# Patient Record
Sex: Female | Born: 1935 | Race: White | Hispanic: No | Smoking: Never smoker
Health system: Southern US, Community
[De-identification: ages and names within clinical notes are randomized; demographics above are authoritative.]

## PROBLEM LIST (undated history)

## (undated) DIAGNOSIS — M1990 Unspecified osteoarthritis, unspecified site: Secondary | ICD-10-CM

## (undated) DIAGNOSIS — E785 Hyperlipidemia, unspecified: Secondary | ICD-10-CM

## (undated) DIAGNOSIS — N1832 Chronic kidney disease, stage 3b: Secondary | ICD-10-CM

## (undated) DIAGNOSIS — E7800 Pure hypercholesterolemia, unspecified: Secondary | ICD-10-CM

## (undated) DIAGNOSIS — E559 Vitamin D deficiency, unspecified: Secondary | ICD-10-CM

## (undated) DIAGNOSIS — I10 Essential (primary) hypertension: Secondary | ICD-10-CM

## (undated) DIAGNOSIS — G479 Sleep disorder, unspecified: Secondary | ICD-10-CM

## (undated) DIAGNOSIS — K219 Gastro-esophageal reflux disease without esophagitis: Secondary | ICD-10-CM

## (undated) DIAGNOSIS — R011 Cardiac murmur, unspecified: Secondary | ICD-10-CM

## (undated) DIAGNOSIS — I350 Nonrheumatic aortic (valve) stenosis: Secondary | ICD-10-CM

## (undated) HISTORY — DX: Chronic kidney disease, stage 3b: N18.32

## (undated) HISTORY — DX: Unspecified osteoarthritis, unspecified site: M19.90

## (undated) HISTORY — PX: BACK SURGERY: SHX140

## (undated) HISTORY — DX: Cardiac murmur, unspecified: R01.1

## (undated) HISTORY — DX: Pure hypercholesterolemia, unspecified: E78.00

## (undated) HISTORY — DX: Sleep disorder, unspecified: G47.9

## (undated) HISTORY — DX: Vitamin D deficiency, unspecified: E55.9

## (undated) HISTORY — DX: Nonrheumatic aortic (valve) stenosis: I35.0

## (undated) HISTORY — PX: SHOULDER SURGERY: SHX246

## (undated) HISTORY — PX: HAND SURGERY: SHX662

## (undated) HISTORY — PX: CARPAL TUNNEL RELEASE: SHX101

---

## 1998-01-26 ENCOUNTER — Encounter: Admission: RE | Admit: 1998-01-26 | Discharge: 1998-04-26 | Payer: Self-pay | Admitting: Anesthesiology

## 1998-05-03 ENCOUNTER — Encounter: Admission: RE | Admit: 1998-05-03 | Discharge: 1998-08-01 | Payer: Self-pay | Admitting: Anesthesiology

## 1998-11-15 ENCOUNTER — Other Ambulatory Visit: Admission: RE | Admit: 1998-11-15 | Discharge: 1998-11-15 | Payer: Self-pay | Admitting: Obstetrics and Gynecology

## 1999-12-05 ENCOUNTER — Other Ambulatory Visit: Admission: RE | Admit: 1999-12-05 | Discharge: 1999-12-05 | Payer: Self-pay | Admitting: Obstetrics and Gynecology

## 1999-12-31 ENCOUNTER — Emergency Department (HOSPITAL_COMMUNITY): Admission: EM | Admit: 1999-12-31 | Discharge: 2000-01-01 | Payer: Self-pay | Admitting: Emergency Medicine

## 2000-11-26 ENCOUNTER — Encounter: Payer: Self-pay | Admitting: Orthopedic Surgery

## 2000-12-01 ENCOUNTER — Inpatient Hospital Stay (HOSPITAL_COMMUNITY): Admission: RE | Admit: 2000-12-01 | Discharge: 2000-12-03 | Payer: Self-pay | Admitting: Orthopedic Surgery

## 2001-01-16 ENCOUNTER — Other Ambulatory Visit: Admission: RE | Admit: 2001-01-16 | Discharge: 2001-01-16 | Payer: Self-pay | Admitting: Obstetrics and Gynecology

## 2002-01-26 ENCOUNTER — Other Ambulatory Visit: Admission: RE | Admit: 2002-01-26 | Discharge: 2002-01-26 | Payer: Self-pay | Admitting: Obstetrics and Gynecology

## 2003-10-07 ENCOUNTER — Encounter: Admission: RE | Admit: 2003-10-07 | Discharge: 2003-10-07 | Payer: Self-pay | Admitting: Orthopedic Surgery

## 2003-10-27 ENCOUNTER — Inpatient Hospital Stay (HOSPITAL_COMMUNITY): Admission: RE | Admit: 2003-10-27 | Discharge: 2003-10-28 | Payer: Self-pay | Admitting: Orthopedic Surgery

## 2004-01-12 ENCOUNTER — Encounter: Admission: RE | Admit: 2004-01-12 | Discharge: 2004-01-12 | Payer: Self-pay | Admitting: Orthopedic Surgery

## 2004-01-31 ENCOUNTER — Encounter: Admission: RE | Admit: 2004-01-31 | Discharge: 2004-01-31 | Payer: Self-pay | Admitting: Orthopedic Surgery

## 2004-02-01 ENCOUNTER — Ambulatory Visit (HOSPITAL_BASED_OUTPATIENT_CLINIC_OR_DEPARTMENT_OTHER): Admission: RE | Admit: 2004-02-01 | Discharge: 2004-02-01 | Payer: Self-pay | Admitting: Orthopedic Surgery

## 2004-02-01 ENCOUNTER — Ambulatory Visit (HOSPITAL_COMMUNITY): Admission: RE | Admit: 2004-02-01 | Discharge: 2004-02-01 | Payer: Self-pay | Admitting: Orthopedic Surgery

## 2004-04-17 ENCOUNTER — Encounter: Admission: RE | Admit: 2004-04-17 | Discharge: 2004-04-17 | Payer: Self-pay | Admitting: Orthopedic Surgery

## 2004-05-10 ENCOUNTER — Inpatient Hospital Stay (HOSPITAL_COMMUNITY): Admission: RE | Admit: 2004-05-10 | Discharge: 2004-05-11 | Payer: Self-pay | Admitting: Orthopedic Surgery

## 2004-09-17 ENCOUNTER — Encounter: Admission: RE | Admit: 2004-09-17 | Discharge: 2004-09-17 | Payer: Self-pay | Admitting: Orthopedic Surgery

## 2004-12-19 ENCOUNTER — Observation Stay (HOSPITAL_COMMUNITY): Admission: RE | Admit: 2004-12-19 | Discharge: 2004-12-20 | Payer: Self-pay | Admitting: Orthopedic Surgery

## 2005-03-15 ENCOUNTER — Encounter: Admission: RE | Admit: 2005-03-15 | Discharge: 2005-03-15 | Payer: Self-pay | Admitting: Orthopedic Surgery

## 2005-04-27 HISTORY — PX: HIP SURGERY: SHX245

## 2005-08-06 ENCOUNTER — Ambulatory Visit (HOSPITAL_COMMUNITY): Admission: RE | Admit: 2005-08-06 | Discharge: 2005-08-07 | Payer: Self-pay | Admitting: Orthopedic Surgery

## 2005-09-06 ENCOUNTER — Ambulatory Visit (HOSPITAL_COMMUNITY): Admission: RE | Admit: 2005-09-06 | Discharge: 2005-09-06 | Payer: Self-pay | Admitting: Orthopedic Surgery

## 2005-12-10 ENCOUNTER — Inpatient Hospital Stay (HOSPITAL_COMMUNITY): Admission: RE | Admit: 2005-12-10 | Discharge: 2005-12-13 | Payer: Self-pay | Admitting: Orthopedic Surgery

## 2005-12-10 ENCOUNTER — Ambulatory Visit: Payer: Self-pay | Admitting: Physical Medicine & Rehabilitation

## 2005-12-13 ENCOUNTER — Inpatient Hospital Stay
Admission: RE | Admit: 2005-12-13 | Discharge: 2005-12-19 | Payer: Self-pay | Admitting: Physical Medicine & Rehabilitation

## 2006-06-17 ENCOUNTER — Encounter: Admission: RE | Admit: 2006-06-17 | Discharge: 2006-06-17 | Payer: Self-pay | Admitting: Orthopedic Surgery

## 2006-07-30 ENCOUNTER — Ambulatory Visit (HOSPITAL_COMMUNITY): Admission: RE | Admit: 2006-07-30 | Discharge: 2006-07-31 | Payer: Self-pay | Admitting: Orthopedic Surgery

## 2006-12-08 ENCOUNTER — Encounter: Admission: RE | Admit: 2006-12-08 | Discharge: 2006-12-08 | Payer: Self-pay | Admitting: Family Medicine

## 2006-12-08 ENCOUNTER — Encounter (INDEPENDENT_AMBULATORY_CARE_PROVIDER_SITE_OTHER): Payer: Self-pay | Admitting: Specialist

## 2006-12-08 ENCOUNTER — Encounter (INDEPENDENT_AMBULATORY_CARE_PROVIDER_SITE_OTHER): Payer: Self-pay | Admitting: *Deleted

## 2006-12-18 ENCOUNTER — Inpatient Hospital Stay (HOSPITAL_COMMUNITY): Admission: RE | Admit: 2006-12-18 | Discharge: 2006-12-24 | Payer: Self-pay | Admitting: Orthopedic Surgery

## 2006-12-19 ENCOUNTER — Ambulatory Visit: Payer: Self-pay | Admitting: Physical Medicine & Rehabilitation

## 2007-06-26 ENCOUNTER — Ambulatory Visit (HOSPITAL_BASED_OUTPATIENT_CLINIC_OR_DEPARTMENT_OTHER): Admission: RE | Admit: 2007-06-26 | Discharge: 2007-06-26 | Payer: Self-pay | Admitting: Orthopedic Surgery

## 2007-09-15 ENCOUNTER — Encounter: Admission: RE | Admit: 2007-09-15 | Discharge: 2007-09-15 | Payer: Self-pay | Admitting: Orthopedic Surgery

## 2008-06-01 ENCOUNTER — Inpatient Hospital Stay (HOSPITAL_COMMUNITY): Admission: RE | Admit: 2008-06-01 | Discharge: 2008-06-04 | Payer: Self-pay | Admitting: Orthopedic Surgery

## 2008-07-18 ENCOUNTER — Encounter: Admission: RE | Admit: 2008-07-18 | Discharge: 2008-07-18 | Payer: Self-pay | Admitting: Orthopedic Surgery

## 2009-08-03 ENCOUNTER — Ambulatory Visit (HOSPITAL_COMMUNITY): Admission: RE | Admit: 2009-08-03 | Discharge: 2009-08-05 | Payer: Self-pay | Admitting: Orthopedic Surgery

## 2010-02-09 ENCOUNTER — Encounter: Admission: RE | Admit: 2010-02-09 | Discharge: 2010-02-09 | Payer: Self-pay | Admitting: Orthopedic Surgery

## 2010-11-17 ENCOUNTER — Encounter: Payer: Self-pay | Admitting: Orthopedic Surgery

## 2010-11-18 ENCOUNTER — Encounter: Payer: Self-pay | Admitting: Family Medicine

## 2011-02-01 LAB — CBC
HCT: 35.4 % — ABNORMAL LOW (ref 36.0–46.0)
Hemoglobin: 11.7 g/dL — ABNORMAL LOW (ref 12.0–15.0)
MCHC: 33.1 g/dL (ref 30.0–36.0)
MCV: 90.6 fL (ref 78.0–100.0)
RBC: 3.91 MIL/uL (ref 3.87–5.11)
RDW: 14.2 % (ref 11.5–15.5)

## 2011-02-01 LAB — BASIC METABOLIC PANEL
CO2: 29 mEq/L (ref 19–32)
Calcium: 9.8 mg/dL (ref 8.4–10.5)
Chloride: 105 mEq/L (ref 96–112)
Glucose, Bld: 115 mg/dL — ABNORMAL HIGH (ref 70–99)
Potassium: 4.7 mEq/L (ref 3.5–5.1)
Sodium: 141 mEq/L (ref 135–145)

## 2011-03-12 NOTE — Op Note (Signed)
[name], [name]               ACCOUNT NO.:  [id number]   MEDICAL RECORD NO.:  [id number]          PATIENT TYPE:  INP   LOCATION:  0003                         FACILITY:  [facility]   PHYSICIAN:  Marlowe Kays, M.D.  DATE OF BIRTH:  [birth year]   DATE OF PROCEDURE:  06/01/2008  DATE OF DISCHARGE:                               OPERATIVE REPORT   PREOPERATIVE DIAGNOSES:  1. Central and foraminal stenosis, L2-3, L3-4, L4-5.  2. Possible disk herniations, L2-3 and L4-5.   POSTOPERATIVE DIAGNOSES:  1. Central and foraminal stenosis, L2-3, L3-4, L4-5.  2. No definite disk herniation identified.   OPERATION:  Central and foraminal decompression, L2-3, L3-4, L4-5, with  inspection of the L2-3 and L4-5 disk on the left.   SURGEON:  Dr. Simonne Come.   ASSISTANT:  Dr. Worthy Rancher.   ANESTHESIA:  General.   PATHOLOGY/JUSTIFICATION FOR PROCEDURE:  She has had bilateral total  hips.  She is having bilateral leg pain out, emanating from her back  with an MRI in November of last year demonstrating multifactorial spinal  stenosis at 3 levels, demonstrated particularly at L4-5, with possible  disk herniations at L2-3 and L4-5.   PROCEDURE:  Prophylactic antibiotics, satisfactory general anesthesia,  Foley catheter inserted carefully protecting the total hips, placed in  the prone position on the Wilson frame.  Back was prepped with DuraPrep,  draped in sterile field, Ioban employed, time out performed.  I made a  vertical midline incision and tagged it to adjacent spinous processes in  the mid lumbar area with Kocher clamps.  Initial lateral x-ray  demonstrated these were on the spinous process of L2 and L3.  Based on  this, I extended the incision distal ward and dissected soft tissue off  the lamina of L2 to L5.  I then placed self-retaining McCullough  retractors and began removing the spinous processes and neural arches at  the these levels.  I then used 2- and 3-mm Kerrison rongeurs to  remove a  substantial amount of residual bone and ligamentum flavum and then  brought in the microscope to complete the decompression from L2 to L5.  The spinal canal was widely patent to hockey-stick both proximally and  distally, and all the foramina were patent.  We then looked at the L2-3  disk on the left, removing some additional lateral bone.  Left side was  more symptomatic than her right.  The disk appeared firm to palpation  with the [facility] 4 instrument.  The neural foramen was widely patent  for the L3 nerve root.  Distally, we thoroughly exposed the L5 nerve  root and performed a wide foraminotomy.  The dura was very intimate to  the surrounding lateral tissue, and we were fearful of creating a dural  tear with vigorous attempts to expose the L4-5 disk.  We identified the  correct level with a third x-ray.  It was felt that the dura and the  foramina were well decompressed, and not wishing to create any  iatrogenic problems, we called it a day at this point.  The wound was  thoroughly irrigated  with sterile saline and Gelfoam soaked in thrombin  was placed over the dura.  Then placed a quarter-inch Penrose drain from  the right posterior flank, up the buttock area, into the wound overlying  the dura and Gelfoam.  We then closed the wound in layers working under  direct visualization around the drain with interrupted #1 Vicryl in the  paralumbar muscle and fascia, 2-0 Vicryl in  the subcutaneous tissue and staples in the skin.  Betadine, Adaptic, dry  sterile dressing were applied.  She tolerated the procedure well and was  taken to recovery in satisfactory condition with no known complications.  Estimated blood loss was 225 mL, no blood replacement.           ______________________________  Marlowe Kays, M.D.     JA/MEDQ  D:  06/01/2008  T:  06/01/2008  Job:  [id number]

## 2011-03-12 NOTE — Op Note (Signed)
[name], [name]               ACCOUNT NO.:  [id number]   MEDICAL RECORD NO.:  [id number]          PATIENT TYPE:  AMB   LOCATION:  NESC                         FACILITY:  [facility]   PHYSICIAN:  Marlowe Kays, M.D.  DATE OF BIRTH:  03/30/1927   DATE OF PROCEDURE:  06/26/2007  DATE OF DISCHARGE:                               OPERATIVE REPORT   PREOPERATIVE DIAGNOSIS:  Right carpal tunnel syndrome.   POSTOPERATIVE DIAGNOSIS:  Right carpal tunnel syndrome.   OPERATION:  Decompression median nerve, right wrist and hand.   SURGEON:  Marlowe Kays, M.D.   ASSISTANT:  Nurse.   ANESTHESIA:  IV regional supplemented by local Xylocaine.   JUSTIFICATION FOR PROCEDURE:  She has signs and symptoms of carpal  tunnel syndrome with positive nerve conduction studies for it. She has  had successful carpal tunnel release by me on the left side years ago.   PROCEDURE:  After satisfactory regional anesthesia, which I had  supplemented with some local 1% Xylocaine, the right upper extremity was  prepped with DuraPrep from the mid forearm to the fingertips and draped  in sterile field.  A time out was performed.  I marked out a curved  incision along the base of thenar eminence crossing obliquely over the  flexor crease of the wrist in the distal forearm.  The palmaris longus  tendon was identified and retracted to the radial side.  Beneath it, the  median nerve was identified. It was quite bulbous.  I carefully released  the skin and subcutaneous tissue to very thick fascia in the mid palm,  working down into the distal palm, releasing fibrous bands on some of  several of the nerve branches in the distal palm.  The potential  bleeders were coagulated with bipolar cautery.  At the conclusion of the  case, the wound was irrigated well with sterile saline and skin and  subcutaneous tissue only closed with interrupted 4-0 nylon mattress  sutures.  Betadine, Adaptic, and dry, sterile dressing were  applied  followed by a volar plaster splint.  She tolerated the procedure well  and was taken to the recovery room in satisfactory condition with no  known complications.           ______________________________  Marlowe Kays, M.D.     JA/MEDQ  D:  06/26/2007  T:  06/27/2007  Job:  [id number]

## 2011-03-12 NOTE — H&P (Signed)
[name], [name]               ACCOUNT NO.:  [id number]   MEDICAL RECORD NO.:  [id number]         PATIENT TYPE:  LINP   LOCATION:                               FACILITY:  [facility]   PHYSICIAN:  Marlowe Kays, M.D.  DATE OF BIRTH:  Dec 28, 1926   DATE OF ADMISSION:  06/01/2008  DATE OF DISCHARGE:                              HISTORY & PHYSICAL   CHIEF COMPLAINT:  Pain in my back and legs.   PRESENT ILLNESS:  This is a 77 year old white female seen by [place] for  continuing problems concerning pain into her lumbar spine.  She has  radiation into the lower extremities which varies from right leg to left  leg.  Standing for any period of time markedly increases her pain and  discomfort, and now it is interfering with her sleep patterns due to the  pain.  She has to get up and walk around from time to time due to the  pain in her back.  We have tried to treat her conservatively with rest  and some mild exercises, but it was felt that we should go straight  ahead to an MRI to rule out any marked discopathy or stenosis.  Unfortunately the MRI showed severe multifactorial spinal stenosis at L4-  L5, L2-L3 with a disk protrusion and narrowing of the lateral recess,  more so on the left than right.  She had facet arthropathy at L5 with  narrowing of the subarticular lateral recess.  Please see MRI report for  further details.  After much discussion including the risks and benefits  of surgery, it is felt she would benefit from surgical intervention and  is scheduled for a decompressive lumbar laminectomy from L2-L5 with  possible microdiskectomy L2-L3, L4-L5.   PAST MEDICAL HISTORY:  The patient is being treated for hypertension,  hypercholesterolemia.   PAST SURGICAL EXPERIENCES:  1. Two hand surgeries.  2. Cholecystectomy.  3. Bilateral hip replacements.  4. Several surgeries on her shoulders for rotator cuff problems.   Dr. Tenny Craw at [place], [employer] is her family physician.   SHE  IS ALLERGIC TO CODEINE, HAS NO FOOD, LATEX OR METAL ALLERGIES.   CURRENT MEDICATIONS:  1. Zocor 20  mg daily.  2. Norvasc 5 mg daily.  3. Diovan 160/12.5 mg daily.   FAMILY HISTORY:  Positive for father dying of a heart attack at 76 years  of age and mother with lung cancer at 71 years of age.  Two siblings  have expired secondary to heart attack.   SOCIAL HISTORY:  The patient is widowed.  She never had any intake of  tobacco products, has no alcohol products at the current time.  The  patient lives alone, but has family support and a friend who will help  her after surgery.   REVIEW OF SYSTEMS:  CNS: No seizures, stroke or paralysis, numbness,  double vision.  RESPIRATORY:  No productive cough, no hemoptysis, no  shortness of breath.  CARDIOVASCULAR: No chest pain, angina or  orthopnea.  GASTROINTESTINAL:  No nausea, vomiting, melena or bloody  stool.  GENITOURINARY:  No discharge,  dysuria or hematuria.  MUSCULOSKELETAL:  Primarily in present illness with her back and legs.   PHYSICAL EXAMINATION:  A cooperative and friendly 77 year old white  female whose vital signs are:  Blood pressure 122/64, pulse 72 regular,  respirations 12 unlabored.  HEENT: Normocephalic.  Wears glasses.  PERRLA, EOM intact.  Oropharynx  is clear.  CHEST:  Clear to auscultation.  No rhonchi, rales or wheezes.  HEART:  Regular rate and rhythm.  No murmurs are heard.  ABDOMEN: Soft, nontender, spleen not felt.  Bowel sounds present.  GENITALIA/RECTAL/PELVIC/BREASTS:  Not done.  Not pertinent to present  illness.  EXTREMITIES:  Negative straight leg raise bilaterally and sensory is  grossly intact to lower extremities.   ADMISSION DIAGNOSES:  1. Spinal stenosis L2-L3, L3-L4, L4-L5 with HNP at L2-L3, L4-L5.  2. Hypertension.  3. Hypercholesterolemia.   PLAN:  The patient will undergo decompressive lumbar laminectomy from L2-  L5 with possible microdiskectomy in L2-L3, L4-L5.  In all probability  she  will be able to go home with home physical therapy and assistance  after her regular hospitalization.      Dooley L. Cherlynn June.    ______________________________  Marlowe Kays, M.D.    DLU/MEDQ  D:  05/19/2008  T:  05/19/2008  Job:  [id number]   cc:   Marlowe Kays, M.D.  Fax: [phone]   C. Duane Lope, M.D.  Fax: [phone]

## 2011-03-15 NOTE — Op Note (Signed)
[name], [name]               ACCOUNT NO.:  [id number]   MEDICAL RECORD NO.:  [id number]          PATIENT TYPE:  INP   LOCATION:  1610                         FACILITY:  [facility]   PHYSICIAN:  Marlowe Kays, M.D.  DATE OF BIRTH:  08-16-1927   DATE OF PROCEDURE:  12/18/2006  DATE OF DISCHARGE:                               OPERATIVE REPORT   PREOPERATIVE DIAGNOSIS:  Osteoarthritis right hip.   POSTOPERATIVE DIAGNOSIS:  Osteoarthritis right hip.   OPERATION:  Osteonics total hip replacement, right.   SURGEON:  Dr. Simonne Come   ASSISTANT:  Dr. Worthy Rancher.   ANESTHESIA:  General.   PATHOLOGY AND JUSTIFICATION FOR PROCEDURE:  She has had a total hip on  the left, and this is doing well.  She has had progressive  osteoarthritic change in the right hip with debilitating pain and  consequently is here today for the above-mentioned surgery.   PROCEDURE:  Prophylactic antibiotic, satisfactory general anesthesia,  Foley catheter inserted, left lateral decubitus position on the Aurora II  frame.  Right hip was prepped with DuraPrep, draped in a sterile field,  Ioban employed.  Posterolateral incision down to the fascia lata.  Greater trochanteric bursal tissue was excised.  Zickel's band was cut.  External rotators were detached from the lateral femur and the femoral  head exposed.  The hip was dislocated, and I amputated the femoral neck  right below the femoral head.  I then cleared the piriformis fossa of  soft tissue placed a guidepin down through it into the femoral canal.  I  then over drilled the guide pin and began the vertical reaming process  working up to #8 which is the size she had on her left side.  Along the  way, I used the greater trochanteric reamer, and I also made my final  cut on the femoral neck 1 fingerbreadth above the lesser trochanter.  I  then began the rasping process which likewise went up 2 and the 8.  I  then reamed for the distal tip for the femoral  component going up to a  14 mm size.  Despite her age of 32, she had excellent cortical bone, and  I felt the noncemented stem would be appropriate and had clearly worked  well on the contralateral side.  I then used the gold broach for the  final preparation for the Press-Fit size 8 femoral component.  I then  went to the acetabulum, clearing it of soft tissue and capsule around  the perimeter.  I then began deepening and expanding reaming up to a  size 56.  She had a 54 on the left side.  This seemed to be the best  fit.  I went through a trial reduction with good position of the cup and  consequently placed the final cup, stabilizing with 2 screws then went  through another trial reduction with a trial insert with excellent range  of motion and placed a final 54, 56 insert for a 36 mm head.  The size 8  Press-Fit femoral component was then inserted and 2 trial reductions  with  a +0 and +5 neck indicating that the +5 was the best fit with no  toggle and nice stability in all directions.  Consequently the final +5  femoral head and neck was placed, the hip reduced, found to be stable.  The wound was then closed after irrigating the wound well.  The Zickel's  band was reattached with interrupted #1 Vicryl.  The external rotators,  fascia lata with the same,  subcutaneous tissue with a combination of 0 and 2-0 Vicryl, staples in  the skin.  Betadine, Adaptic dry sterile dressing were applied.  She was  gently placed on her back in an abduction pillow and taken to recovery  in satisfactory condition with no known complications.  EBL was perhaps  300 mL, no blood replacement.           ______________________________  Marlowe Kays, M.D.     JA/MEDQ  D:  12/18/2006  T:  12/18/2006  Job:  [id number]

## 2011-03-15 NOTE — Discharge Summary (Signed)
[name], [name]               ACCOUNT NO.:  [id number]   MEDICAL RECORD NO.:  [id number]          PATIENT TYPE:  INP   LOCATION:  1612                         FACILITY:  [facility]   PHYSICIAN:  Marlowe Kays, M.D.  DATE OF BIRTH:  04-20-1927   DATE OF ADMISSION:  06/01/2008  DATE OF DISCHARGE:  06/04/2008                               DISCHARGE SUMMARY   ADMITTING DIAGNOSES:  1. Spinal stenosis L2-L3, L3-L4, L4-L5 with herniated nucleus pulposus      at L2-L3 and L4-L5.  2. Hypertension.  3. Hypercholesterolemia.   DISCHARGE DIAGNOSES:  1. Central and foraminal stenosis L2-L3, L3-L4, L4-L5 with no definite      disk herniation identified.  2. Hypertension.  3. Hypercholesterolemia.   OPERATION:  On June 01, 2008 the patient underwent central foraminal  decompression L2-L3, L3-L4, L4-L5 with inspection of the L2-L3 and L4-5  disk on the left.  Dr. Ranee Gosselin assisted.   BRIEF HISTORY:  This 77 year old white female well known to [place] has had  progressive problems concerning pain into her back to with radiation to  lower extremities.  Standing increase her pain and discomfort.  She has  difficulty sleeping due to the fact she has to get up and walk around  from time to time.  Exercises it had were given with a very brittle  results and MRI showed the above pathology.  After much discussion  including risks and benefits of surgery she was scheduled for the above  procedure.   COURSE IN THE HOSPITAL:  The patient tolerated surgical procedure quite  well.  She had a mild syncopal episode postop with physical therapy.  She did have a drop in hemoglobin but was not to the point where she  needed transfusion.  She was 11.8 preop and 8.6 postop.  She eventually  had no more syncopal episodes and we attributed it to some of her pain  medications and anesthesia or the postop anesthesia effects.  She worked  diligently with physical therapy the drain which was placed in her back  at the time of surgery was advanced on the first postop day and then  DC'd on the second postop day.  She was doing quite well on the day of  discharge, had markedly less pain in her lower extremities had a mild  temperature but was using pulmonary assist.  She was felt that she could  be maintained in her home environment.  She had minimal drainage to her  back and was neurovascular intact in lower extremities as she had as  well very little leg discomfort.   LABORATORY VALUES IN THE HOSPITAL:  Showed a preoperative H&H of 11.8,  hematocrit was 34.6 on August 07, hemoglobin was 8.6 matter of 25.6.  Electrocardiogram showed sinus rhythm with premature supraventricular  complexes.  Chest x-ray showed no acute disease.   CONDITION ON DISCHARGE:  Improved, stable.   PLAN:  The patient discharged to home care of her family.  Continue with  diet as tolerated at home and can ambulate ad lib with and without  walker.  Use dry dressings  to back as needed.  Avoid any bending or  twisting.  She is given Demerol tabs for discomfort, Robaxin as a muscle  relaxant.  Return to see [place] about 2 weeks after date of surgery.  She is  encouraged to call us should she have any problems or questions while at  home.      Dooley L. Cherlynn June.    ______________________________  Marlowe Kays, M.D.    DLU/MEDQ  D:  06/22/2008  T:  06/22/2008  Job:  [id number]   cc:   C. Duane Lope, M.D.  Fax: [phone]   Marlowe Kays, M.D.  Fax: [phone]

## 2011-03-15 NOTE — Op Note (Signed)
[name], [name]               ACCOUNT NO.:  [id number]   MEDICAL RECORD NO.:  [id number]          PATIENT TYPE:  INP   LOCATION:  1509                         [facility]ACILITY:  [facility]   PHYSICIAN:  Marlowe Kays, M.D.  DATE OF BIRTH:  [birth year]   DATE OF PROCEDURE:  12/10/2005  DATE OF DISCHARGE:                                 OPERATIVE REPORT   PREOPERATIVE DIAGNOSIS:  Osteoarthritis, left hip.   POSTOPERATIVE DIAGNOSIS:  Osteoarthritis, left hip.   OPERATION:  Osteonics total hip replacement, left.   SURGEON:  Marlowe Kays, M.D.   ASSISTANT:  Georges Lynch. Darrelyn Hillock, M.D.   ANESTHESIA:  General.   PATHOLOGY AND JUSTIFICATION FOR PROCEDURE:  Both hips looked fairly  identical with an oblong femoral head and oblong acetabulum. Only her left  hip was painful particularly with no superior joint space remaining. She has  failed to respond to nonsurgical treatment.   DESCRIPTION OF PROCEDURE:  Prophylactic antibiotics, satisfactory general  anesthesia, Foley catheter inserted, right lateral decubitus position with  the left hip prepped with DuraPrep, draped in a sterile field.  Posterolateral incision after application of Ioban was sent down through the  fascia lata which was distorted because of a chronic trochanteric bursitis.  A good bit of thick bursal tissue was excised. She had bony deformities of  the greater trochanter which I also smoothed down with a rongeur. The  external rotators were detached from the femur and hip capsule identified  and a subtotal capsulectomy performed. After dislocating the hip, I  amputated the femoral neck below the femoral head and then finished clearing  the piriformis fossa of soft tissue. I placed a guidepin down through it  into the femur and over drilled it followed by the canal finder. I then  began the cylindrical reaming process. She was age 77 but both  radiographically and at surgery had excellent bone stock and both Dr.  Darrelyn Hillock and  I felt that her bone was too good to warrant a cemented femoral  component. Accordingly we reamed thinking in terms of the Press-Fit. We used  the greater trochanteric reamer and working our way up to a #8 cylindrical  reamer made our final definitive cut on the femoral neck. I then continued  with the rasping up to a #8. At this point, there no cancellus bone  remaining on the calcar. I could recess the 7 rasp bit the 8 rasp I could  not completely bring 100% onto the calcar. We felt this was an excellent  fit. I then reamed for the 12 mm distal femoral tip up to a 12.5.  I went  through a trial reduction with the gold broach with no problem.  We then  went to the acetabulum where I completed the capsulectomy. She had a very  deep foramen Ovale as well as an eccentric acetabulum requiring a good bit  of deepening to the level of the acetabular wall and then some differential  reaming medially to make a more spherical acetabulum. We worked our way up  to 52 reamer and then utilized a 54 trial finding  this to fit nicely. After  going through a trial reduction, we went ahead and placed a final 54 Trident  hemispherical acetabular shell which I stabilized with two screws which  individually drilled, measured and screwed. We then placed the final 10  degree acetabular liner set at about 2 o'clock. I then inserted the final  SecureFit 127 degree size 8 femoral component and went through trial  reductions with it finding +0 was the best fit in terms of tightness and  stability on rotation, it was very stable. When the final 36 mm C tapered  head was placed, the hip was reduced and found to be nice and stable. I then  irrigated the wound well with sterile saline and reattached the external  rotators with interrupted #1 Vicryl followed by the anomalous appearing  fascia lata. The subcutaneous tissue was closed with 2-0 Vicryl, skin with  staples. Betadine Adaptic dry sterile dressing were applied.  She was gently  placed on her back in an abduction pillow. Leg lengths appeared to be  roughly equal, foot was up, and she was dorsiflexing her foot. She was taken  to the recovery room in satisfactory condition with no known complications.  Estimated blood loss was 400 mL, no blood replacement.           ______________________________  Marlowe Kays, M.D.     JA/MEDQ  D:  12/10/2005  T:  12/11/2005  Job:  [id number]

## 2011-03-15 NOTE — Op Note (Signed)
NAMEJACE, [name]               ACCOUNT NO.:  [id number]   MEDICAL RECORD NO.:  [id number]          PATIENT TYPE:  AMB   LOCATION:  DAY                          FACILITY:  [facility]   PHYSICIAN:  Marlowe Kays, M.D.  DATE OF BIRTH:  [birth year]   DATE OF PROCEDURE:  07/30/2006  DATE OF DISCHARGE:                                 OPERATIVE REPORT   PREOPERATIVE DIAGNOSIS:  Recurrent rotator cuff tear, right shoulder.   POSTOPERATIVE DIAGNOSIS:  Recurrent rotator cuff tear, right shoulder.   OPERATION:  Anterior acromionectomy with repair of recurrent rotator cuff  tear, right shoulder.   SURGEON:  Marlowe Kays, M.D.   ASSISTANTDruscilla Brownie. Idolina Primer, P.A.-C.   ANESTHESIA:  General.   JUSTIFICATION FOR PROCEDURE:  She has had two prior rotator cuff surgeries,  the last being on August 06, 2005, at which time I repaired an extensive  rotator cuff tear and she had done well until July of this year when she was  in physical therapy trying get some additional strength to her shoulder and  noted pain and bruising which failed to resolve.  Consequently, I sent her  for an arthrogram which was performed on June 17, 2006, and showed a large  recurrent rotator cuff tear.  The arthrogram had leakage of dye which  appeared to be beneath the acromion.  With the bruising noted, I told her  this was a little unusual for a rotator cuff tear and probably indicated  muscle involvement which is what we found at surgery.   PROCEDURE:  Prophylactic antibiotic, satisfactory general anesthesia, beach-  chair position, the right shoulder was prepped with DuraPrep and draped in a  sterile field, Ioban employed.  I went through the old surgical incision  carefully with soft tissue dissection to minimize risk to the rotator cuff,  particularly since she had had so much acromion removed.  I found the  anterior leading edge of the acromion and undermined this carefully with a  small Cobb elevator.  The rotator cuff was quite adherent to the under  surface and a tear was located just distal to the anterior leading edge and  up underneath the acromion.  There was a small reactive spur of bone present  which may have been a contributing factor, but it appeared analyzing the  situation, that she had had adherence of her rotator cuff muscle to the  under surface of the anterior acromion. On stressing this, this had created  the tear.  The remainder of the rotator cuff appeared to be intact, although  I did not extensively dissect out the interface between rotator cuff and  overlying tissues because this was quite dense and I felt could very well  have created iatrogenic damage.  With the tear, we sought surgery being  right where the arthrogram had indicated, we felt that this was the source  for the tear.  After carefully dissecting off the rotator cuff from the  under surface of the acromion, I used the micro-saw to remove more inferior  bone and gave a wide clearance for the rotator cuff.  I then repaired the  rotator cuff with multiple side-to-side stitches of #1 Ethibond.  The repair  way up beneath the anterior acromion appeared to be nice and stable.  The  wound was then irrigated well with sterile saline.  I placed bone wax on the  under surface of the acromion, infiltrated the soft tissues with 0.5%  Marcaine with adrenaline.  I then closed the fascia with interrupted  #1 Vicryl, subcutaneous tissue with 2-0 Vicryl, Steri-Strips in the skin.  A  dry sterile dressing and shoulder immobilizer were applied.  She tolerated  the procedure well and was taken to the recovery room in satisfactory  condition with no known complications.           ______________________________  Marlowe Kays, M.D.     JA/MEDQ  D:  07/30/2006  T:  07/31/2006  Job:  [id number]

## 2011-03-15 NOTE — H&P (Signed)
[name], [name]               ACCOUNT NO.:  [id number]   MEDICAL RECORD NO.:  [id number]          PATIENT TYPE:  INP   LOCATION:  NA                           FACILITY:  [facility]   PHYSICIAN:  Marlowe Kays, M.D.  DATE OF BIRTH:  [birth year]   DATE OF ADMISSION:  12/18/2006  DATE OF DISCHARGE:                              HISTORY & PHYSICAL   CHIEF COMPLAINT:  Pain in my right hip.   PRESENT ILLNESS:  This 77 year old white female successfully underwent a  left total hip replacement arthroplasty on December 10, 2005.  She has  done well since that surgery and now, as she is progressing along post  rehabilitation to the left hip, she is having increasing pain into the  right hip with pain, discomfort and noticing that her day-to-day  activities have markedly been interfered with to the point where she is  now quite desperate, has to walk with a cane and highly desirous for  right hip arthroplasty.  Dr. Simonne Come has discussed the risks and  benefits of surgery concerning this procedure.   She has been cleared preoperatively by Dr. Vianne Bulls of [facility] at [facility].  Her prior physicians has been Dr. Foye Deer of that practice but he is now retired.  According to her, Dr.  Tenny Craw has taken over her care.   The patient currently is being worked up for a lesion in her left  breast.  She has had a needle biopsy and plans to have another one.  Hopefully that will do well.  Really cannot see at this point in time  what may interfere with her surgery.   CURRENT MEDICATIONS:  1. Diovan 160/125 mg daily.  2. Norvasc 5 mg daily.  3. Zocor 20 mg daily.  4. Premarin 0.65 mg daily.  5. Provera 5 mg daily.  6. Aspirin 81 daily.  7. She will stop the aspirin, Provera and Premarin prior to admission.  8. I wrote a prescription today for Darvocet N 100 for discomfort.   ALLERGIES:  She has intolerance to CODEINE which causes nausea.   Again, Dr. Vianne Bulls of  [facility] and [facility] is  her medical physician.  He is treating her for hypertension,  hypercholesteremia.   PAST SURGICAL HISTORY:  1. Cholecystectomy in 1984.  2. Hand surgery in 1974.  3. Several shoulder surgeries for impingement and rotator cuff repairs      in the past by Dr. Simonne Come.  4. Total hip replacement arthroplasty of the left hip in February      2007.   FAMILY HISTORY:  Positive for heart disease in her father and brothers,  hypertension in her sisters.  Mother died of lung cancer and brother has  had a stroke.   SOCIAL HISTORY:  She is widowed.  She is an Haematologist.  No intake of alcohol or tobacco products.  She has one son who will be  able to help her along with home health after her surgery.   REVIEW OF SYSTEMS:  CNS: No seizures or paralysis,  numbness, double  vision.  RESPIRATORY:  No productive cough, no hemoptysis, no shortness  of breath.  CARDIOVASCULAR:  No chest pain, no angina, no orthopnea.  GASTROINTESTINAL: No nausea, vomiting, melena or bloody stool.  GENITOURINARY:  No discharge, dysuria or hematuria.  MUSCULOSKELETAL:  Primarily in present illness.   PHYSICAL EXAMINATION:  GENERAL APPEARANCE:  Alert, cooperative, friendly  76 year old somewhat anxious female who walks with a cane held in her  left hand.  VITAL SIGNS:  Blood pressure 120/78, pulse 84 regular, respirations 12  unlabored.  HEENT:  Normocephalic PERRLA.  Pupils somewhat small, oropharynx is  clear.  CHEST:  Clear to auscultation, no rhonchi or rales.  No wheezes.  HEART:  Regular rate and rhythm.  No murmurs are heard.  ABDOMEN:  Soft, nontender, liver or spleen not felt.  GENITALIA/RECTAL/PELVIC/BREASTS:  Not done, not pertinent to present  illness.  EXTREMITIES:  The patient has painful range of motion of the right hip,  particularly on internal and external rotation which is minimal.   ADMISSION DIAGNOSES:  1. End-stage osteoarthritis  of right hip.  2. Hypertension.  3. Hypercholesterolemia.   PLAN:  The patient will undergo right total hip replacement  arthroplasty.  We will have home health with Genevieve Norlander and according to  her, someone will be able to help her at home.  Should we have any  medical problems, we will certainly contact Dr. Tenny Craw or one of his  associates to follow along with [place] during this patient's  hospitalization.      [name].    ______________________________  Marlowe Kays, M.D.    DLU/MEDQ  D:  12/10/2006  T:  12/10/2006  Job:  [id number]   cc:   C. Duane Lope, M.D.  Fax: [phone]

## 2011-03-15 NOTE — Op Note (Signed)
NAMEDOMITILA, STETLER               ACCOUNT NO.:  [id number]   MEDICAL RECORD NO.:  [id number]          PATIENT TYPE:  AMB   LOCATION:  DAY                          FACILITY:  [facility]   PHYSICIAN:  Marlowe Kays, M.D.  DATE OF BIRTH:  1927-09-24   DATE OF PROCEDURE:  12/19/2004  DATE OF DISCHARGE:                                 OPERATIVE REPORT   PREOPERATIVE DIAGNOSIS:  Recurrent rotator cuff tear, right shoulder.   POSTOPERATIVE DIAGNOSIS:  Recurrent rotator cuff tear, right shoulder.   OPERATION:  Anterior acromionectomy and repair of recurrent rotator cuff  tear, right shoulder.   SURGEON:  Dr. Simonne Come.   ASSISTANT:  Dooley L. Underwood, P.A.-C.   ANESTHESIA:  General.   JUSTIFICATION OF PROCEDURE:  I repaired her rotator cuff, right shoulder  many years ago. Because of progressive pain, we had an arthrogram performed  on September 17, 2004 which showed a large recurrent tear.  She has had  progressive pain and accordingly, is here today for the above-mentioned  surgery.   PROCEDURE:  Prophylactic antibiotics, satisfactory general anesthesia, beach  chair position on the supine frame.  The right shoulder girdle was prepped  with DuraPrep and draped in a sterile field.  Ioban employed.  I went  through the old surgical incision vertically.  Locating the residual  acromion, anterior acromion and with mainly Bovie dissection, I dissected  the fibrous tissue off and placed a small Cobb elevator beneath the residual  acromion.  Joint fluid came forth, confirming the diagnosis.  I made my  initial anterior acromionectomy with micro saw and a small rongeur.  Subsequently, had to perform additional decompression.  I also resected  fibrous tissue off the anterior acromion back to the acromioclavicular joint  for exposure.  She had a complex rotator cuff tear which was large.  The  biceps tendon was intact.  It was centered in the middle of the tear.  There  was a large retracted  flap posterior to this and anteriorly, an anterior  flap.  After assessing the pathology, I first re-attached the posterior flap  using two super Mitek anchors after abrading the greater tuberosity area.  I  then used an additional Mitek anchor, super Mitek anchor to re-attach the  anterior flap and in addition, used one arm of the anterior two anchors to  attach to the adjacent flap.  I then further stabilized the flaps by  suturing them at their insertion with interrupted #1 Ethibond and between  the two with #1 Ethibond and some #1 Vicryl.  At the conclusion of the case,  there was no residual impingement and the repair seemed to be nice and solid  with her arm to her side.  The wound was irrigated with some sterile saline.  She was given 15 mg of Toradol IV and the wound was infiltrated with 0.5%  Marcaine with Adrenalin.  I then reattached the deltoid to the residual  acromion through four drill holes with interrupted #1 Vicryl suture and the  split deltoid muscle with the same.  I then completed the closure with a  combination of #1 and 2-0  Vicryl in the subcutaneous tissue and Steri-Strips on the skin.  Dry sterile  dressing, shoulder immobilizer were applied.  She tolerated the procedure  well and was taken to the recovery room in satisfactory condition with no  known complications.      JA/MEDQ  D:  12/19/2004  T:  12/19/2004  Job:  [id number]

## 2011-03-15 NOTE — Op Note (Signed)
NAME:  [name], [name]                         ACCOUNT NO.:  [id number]   MEDICAL RECORD NO.:  [id number]                   PATIENT TYPE:  AMB   LOCATION:  DAY                                  FACILITY:  [facility]   PHYSICIAN:  Marlowe Kays, M.D.               DATE OF BIRTH:  01-24-1927   DATE OF PROCEDURE:  05/09/2004  DATE OF DISCHARGE:                                 OPERATIVE REPORT   PREOPERATIVE DIAGNOSES:  Recurrent rotator cuff tear, left shoulder.   POSTOPERATIVE DIAGNOSES:  Recurrent rotator cuff tear, left shoulder.   OPERATION:  Repair of recurrent rotator cuff tear left shoulder and  application of 90 degree abduction brace.   SURGEON:  Marlowe Kays, M.D.   ASSISTANTDruscilla Brownie. Cherlynn June.   ANESTHESIA:  General.   PATHOLOGY AND JUSTIFICATION FOR PROCEDURE:  Original repair was in December  of last year with a recurrent tear repaired in April.  Initially she did  well. She is on my usual rotator cuff protocol but because of progressive  pain we performed an arthrogram on her and this demonstrated a complete  tear.  This was confirmed at surgery with a 3 cm tear in the mid portion of  the rotator cuff intact. Sutures were noted anteriorly and posteriorly.  There was about 1 1/2 to 2 cm of retraction of the cuff tear.  Even though  her rotator cuff tissue appeared to be nice and thick, it was clearly tissue  failure with the Mitek anchors still well attached to bone.  I had also  fitted her with the brace presurgically. We plan to further stabilize the  repair by keeping her in the 90 degree abduction brace for at least four  weeks.   DESCRIPTION OF PROCEDURE:  Prophylactic antibiotics, satisfactory general  anesthesia, beach chair position on the Schlein frame, the left shoulder  girdle was prepped with duraprep, draped in a sterile, Ioban employed. The  previous surgical incision was excised. I used cutting cautery to dissect  off the fascia over the  remaining anterior acromion.  I used a small rongeur  to undermine it and remove the remaining soft tissue and a partial  bursectomy was performed and bursa separated from the underlying rotator  cuff and the above mentioned tear configuration was noted, the tear was  flexible.  After determining the exact anatomy of the tear, I roughened up  the articular surface above the greater tuberosity and placed three evenly  spaced super Mitek anchors and then wove then through the rotator cuff and  then tied them all down simultaneously with the arm abducted 90 degrees and  resting on the Mayo stand.  I then supplemented the tail end of the rotator  cuff with interrupted #1 Ethibond sutures from the anchors.  The wound was  then irrigated with sterile saline, soft tissue was infiltrated with 0.5%  Marcaine with adrenaline. I reattached  the deltoid muscle to the remaining  acromion through drill holes with #1 Vicryl and the small incision in the  lateral deltoid with the same. The subcutaneous tissue was closed with 2-0  Vicryl, Steri-Strips on the skin, dry  sterile dressing applied. While she was asleep she was placed in the  previously constructed abduction brace.  She tolerated the procedure well  and was taken to the recovery room in satisfactory condition with no known  complications.                                               Marlowe Kays, M.D.    JA/MEDQ  D:  05/09/2004  T:  05/09/2004  Job:  [id number]

## 2011-03-15 NOTE — H&P (Signed)
[name], [name]               ACCOUNT NO.:  [id number]   MEDICAL RECORD NO.:  [id number]          PATIENT TYPE:  ORB   LOCATION:  4528                         FACILITY:  [facility]   PHYSICIAN:  Ellwood Dense, M.D.   DATE OF BIRTH:  11/11/1926   DATE OF ADMISSION:  12/13/2005  DATE OF DISCHARGE:                                HISTORY & PHYSICAL   PRIMARY CARE PHYSICIAN:  Dr. Idell Pickles   ORTHOPEDIST:  Dr. Simonne Come   HISTORY OF PRESENT ILLNESS:  [name] is a 77 year old adult female  admitted to [facility] December 10, 2005 with advanced  osteoarthritis of the left hip.  She had failed conservative treatment.   Patient underwent left total hip arthroplasty December 10, 2005 by Dr.  Simonne Come.  She was placed on Coumadin for DVT prophylaxis postoperatively  and allowed touchdown weightbearing on the left lower extremity.  She  developed postoperative anemia with hemoglobin of 8.6, was transfused 2  units of packed red blood cells with improvement in her hemoglobin to 10.6.  She denied nausea and vomiting or chest pain.   The patient was evaluated by the rehabilitation physicians and felt to be an  appropriate candidate for SACU level therapies.   REVIEW OF SYSTEMS:  Positive for reflux and insomnia.   PAST MEDICAL HISTORY:  1.  Hypertension.  2.  Bronchitis.  3.  Cholecystectomy.  4.  Hand surgery.  5.  Bilateral shoulder surgery.  6.  Dyslipidemia.  7.  Right knee arthroscopy.   FAMILY HISTORY:  Positive for coronary artery disease, cancer, and stroke.   SOCIAL HISTORY:  Patient is widowed and denies alcohol or tobacco.  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  She has a sister who  plans to come from out of state to assist as the weather allows.  The  patient lives in a one-level home with two steps to enter.   FUNCTIONAL HISTORY:  Prior to admission the patient was independent.   ALLERGIES:  CODEINE and PLASTIC TAPE.   MEDICATIONS  PRIOR TO ADMISSION:  1.  Diovan 160/12.5 mg daily.  2.  Norvasc 5 mg daily.  3.  Zocor 20 mg daily.  4.  Premarin 0.625 mg q.h.s.  5.  Centrum Silver daily.  6.  Aspirin 81 mg daily.  7.  Provera 5 mg days 16-25 monthly.   LABORATORIES:  INR 1.5.  Recent hemoglobin was 10.6, hematocrit 30.9,  platelet count 201,000, white count 10.1.  Recent sodium 133, potassium 3.3,  chloride 100, CO2 29, BUN 6, creatinine 0.8.   PHYSICAL EXAMINATION:  GENERAL:  Well-appearing, fit adult elderly female  lying in bed in no acute discomfort.  VITAL SIGNS:  Blood pressure 108/52 with a pulse of 86, respiratory rate 20,  and temperature 98.7.  HEENT:  Normocephalic, atraumatic.  CARDIOVASCULAR:  Regular rate and rhythm.  S1, S2 without murmurs.  ABDOMEN:  Soft, nontender with positive bowel sounds.  LUNGS:  Clear to auscultation bilaterally without rhonchi, wheezes, or  rales.  NEUROLOGIC:  Alert and oriented x3.  Cranial nerves II-XII are  intact.  Bilateral __________ examination showed 4+ to 5-/5 strength throughout.  Bulk and tone were normal.  Reflexes were 2+ and symmetrical.  Right lower  extremity strength was 5-/5 in hip flexion, knee extension, and ankle  dorsiflexion.  Left lower extremity strength was 4-/5.  She has a dry  dressing over her left hip where staples are in place.  There is minimal dry  drainage on the dressing involving the left hip.   IMPRESSION:  Status post left total hip arthroplasty secondary to  osteoarthritis, postoperative day #3.   Presently, the patient has deficits in activities of daily living,  transfers, and ambulation secondary to her left total hip replacement.   PLAN:  1.  Admit to the subacute unit for daily OT and PT therapies to advance      ADLs, transfers, and ambulation.  2.  24-hour nursing for medication administration, wound dressing changes,      and monitoring of vitals.  3.  Continue Coumadin per pharmacy with continuation of subcutaneous  Lovenox      until INR is greater than or equal than 2 consistently on Coumadin.  4.  p.r.n. Vicodin and Robaxin for pain.  5.  Continue Zocor for dyslipidemia.  6.  Monitor hypertension on hydrochlorothiazide, Avapro, and Norvasc.  7.  Recheck laboratories Monday morning, December 16, 2005 including CBC and      CMET.  8.  Add Trinsicon one tablet p.o. b.i.d. for postoperative anemia.  9.  Continue Provera 5 mg daily days 16-25 of the month.  10. Continue total hip precautions.  11. Elevate heels off bed using pillow.   PROGNOSIS:  Good.   ESTIMATED LENGTH OF STAY:  5-10 days.   GOALS:  Modified independence ADLs, transfers, and ambulation.           ______________________________  Ellwood Dense, M.D.     DC/MEDQ  D:  12/13/2005  T:  12/13/2005  Job:  [id number]

## 2011-03-15 NOTE — Op Note (Signed)
NAME:  [name], [name]                         ACCOUNT NO.:  [id number]   MEDICAL RECORD NO.:  [id number]                   PATIENT TYPE:  AMB   LOCATION:  DAY                                  [facility]ACILITY[facility]  [facility]   PHYSICIAN:  Marlowe Kays, M.D.               DATE OF BIRTH:  1927/01/28   DATE OF PROCEDURE:  10/26/2003  DATE OF DISCHARGE:                                 OPERATIVE REPORT   PREOPERATIVE DIAGNOSES:  1. Degenerative arthritis of the acromioclavicular joint.  2. Severe rotator cuff tear left shoulder.   POSTOPERATIVE DIAGNOSES:  1. Degenerative arthritis of the acromioclavicular joint.  2. Severe rotator cuff tear left shoulder.   OPERATION:  1. Anterior acromionectomy and repair of torn rotator cuff.  2. Open resection distal clavicle left shoulder.   SURGEON:  Marlowe Kays, M.D.   ASSISTANTDruscilla Brownie. Cherlynn June.   ANESTHESIA:  General.   PATHOLOGY AND JUSTIFICATION FOR PROCEDURE:  She has had a history of prior  rotator cuff repair on the right which has done well.  She has had  progressive pain in the left shoulder with an arthrogram demonstrating a  significant tear. She also has advanced degenerative arthritis of the [facility]  joint.  It is felt the above mentioned surgery was the treatment of choice.   DESCRIPTION OF PROCEDURE:  Prophylactic antibiotics, satisfactory general  anesthesia, beach chair position on the  Schlein frame, left shoulder girdle  was prepped and draped in a sterile field, Ioban employed, I made an  incision gently curved over the distal clavicle and then down vertically  over the rotator cuff. With subperiosteal dissection, the distal clavicle  and AC joint were exposed.  It was badly disrupted and arthritic.  I then  continued the dissection, it was subperiosteal over the anterior acromion  splitting the deltoid for a centimeter or two.  I placed a Cobb elevator  beneath the bleeding edge of the acromion, joint fluid came forth  confirming  the tear.  With the large Cobb elevator beneath the anterior acromion, I  made my initial anterior acromionectomy.  She had a severe impingement  problem and I kept removing bone from the underneath surface of the acromion  until she had a wide decompression. The tear was identified, it was a full  rotator cuff tear with retraction but the rotator cuff was flexible.  I then  went ahead and dissected out the distal clavicle with subperiosteal  dissection and placed two protective elevators beneath the distal clavicle  and resected the distal 1 1/2 cm. There were no bone spicules left. The raw  bone was covered with bone wax.  I went back to the rotator cuff and after  assessing the pathology used two super Mitek anchors with the four sutures  evenly split along the rotator cuff with wide base from the tip to obtain  good healthy tissue.  With the  arm abducted, I tied the four sutures and  then made additional tack down sutures around the perimeter with an  additional #1 Ethibond. This gave a nice stable repair with good  decompression. The wound was then well irrigated with sterile saline and  Gelfoam was packed in the clavicle resection site.  I injected all soft  tissues with 0.5% plain Marcaine and then closed the deltoid with  interrupted #1 Vicryl in two layers and also the fascia over the anterior  acromion and anterior acromionectomy resection site with the same. This gave  a nice stable repair.  The subcutaneous tissue was closed with a combination  of 2-0 Vicryl deep, 4-0 Vicryl superficial and Steri-Strips on the skin.  Dry sterile dressing, shoulder immobilizer applied. She tolerated the  procedure well and was taken to the recovery room in satisfactory condition  with no known complications.                                               Marlowe Kays, M.D.    JA/MEDQ  D:  10/26/2003  T:  10/26/2003  Job:  [id number]

## 2011-03-15 NOTE — H&P (Signed)
NAMERAYME, BUI               ACCOUNT NO.:  [id number]   MEDICAL RECORD NO.:  [id number]          PATIENT TYPE:  INP   LOCATION:  NA                           FACILITY:  [facility]   PHYSICIAN:  [name], M.D.  DATE OF BIRTH:  September 05, 1927   DATE OF ADMISSION:  DATE OF DISCHARGE:                                HISTORY & PHYSICAL   DATE OF ADMISSION:  December 10, 2005   CHIEF COMPLAINT:  Pain in my left hip.   PRESENT ILLNESS:  This 77 year old white female who has successfully  undergone bilateral shoulder surgery by [place] with the last one being on the  right some 3 months ago. She is doing well with that but is now having  increasing pain and discomfort into both of her hips, more so on the left  than right. She is a very active lady who is employed as an Recruitment consultant and is having more and more difficulty getting about and has  developed a progressive, painful lurch and limp to the left. X-rays are  showing bone-on-bone deformity to the left hip. After much discussion  including the risks and benefits of surgery, it was decided she would  benefit from surgical intervention and is being admitted for total hip  replacement arthroplasty to the left hip.   PAST MEDICAL HISTORY:  The patient has hypertension, had some bronchitis  about 2 years ago. She has some slight intolerance to CODEINE which keeps  her awake, no true allergy. Her current medications are Diovan, Norvasc,  Zocor, Premarin, and Centrum Silver. She also takes an aspirin a day which  she will stop prior to surgery. Surgeries include a cholecystectomy, hand  surgery, and bilateral shoulder surgeries.   FAMILY HISTORY:  Positive for heart disease in her father and two brothers,  and cancer in her grandfather and mother, and stroke in her brother.   SOCIAL HISTORY:  The patient is widowed, she is an Environmental health practitioner,  has no intake or alcohol or tobacco products, has one child.   REVIEW OF  SYSTEMS:  CNS:  No seizure disorder, paralysis, numbness, or  double vision. RESPIRATORY:  No productive cough, no hemoptysis or shortness  of breath. CARDIOVASCULAR:  No chest pain, no angina or orthopnea.  GASTROINTESTINAL:  No nausea, vomiting, melena, or bloody stools.  GENITOURINARY:  No discharge, dysuria, or hematuria. MUSCULOSKELETAL:  Primarily in present illness.   PHYSICAL EXAMINATION:  GENERAL:  Alert and cooperative, friendly, totally  alert and oriented 77 year old white female.  VITAL SIGNS:  Blood pressure 140/68, pulse 80, respirations 12.  HEENT:  Normocephalic, PERRLA, EOM intact, oropharynx is clear.  CHEST:  Clear to auscultation. No rhonchi, no rales.  HEART:  Regular rate and rhythm, no murmurs are heard.  ABDOMEN:  Soft, nontender. Liver and spleen not felt.  GENITALIA, RECTAL, PELVIC, BREAST:  Not done, not pertinent to present  illness.  EXTREMITIES:  The patient has painful range of motion of both hips but more  so on the left with crepitus with flexion contracture.   ADMITTING DIAGNOSES:  1.  Severe osteoarthritis of  the left hip.  2.  Hypertension.   PLAN:  The patient will undergo a left total hip replacement arthroplasty  and plan to have home health after her regular hospitalization. Her  admission to [facility] will be December 10, 2005.      [name].    ______________________________  [name], M.D.    DLU/MEDQ  D:  12/06/2005  T:  12/06/2005  Job:  [id number]   cc:   Dellis Anes. Idell Pickles, M.D.  Fax: [phone]

## 2011-03-15 NOTE — Op Note (Signed)
[name], [name]               ACCOUNT NO.:  [id number]   MEDICAL RECORD NO.:  [id number]          PATIENT TYPE:  AMB   LOCATION:  DAY                          FACILITY:  [facility]   PHYSICIAN:  Marlowe Kays, M.D.  DATE OF BIRTH:  05-Nov-1926   DATE OF PROCEDURE:  09/06/2005  DATE OF DISCHARGE:                                 OPERATIVE REPORT   PREOPERATIVE DIAGNOSIS:  Osteoarthritis, left hip.   POSTOPERATIVE DIAGNOSIS:  Osteoarthritis, left hip.   OPERATION:  Injection left hip joint under fluoro in operating room with  Xylocaine for diagnosis and steroid for treatment.   SURGEON:  Marlowe Kays, M.D.   ASSISTANT:  Nurse.   ANESTHESIA:  Local.   PATHOLOGY AND JUSTIFICATION FOR PROCEDURE:  She is having severe pain in her  left hip joint. She has pain on motion of the hip. X-rays show  osteoarthritis of the left hip but her right hip clearly looks worse than  her left on radiograph.  Her back does not seem to be the cause for her  pain. She is here today to try and determine if local injection of the hip  joint eases her pain and if so inject her with a steroid to try and obtain  pain relief. She has recently had a rotator cuff repair and would be unable  to go through a postop course for a total hip replacement at this time.   PROCEDURE:  Her left inguinal area was prepped with Betadine and draped in a  sterile field. Using fluoro, I localized the hip joint and then began  infiltrating it with 1% Xylocaine gradually working a 20 gauge needle down  into the hip joint itself. I then injected this area with an additional 5 mL  of 1% Xylocaine and she had almost complete relief of pain on rotation and  flexion of the hip. This was extremely painful pre-injection. I then  followed this with 80 mg of Depo-Medrol without complication. I then checked  her hip motion once again with the needle removed and she almost had  complete pain relief with full motion. At the time of this  dictation, she  was taken to the outpatient department without complication.           ______________________________  Marlowe Kays, M.D.     JA/MEDQ  D:  09/06/2005  T:  09/06/2005  Job:  [id number]

## 2011-03-15 NOTE — Discharge Summary (Signed)
[name], [name]               ACCOUNT NO.:  [id number]   MEDICAL RECORD NO.:  [id number]          PATIENT TYPE:  ORB   LOCATION:  4528                         FACILITY:  [facility]   PHYSICIAN:  Mariam Dollar, P.A.  DATE OF BIRTH:  05-03-1927   DATE OF ADMISSION:  DATE OF DISCHARGE:  12/19/2005                                 DISCHARGE SUMMARY   DISCHARGE DIAGNOSES:  1.  Left total hip replacement secondary to osteoarthritis December 10, 2005. Pain management: Coumadin for deep venous thrombosis prophylaxis.  2.  Postoperative anemia.  3.  Hyperlipidemia.  4.  Hypertension.  5.  History of bilateral shoulder surgery.   HISTORY OF PRESENT ILLNESS:  This is a 77 year old white female admitted  [facility] on February 13 with advanced osteoarthritis of the left  hip and no relief with conservative care. She underwent a left total hip  replacement February 13 per Dr. Simonne Come. She was placed on Coumadin for  deep venous thrombosis prophylaxis touchdown weight bearing. Postoperative  anemia was 8.6. She was transfused two units of packed red blood cells.  Hemoglobin improved at 10.6. There were no chest pain, no nausea, no  vomiting. Patient was admitted to subacute care services.   PAST MEDICAL HISTORY:  Please see Discharge Diagnoses.   SOCIAL HISTORY:  No alcohol or tobacco.   ALLERGIES:  Codeine, plastic tape.   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 Sister is to come in from out of state to assist for a short time.  She lives in a one level home with two steps to entry.   MEDICATIONS PRIOR TO ADMISSION:  1.  Diovan 160--12.5 daily.  2.  Norvasc 5 mg daily.  3.  Zocor 20 mg daily.  4.  Premarin 0.625 mg at bedtime.  5.  Multivitamin.  6.  Provera 5 mg, sixteenth through the twenty-fifth of each month.  7.  Aspirin 81 mg daily.   HOSPITAL COURSE:  On rehabilitation hospital course, patient was admitted to  inpatient subacute care services for intensive rehabilitation therapies on a  daily basis consisting of physical therapy, occupational therapy and  rehabilitation nursing. The following issues were addressed during patient's  rehabilitation stay: Pertaining to Mrs. Imburgia's left total hip replacement  of February 13, surgical site healing nicely. No signs of infection. Staples  intact. She was total hip precaution with touchdown weight bearing. She  would follow up with Dr. Simonne Come. She remained on Vicodin and Robaxin as  needed for pain management with good results. Coumadin for deep venous  thrombosis prophylaxis with latest INR of 2.8. She had been on subcutaneous  Lovenox until INR greater than 2. Postoperative anemia was stable with  latest hemoglobin of 10 and hematocrit of 29.3 as she remained on iron  supplement. Blood pressures controlled with Avapro, Norvasc and  hydrochlorothiazide with diastolic pressures of 58 to 65. She would remain  on her Zocor for hyperlipidemia. She had no bowel or bladder disturbances.  Functionally, she was ambulating extended distances with a walker and  essentially independent to standby assist in all areas of activities of  daily living. Home health therapies have been arranged. During her  rehabilitation course, she did have some mild hypokalemia of 3.0 with  supplements added. She was discharged to home in stable condition.   DISCHARGE MEDICATIONS AT DICTATION:  1.  Coumadin 5 mg daily until January 07, 2006 and stop.  2.  Diovan 160/12.5 mg daily.  3.  Norvasc 5 mg daily.  4.  Premarin 0.625 mg daily.  5.  Provera 5 mg daily sixteenth through twenty-fifth of each month.  6.  Multivitamin daily.  7.  Zocor 20 mg daily.  8.  Trinsicon 1 capsule twice daily.  9.  Vicodin 1 or 2 tablets every 4 hours as needed pain.  10. Robaxin 500 mg every 6 hours as needed spasms.   ACTIVITY:  Touchdown, weight bearing with hip precautions.   DIET:   Regular.   WOUND CARE:  Cleanse incision daily with warm soap and water.   FOLLOW UP:  Call Dr. Simonne Come for removal of staples. Home health nurse per  [facility] to complete Coumadin protocol. Patient was  advised to resume her aspirin therapy after Coumadin completed.      Mariam Dollar, P.A.     DA/MEDQ  D:  12/18/2005  T:  12/18/2005  Job:  [id number]   cc:   Ellwood Dense, M.D.  Fax: [phone]   Dellis Anes. Idell Pickles, M.D.  Fax: [phone]   Marlowe Kays, M.D.  Fax: [phone]

## 2011-03-15 NOTE — Op Note (Signed)
NAME:  [name], [name]                         ACCOUNT NO.:  [id number]   MEDICAL RECORD NO.:  [id number]                   PATIENT TYPE:  AMB   LOCATION:  DSC                                  FACILITY:  [facility]   PHYSICIAN:  Marlowe Kays, M.D.               DATE OF BIRTH:  04/12/1927   DATE OF PROCEDURE:  02/01/2004  DATE OF DISCHARGE:                                 OPERATIVE REPORT   PREOPERATIVE DIAGNOSIS:  Recurrent rotator cuff tear, left shoulder.   POSTOPERATIVE DIAGNOSIS:  Recurrent rotator cuff tear, left shoulder.   OPERATION:  1. Repair of recurrent rotator cuff tear, left shoulder.  2. Distal clavicle resection.   SURGEON:  Marlowe Kays, M.D.   ASSISTANT:  Clarene Reamer, P.A.-C.   ANESTHESIA:  General.   INDICATIONS FOR PROCEDURE:  Original repair was on 10/26/03, roughly three  months ago.  She had an extensive tear.  Because of persistent pain several  months after surgery, I performed arthrogram, which demonstrated  extravasation of dye through the rotator cuff, consistent with a recurrent  tear.  At surgery, the tear was as depicted on the arthrogram in the  anterior portion of the previous repair with the recurrence appearing to be  due to tissue failure.  Her rotator cuff tissue, although thick, was not of  strong consistency and appeared that some of the previous super-Mitek anchor  sutures had simply pulled through the tissue.   PROCEDURE:  Prophylactic antibiotics, satisfactory general anesthesia, beach  chair position on the Schlein frame, left shoulder girdle was prepped with  DuraPrep, draped in the sterile field Ioban employed, went through the old  surgical incision, cutting cautery.  I opened the fascia on the residual  anterior acromion in line with the skin incision and joint fluid immediately  came forth, was able to visualize the anatomy of the recurrent tear and  removed prior nonabsorbable sutures that were lying free in the  subacromial  space.  I then placed a single #1 Ethibond sutures posteriorly and  anteriorly to help stabilize the flap, which had been created and then used  two super-Mitek anchors to bring the flap back to its position on the  greater tuberosity after roughening up the underlying bone with small  rongeur.  I then supplemented the flap with several #1 Vicryl sutures.  The  lateral soft tissue, however, was fairly flimsy.  I did think that I  obtained a good solid repair.  I took deep bites on the rotator cuff to try  and prevent a recurrence.  There was no residual impingement at closure.  The wound was irrigated with sterile saline, soft tissue was infiltrated  with 0.5% Marcaine with adrenalin.  I reapproximated the fascia over the  anterior acromion and the deltoid muscle with interrupted #1 Vicryl,  subcutaneous tissue with interrupted 2-0 Vicryl and the skin with Steri-  Strips.  Dry sterile dressing, shoulder  immobilizer were applied.  She  tolerated the procedure well and was taken to recovery in satisfactory  condition.  There were no complications.                                               Marlowe Kays, M.D.   JA/MEDQ  D:  02/01/2004  T:  02/02/2004  Job:  [id number]

## 2011-03-15 NOTE — Op Note (Signed)
[name], [name]               ACCOUNT NO.:  [id number]   MEDICAL RECORD NO.:  [id number]          PATIENT TYPE:  AMB   LOCATION:  DAY                          FACILITY:  [facility]   PHYSICIAN:  Marlowe Kays, M.D.  DATE OF BIRTH:  13-Jul-1927   DATE OF PROCEDURE:  08/06/2005  DATE OF DISCHARGE:                                 OPERATIVE REPORT   PREOPERATIVE DIAGNOSIS:  Recurrent rotator cuff tear, right shoulder.   POSTOPERATIVE DIAGNOSIS:  Recurrent rotator cuff tear, right shoulder.   OPERATION:  Repair of recurrent rotator cuff tear, right shoulder.   SURGEON:  Marlowe Kays, M.D.   ASSISTANT:  Mr. Idolina Primer, P.A.-C.   ANESTHESIA:  General.   PATHOLOGY AND JUSTIFICATION FOR PROCEDURE:  I repaired a prior recurrence in  February of this year. At that time, the head of her biceps tendon was  intact. She developed recurrent pain leading to an arthrogram in May of this  year which demonstrated a complete tear. Clinically she had also rupture the  long head of her biceps tendon. She is here at this time for a repeat  repair. She understands that we may need to use an abduction pillow although  she was dead set against it and also possibly a graft patch. As noted below  neither of theses factors were required.   DESCRIPTION OF PROCEDURE:  Prophylactic antibiotic, satisfactory general  anesthesia, beach-chair position on the Schlein frame. The right shoulder  was prepped with DuraPrep, draped in a sterile field. Ioban employed. I went  through the old vertical incision. With cautery dissection, I dissected the  deltoid fascia off the residual anterior acromion. I also used scissor  dissection for precaution of the underlying rotator cuff. I then assessed  the pathology. She had anterior and posterior flaps of rotator cuff which  was actually quite thick. She also had a large tongue of tissue in the  middle which was biceps tendon which had ruptured and was creating both  mechanical impediment to shoulder motion and also inhibiting my repair so I  excised this tongue of tissue. The prior repair was partly intact. It looked  like the failure was mainly sutures pulling out of the rotator cuff. After  scraping the cartilage and bone superior to the greater tuberosity,  posteriorly I used two super Mitek anchors to bring the very thick flap of  rotator cuff back into position suturing it with these anchors and also then  tacking down this flap with multiple interrupted #1 Ethibond suture to very  thick tissue over the greater tuberosity so we had a good stable posterior  half to the rotator cuff. The anterior flap, I was then able to bring  lateral ward and posterior ward and suture. We basically and upside-down T'd  tear and the vertical portion of the T, I was then able to close with  multiple interrupted #1 Vicryl and anteriorly the anterior wing of the T, I  was able to suture to the greater tuberosity with the same. This gave a very  nice stable repair with her arm to her side and  I did not feel that any  graft patch was necessary. The wound was irrigated sterile saline and soft  tissues infiltrated with 1/2% Marcaine with adrenaline. I reapproximated the  deltoid muscle with interrupted #1 Vicryl and reattached it to the residual  anterior acromion through holes in the bone made  with a towel clip and using #1 Vicryl as well. This gave a nice stable  repair. The subcutaneous  tissue was closed with 2-0 Vicryl and the skin  with Steri-Strips. Dry sterile dressing and shoulder mobilizer applied. She  tolerated the procedure well and was taken to the recovery room in  satisfactory condition with no known complications.           ______________________________  Marlowe Kays, M.D.     JA/MEDQ  D:  08/06/2005  T:  08/06/2005  Job:  [id number]

## 2011-03-15 NOTE — Discharge Summary (Signed)
[name], [name]               ACCOUNT NO.:  [id number]   MEDICAL RECORD NO.:  [id number]          PATIENT TYPE:  INP   LOCATION:  1613                         [facility]ACILITY:  [facility]   PHYSICIAN:  Marlowe Kays, M.D.  DATE OF BIRTH:  [birth year]   DATE OF ADMISSION:  12/18/2006  DATE OF DISCHARGE:  12/24/2006                               DISCHARGE SUMMARY   ADMITTING DIAGNOSES:  1. End-stage osteoarthritis of right hip.  2. Hypertension tension  3. Hypercholesterolemia.   DISCHARGE DIAGNOSES:  1. End-stage osteoarthritis of right hip.  2. Hypertension tension  3. Hypercholesterolemia.  4. Postoperative hyponatremia.  5. Postoperative acute hemorrhagic anemia, treated with transfusion.   OPERATION:  On December 18, 2006,  the patient underwent [employer] total  hip replacement arthroplasty, Press-Fit with Dr. Worthy Rancher assisting  Dr. Sedonia Small.   BRIEF HISTORY:  This 77 year old white female had successfully undergone  left total hip replacement arthroplasty last year, now is having problem  with her right hip.  She is a very active lady who is employed and  having more and more difficulty getting about.  X-rays have shown  deterioration of the joint.  After much consideration and the fact that  conservative care with analgesics, anti-inflammatories have not helped  her and the fact that the joint was essentially obliterated, it was felt  she would benefit from surgical intervention. After risks and benefits  of surgery described to her, she was admitted for the above procedure.   COURSE IN THE HOSPITAL:  She tolerated the surgical procedure quite  well.  It was noted postoperatively hemoglobin dropped to 8.0, and we  decided to transfuse her for her postoperative anemia.  This brought her  hemoglobin up to 9.3, but the next day she dropped again to 7.5.  We  transfused her this time with 3 units of packed cells, bringing her  hemoglobin up to 9.9, and the day prior to  discharge it was 9.8.  She  had no active bleeding into the wound.  The wound actually stayed clean  and dry.  Neurovascular was intact to the right lower extremity.   The patient entered into the total hip protocol.  We did a Press-Fit of  the femoral stem and acetabulum. She was allowed touchdown weightbearing  only which she was able to accomplish with little to no difficulty.  She  was certainly used to the total hip protocol because of her left hip.  She had all her equipment at home, and on the day of discharge she was  quite anxious to go home.  It was thought that she might go to skilled  nursing; however, she will have home health with [place].   Reason for her extended hospitalization is for the postoperative anemia  primarily which had to be treated prior to any kind of return home.  Postoperative x-rays showed excellent position and alignment of the  prosthesis.   LABORATORY VALUES IN THE HOSPITAL:  Hematologically showed a  preoperative CBC completely within normal limits.  Hematocrit was  slightly down 34.9.  Hemoglobin was 12.1.  As mentioned above,  hemoglobin in the hospital dropped as low as 7.5. After transfusion, it  was 9.9 with hemoglobin of 9.8 on December 23, 2006.  Blood chemistries  showed a postoperative drop in her sodium, but this was resolved by  offering juices.  Urinalysis essentially negative for urinary tract  infection.   Chest x-ray from 26 August 2006 showed no evidence for active chest  disease.   Electrocardiogram showed sinus rhythm with premature atrial complex   CONDITION ON DISCHARGE:  Improved, stable.   PLAN:  Patient discharged to her home.  She is to continue with Coumadin  protocol for 4 weeks after date of surgery.  She is to continue  touchdown weightbearing to the right lower extremity.  She is encouraged  to call should she have any problems or questions.  She is given  Darvocet for discomfort, Robaxin muscle relaxant and iron  supplement  daily.  Return to see Dr. Sedonia Small  2 weeks after date of surgery.  Follow with her medical doctor as indicated.      Dooley L. Cherlynn June.    ______________________________  Marlowe Kays, M.D.    DLU/MEDQ  D:  12/24/2006  T:  12/24/2006  Job:  [id number]   cc:   C. Duane Lope, M.D.  Fax: [phone]

## 2011-03-15 NOTE — Discharge Summary (Signed)
[name], [name]               ACCOUNT NO.:  [id number]   MEDICAL RECORD NO.:  [id number]          PATIENT TYPE:  INP   LOCATION:  1509                         FACILITY:  [facility]   PHYSICIAN:  Marlowe Kays, M.D.  DATE OF BIRTH:  1927/06/27   DATE OF ADMISSION:  12/10/2005  DATE OF DISCHARGE:  12/13/2005                                 DISCHARGE SUMMARY   ADMISSION DIAGNOSES:  1.  Severe osteoarthritis of the left hip.  2.  Hypertension.   DISCHARGE DIAGNOSES:  1.  Severe osteoarthritis of the left hip.  2.  Hypertension.  3.  Postoperative anemia treated with transfusion.   OPERATION:  On December 10, 2005, the patient underwent [employer] total hip  replacement arthroplasty of the left hip, Ronald A. Gioffre assisted.   BRIEF HISTORY:  This 77 year old lady who is a very active person who has  had increasing pain and discomfort in both of her hips. She has more hip  pain in the left hip than the right. X-rays showed bone on bone deformity  with irregularity of the femoral head. 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 She has a painful lurch and limp  to the left. After the risks and benefits of surgery were described to the  patient and she was cleared preoperatively by Dr. Idell Pickles it was decided to  go ahead with the above procedure.   HOSPITAL COURSE:  The patient tolerated the surgical procedure quite well.  It was thought early on that she would benefit with an inpatient  rehabilitation program and we therefore asked for and received a consult for  [facility]. We continued with physical therapy in the hospital  per total hip protocol with hip precautions. She was also placed on Coumadin  protocol postoperatively for the prevention of DVT. It was noted that her  hemoglobin dropped to 8.6 postoperatively with a hematocrit of 26.1. After  her  permission and discussion of how she would benefit with transfusion, she  was transfused with 2 units of packed cells bringing her hemoglobin up to  10.9 with a hematocrit of 31.5. Final hemoglobin was 10.6, hematocrit was  30.9. She was on iron supplements.   Her wound remained clean and dry with no evidence of infection. Calf was  soft. She was ready for transfer to [facility] to continue with her  total hip protocol.   Laboratory values in the hospital hematologically preoperatively showed a  CBC completely within normal limits. Hemoglobin was 12.4, hematocrit was  36.7. As previously mentioned, final hemoglobin was 10.6, hematocrit was  30.9. Last INR was 1.4. Blood chemistries remained essentially normal,  nonfasting glucose was 123 and 112 respectively. She did have a very minimal  drop in her sodium at 133, potassium was 3.3. Her urinalysis showed a mild  urinary tract infection. She was placed on perioperative antibiotics  intravenously. No chest x-ray nor electrocardiogram seen on this chart.   CONDITION ON DISCHARGE TO SUBACUTE CARE  UNIT:  Improved and stable.   PLAN:  We will continue to follow the patient after her transfer to [facility].      Rock L. Cherlynn June.    ______________________________  Marlowe Kays, M.D.    DLU/MEDQ  D:  12/25/2005  T:  12/26/2005  Job:  [id number]   cc:   Dellis Anes. Idell Pickles, M.D.  Fax: [phone]

## 2011-07-26 LAB — BASIC METABOLIC PANEL
BUN: 23
CO2: 25
Calcium: 9.3
GFR calc non Af Amer: 51 — ABNORMAL LOW
Glucose, Bld: 111 — ABNORMAL HIGH

## 2011-07-26 LAB — HEMOGLOBIN AND HEMATOCRIT, BLOOD: Hemoglobin: 8.6 — ABNORMAL LOW

## 2011-07-26 LAB — TYPE AND SCREEN
ABO/RH(D): O POS
Antibody Screen: NEGATIVE

## 2011-08-09 LAB — POCT I-STAT 4, (NA,K, GLUC, HGB,HCT)
Hemoglobin: 13.9
Potassium: 4.2

## 2012-02-04 ENCOUNTER — Other Ambulatory Visit: Payer: Self-pay | Admitting: Orthopedic Surgery

## 2012-02-04 DIAGNOSIS — M545 Low back pain: Secondary | ICD-10-CM

## 2012-02-06 ENCOUNTER — Ambulatory Visit
Admission: RE | Admit: 2012-02-06 | Discharge: 2012-02-06 | Disposition: A | Discharge status: Home / Self Care | Payer: Medicare Other | Source: Ambulatory Visit | Attending: Orthopedic Surgery | Admitting: Orthopedic Surgery

## 2012-02-06 DIAGNOSIS — M545 Low back pain: Secondary | ICD-10-CM

## 2012-02-06 MED ORDER — GADOBENATE DIMEGLUMINE 529 MG/ML IV SOLN
15.0000 mL | Freq: Once | INTRAVENOUS | Status: AC | PRN
  Administered 2012-02-06: 15 mL via INTRAVENOUS

## 2014-03-19 ENCOUNTER — Emergency Department (HOSPITAL_COMMUNITY)
Admission: EM | Admit: 2014-03-19 | Discharge: 2014-03-19 | Disposition: A | Discharge status: Home / Self Care | Payer: Medicare Other | Attending: Emergency Medicine | Admitting: Emergency Medicine

## 2014-03-19 ENCOUNTER — Encounter (HOSPITAL_COMMUNITY): Payer: Self-pay | Admitting: Emergency Medicine

## 2014-03-19 ENCOUNTER — Emergency Department (HOSPITAL_COMMUNITY): Payer: Medicare Other

## 2014-03-19 DIAGNOSIS — Z79899 Other long term (current) drug therapy: Secondary | ICD-10-CM | POA: Insufficient documentation

## 2014-03-19 DIAGNOSIS — Y9289 Other specified places as the place of occurrence of the external cause: Secondary | ICD-10-CM | POA: Insufficient documentation

## 2014-03-19 DIAGNOSIS — Y9389 Activity, other specified: Secondary | ICD-10-CM | POA: Insufficient documentation

## 2014-03-19 DIAGNOSIS — IMO0002 Reserved for concepts with insufficient information to code with codable children: Secondary | ICD-10-CM | POA: Insufficient documentation

## 2014-03-19 DIAGNOSIS — E785 Hyperlipidemia, unspecified: Secondary | ICD-10-CM | POA: Insufficient documentation

## 2014-03-19 DIAGNOSIS — X500XXA Overexertion from strenuous movement or load, initial encounter: Secondary | ICD-10-CM | POA: Insufficient documentation

## 2014-03-19 DIAGNOSIS — I10 Essential (primary) hypertension: Secondary | ICD-10-CM | POA: Insufficient documentation

## 2014-03-19 DIAGNOSIS — S76219A Strain of adductor muscle, fascia and tendon of unspecified thigh, initial encounter: Secondary | ICD-10-CM

## 2014-03-19 HISTORY — DX: Essential (primary) hypertension: I10

## 2014-03-19 HISTORY — DX: Hyperlipidemia, unspecified: E78.5

## 2014-03-19 MED ORDER — HYDROCODONE-ACETAMINOPHEN 5-325 MG PO TABS: 1.0000 | ORAL_TABLET | Freq: Four times a day (QID) | ORAL | Status: DC | PRN

## 2014-03-19 MED ORDER — HYDROCODONE-ACETAMINOPHEN 5-325 MG PO TABS
1.0000 | ORAL_TABLET | Freq: Once | ORAL | Status: AC
  Administered 2014-03-19: 1 via ORAL
  Filled 2014-03-19: qty 1

## 2014-03-19 NOTE — ED Notes (Signed)
Pt reports that about 45 mins ago she twisted her left leg and has had groin pain since. Pt denies falling, vaginal bleeding, or vaginal discharge. Pt is A/O x4 and in NAD. Pt ambulatory with cane, which she had during her last hip surgery in 2006. Pt normally ambulates independently without support device. Pt lives at home with her son.

## 2014-03-19 NOTE — ED Provider Notes (Signed)
CSN: [id number]     Arrival date & time 03/19/14  1910 History   First MD Initiated Contact with Patient 03/19/14 1919     Chief Complaint  Patient presents with  . Groin Pain     (Consider location/radiation/quality/duration/timing/severity/associated sxs/prior Treatment) Patient is a 80 y.o. female presenting with groin pain. The history is provided by the patient (the pt twisted her left leg and has pain in the left groin).  Groin Pain This is a new problem. The current episode started 3 to 5 hours ago. The problem occurs constantly. The problem has not changed since onset.Pertinent negatives include no chest pain, no abdominal pain and no headaches. Exacerbated by: movement. Nothing relieves the symptoms. She has tried nothing for the symptoms.    Past Medical History  Diagnosis Date  . Hypertension   . Hyperlipemia    Past Surgical History  Procedure Laterality Date  . Hip surgery    . Back surgery    . Shoulder surgery     No family history on file. History  Substance Use Topics  . Smoking status: Never Smoker   . Smokeless tobacco: Never Used  . Alcohol Use: No   OB History   Grav Para Term Preterm Abortions TAB SAB Ect Mult Living                 Review of Systems  Constitutional: Negative for appetite change and fatigue.  HENT: Negative for congestion, ear discharge and sinus pressure.   Eyes: Negative for discharge.  Respiratory: Negative for cough.   Cardiovascular: Negative for chest pain.  Gastrointestinal: Negative for abdominal pain and diarrhea.  Genitourinary: Negative for frequency and hematuria.  Musculoskeletal: Negative for back pain.       Pan left groin  Skin: Negative for rash.  Neurological: Negative for seizures and headaches.  Psychiatric/Behavioral: Negative for hallucinations.      Allergies  Codeine  Home Medications   Prior to Admission medications   Medication Sig Start Date End Date Taking? Authorizing Provider  amLODipine  (NORVASC) 5 MG tablet Take 5 mg by mouth daily.   Yes Historical Provider, MD  methocarbamol (ROBAXIN) 500 MG tablet Take 500 mg by mouth 4 (four) times daily.   Yes Historical Provider, MD  simvastatin (ZOCOR) 20 MG tablet Take 20 mg by mouth daily.   Yes Historical Provider, MD  valsartan-hydrochlorothiazide (DIOVAN-HCT) 160-12.5 MG per tablet Take 1 tablet by mouth daily.   Yes Historical Provider, MD  HYDROcodone-acetaminophen (NORCO/VICODIN) 5-325 MG per tablet Take 1 tablet by mouth every 6 (six) hours as needed for moderate pain. 03/19/14   Benny LennertJoseph L Iasia Forcier, MD   BP 157/73  Pulse 84  Temp(Src) 98.5 F (36.9 C) (Oral)  Resp 18  SpO2 96% Physical Exam  Constitutional: She is oriented to person, place, and time. She appears well-developed.  HENT:  Head: Normocephalic.  Eyes: Conjunctivae and EOM are normal. No scleral icterus.  Neck: Neck supple. No thyromegaly present.  Cardiovascular: Normal rate and regular rhythm.  Exam reveals no gallop and no friction rub.   No murmur heard. Pulmonary/Chest: No stridor. She has no wheezes. She has no rales. She exhibits no tenderness.  Abdominal: She exhibits no distension. There is no tenderness. There is no rebound.  Musculoskeletal: Normal range of motion. She exhibits no edema.  Moderate tender left groin  Lymphadenopathy:    She has no cervical adenopathy.  Neurological: She is oriented to person, place, and time. She exhibits normal muscle  tone. Coordination normal.  Skin: No rash noted. No erythema.  Psychiatric: She has a normal mood and affect. Her behavior is normal.    ED Course  Procedures (including critical care time) Labs Review Labs Reviewed - No data to display  Imaging Review Dg Hip Complete Left  03/19/2014   CLINICAL DATA:  Groin pain  EXAM: LEFT HIP - COMPLETE 2+ VIEW  COMPARISON:  12/10/2005  FINDINGS: Bilateral hip replacements. No fracture or dislocation. No hardware loosening.  IMPRESSION: No acute abnormality.    Electronically Signed   By: Marlan Palau M.D.   On: 03/19/2014 19:58     EKG Interpretation None      MDM   Final diagnoses:  Groin strain        Benny Lennert, MD 03/19/14 2020

## 2014-03-19 NOTE — Discharge Instructions (Signed)
Follow up with your ortho md this week if not improving.

## 2016-09-10 NOTE — H&P (Signed)
  Victoria NajjarRachel C Mullins is an 82 y.o. female.    Chief Complaint: right shoulder pain  HPI: Pt is a 82 y.o. female complaining of right shoulder pain for multiple years. Pain had continually increased since the beginning. X-rays in the clinic show end-stage arthritic changes of the right shoulder. Pt has tried various conservative treatments which have failed to alleviate their symptoms, including injections and therapy. Various options are discussed with the patient. Risks, benefits and expectations were discussed with the patient. Patient understand the risks, benefits and expectations and wishes to proceed with surgery.   PCP:  No primary care provider on file.  D/C Plans: Home  PMH: Past Medical History:  Diagnosis Date  . Hyperlipemia   . Hypertension     PSH: Past Surgical History:  Procedure Laterality Date  . BACK SURGERY    . HIP SURGERY    . SHOULDER SURGERY      Social History:  reports that she has never smoked. She has never used smokeless tobacco. She reports that she does not drink alcohol or use drugs.  Allergies:  Allergies  Allergen Reactions  . Codeine Other (See Comments)    Insomnia     Medications: No current facility-administered medications for this encounter.    Current Outpatient Prescriptions  Medication Sig Dispense Refill  . amLODipine (NORVASC) 5 MG tablet Take 5 mg by mouth daily.    Marland Kitchen. HYDROcodone-acetaminophen (NORCO/VICODIN) 5-325 MG per tablet Take 1 tablet by mouth every 6 (six) hours as needed for moderate pain. 30 tablet 0  . methocarbamol (ROBAXIN) 500 MG tablet Take 500 mg by mouth 4 (four) times daily.    . simvastatin (ZOCOR) 20 MG tablet Take 20 mg by mouth daily.    . valsartan-hydrochlorothiazide (DIOVAN-HCT) 160-12.5 MG per tablet Take 1 tablet by mouth daily.      No results found for this or any previous visit (from the past 48 hour(s)). No results found.  ROS: Pain with rom of the right upper extremity  Physical  Exam:  Alert and oriented 82 y.o. female in no acute distress Cranial nerves 2-12 intact Cervical spine: full rom with no tenderness, nv intact distally Chest: active breath sounds bilaterally, no wheeze rhonchi or rales Heart: regular rate and rhythm, no murmur Abd: non tender non distended with active bowel sounds Hip is stable with rom  Right shoulder with limited rom and strength nv intact distally No rashes or edema  Assessment/Plan Assessment: right shoulder cuff arthropathy  Plan: Patient will undergo a right reverse total shoulder by Dr. Ranell PatrickNorris [facility]. Risks benefits and expectations were discussed with the patient. Patient understand risks, benefits and expectations and wishes to proceed.

## 2016-09-24 ENCOUNTER — Encounter (HOSPITAL_COMMUNITY): Payer: Self-pay

## 2016-09-24 ENCOUNTER — Encounter (HOSPITAL_COMMUNITY)
Admission: RE | Admit: 2016-09-24 | Discharge: 2016-09-24 | Disposition: A | Discharge status: Home / Self Care | Payer: Medicare Other | Source: Ambulatory Visit | Attending: Orthopedic Surgery | Admitting: Orthopedic Surgery

## 2016-09-24 DIAGNOSIS — Z01810 Encounter for preprocedural cardiovascular examination: Secondary | ICD-10-CM

## 2016-09-24 DIAGNOSIS — M12811 Other specific arthropathies, not elsewhere classified, right shoulder: Secondary | ICD-10-CM

## 2016-09-24 DIAGNOSIS — Z01812 Encounter for preprocedural laboratory examination: Secondary | ICD-10-CM | POA: Insufficient documentation

## 2016-09-24 HISTORY — DX: Unspecified osteoarthritis, unspecified site: M19.90

## 2016-09-24 HISTORY — DX: Gastro-esophageal reflux disease without esophagitis: K21.9

## 2016-09-24 LAB — BASIC METABOLIC PANEL
ANION GAP: 10 (ref 5–15)
BUN: 27 mg/dL — AB (ref 6–20)
CALCIUM: 9.7 mg/dL (ref 8.9–10.3)
CO2: 23 mmol/L (ref 22–32)
Chloride: 103 mmol/L (ref 101–111)
Creatinine, Ser: 1.21 mg/dL — ABNORMAL HIGH (ref 0.44–1.00)
GFR calc Af Amer: 45 mL/min — ABNORMAL LOW (ref >60)
GFR, EST NON AFRICAN AMERICAN: 39 mL/min — AB (ref >60)
GLUCOSE: 103 mg/dL — AB (ref 65–99)
Potassium: 4.7 mmol/L (ref 3.5–5.1)
Sodium: 136 mmol/L (ref 135–145)

## 2016-09-24 LAB — CBC
HCT: 36.2 % (ref 36.0–46.0)
Hemoglobin: 11.6 g/dL — ABNORMAL LOW (ref 12.0–15.0)
MCH: 29 pg (ref 26.0–34.0)
MCHC: 32 g/dL (ref 30.0–36.0)
MCV: 90.5 fL (ref 78.0–100.0)
PLATELETS: 285 10*3/uL (ref 150–400)
RBC: 4 MIL/uL (ref 3.87–5.11)
RDW: 14 % (ref 11.5–15.5)
WBC: 7 10*3/uL (ref 4.0–10.5)

## 2016-09-24 LAB — SURGICAL PCR SCREEN
MRSA, PCR: NEGATIVE
Staphylococcus aureus: NEGATIVE

## 2016-09-24 NOTE — Pre-Procedure Instructions (Addendum)
Victoria NajjarRachel C Mullins  09/24/2016      [facility] - [place], [place] - [address] [place] [id number] Phone: [phone] Fax: [phone]    Your procedure is scheduled on 09/27/16.  [facility] at 800 A.M.  Call this number if you have problems the morning of surgery:  [phone]   Remember:  Do not eat food or drink liquids after midnight.  Take these medicines the morning of surgery with A SIP OF WATER amlodipine  STOP all herbel meds, nsaids (aleve,naproxen,advil,ibuprofen) starting TODAY including aspirin,aspercreme, all vitamins   Do not wear jewelry, make-up or nail polish.  Do not wear lotions, powders, or perfumes, or deoderant.  Do not shave 48 hours prior to surgery.  Men may shave face and neck.  Do not bring valuables to the hospital.  [facility] is not responsible for any belongings or valuables.  Contacts, dentures or bridgework may not be worn into surgery.  Leave your suitcase in the car.  After surgery it may be brought to your room.  For patients admitted to the hospital, discharge time will be determined by your treatment team.  Patients discharged the day of surgery will not be allowed to drive home.   Special instructions:   Special Instructions: [place] - Preparing for Surgery  Before surgery, you can play an important role.  Because skin is not sterile, your skin needs to be as free of germs as possible.  You can reduce the number of germs on you skin by washing with CHG (chlorahexidine gluconate) soap before surgery.  CHG is an antiseptic cleaner which kills germs and bonds with the skin to continue killing germs even after washing.  Please DO NOT use if you have an allergy to CHG or antibacterial soaps.  If your skin becomes reddened/irritated stop using the CHG and inform your nurse when you arrive at Short Stay.  Do not shave (including legs and  underarms) for at least 48 hours prior to the first CHG shower.  You may shave your face.  Please follow these instructions carefully:   1.  Shower with CHG Soap the night before surgery and the morning of Surgery.  2.  If you choose to wash your hair, wash your hair first as usual with your normal shampoo.  3.  After you shampoo, rinse your hair and body thoroughly to remove the Shampoo.  4.  Use CHG as you would any other liquid soap.  You can apply chg directly  to the skin and wash gently with scrungie or a clean washcloth.  5.  Apply the CHG Soap to your body ONLY FROM THE NECK DOWN.  Do not use on open wounds or open sores.  Avoid contact with your eyes ears, mouth and genitals (private parts).  Wash genitals (private parts)       with your normal soap.  6.  Wash thoroughly, paying special attention to the area where your surgery will be performed.  7.  Thoroughly rinse your body with warm water from the neck down.  8.  DO NOT shower/wash with your normal soap after using and rinsing off the CHG Soap.  9.  Pat yourself dry with a clean towel.            10.  Wear clean pajamas.            11.  Place clean sheets on your bed the night  of your first shower and do not sleep with pets.  Day of Surgery  Do not apply any lotions/deodorants the morning of surgery.  Please wear clean clothes to the hospital/surgery center.  Please read over the fact sheets that you were given.

## 2016-09-25 NOTE — Progress Notes (Signed)
Anesthesia Chart Review:  Pt is an 82 year old female scheduled for R reverse shoulder arthroplasty on 09/27/2016 with Beverely LowSteve Norris, MD.   PMH includes:  HTN, hyperlipidemia, GERD. Never smoker. BMI 25  Medications include: amlodipine, ASA, simvastatin, valsartan-hctz.   Preoperative labs reviewed.    EKG 09/24/16: Sinus rhythm with PACs. Minor nonspecific intraventricular conduction delay. New septal Q waves when compared to 08/03/09 EKG may be due to interval septal infarction or conduction abnormality.  Reviewed case with Dr. Okey Dupreose. If no changes, I anticipate pt can proceed with surgery as scheduled.   Rica Mastngela Rashi Giuliani, FNP-BC [facility]Anesthesiology Phone: [phone] 09/25/2016 4:44 PM

## 2016-09-27 ENCOUNTER — Inpatient Hospital Stay (HOSPITAL_COMMUNITY): Payer: Medicare Other

## 2016-09-27 ENCOUNTER — Inpatient Hospital Stay (HOSPITAL_COMMUNITY): Payer: Medicare Other | Admitting: Emergency Medicine

## 2016-09-27 ENCOUNTER — Encounter (HOSPITAL_COMMUNITY)
Admission: RE | Disposition: A | Discharge status: Home / Self Care | Payer: Self-pay | Source: Ambulatory Visit | Attending: Orthopedic Surgery

## 2016-09-27 ENCOUNTER — Inpatient Hospital Stay (HOSPITAL_COMMUNITY)
Admission: RE | Admit: 2016-09-27 | Discharge: 2016-09-28 | DRG: 483 | Disposition: A | Discharge status: Home / Self Care | Payer: Medicare Other | Source: Ambulatory Visit | Attending: Orthopedic Surgery | Admitting: Orthopedic Surgery

## 2016-09-27 ENCOUNTER — Inpatient Hospital Stay (HOSPITAL_COMMUNITY): Payer: Medicare Other | Admitting: Anesthesiology

## 2016-09-27 ENCOUNTER — Encounter (HOSPITAL_COMMUNITY): Payer: Self-pay | Admitting: *Deleted

## 2016-09-27 DIAGNOSIS — M25511 Pain in right shoulder: Secondary | ICD-10-CM | POA: Diagnosis present

## 2016-09-27 DIAGNOSIS — M19011 Primary osteoarthritis, right shoulder: Principal | ICD-10-CM | POA: Diagnosis present

## 2016-09-27 DIAGNOSIS — E785 Hyperlipidemia, unspecified: Secondary | ICD-10-CM | POA: Diagnosis present

## 2016-09-27 DIAGNOSIS — Z96611 Presence of right artificial shoulder joint: Secondary | ICD-10-CM

## 2016-09-27 DIAGNOSIS — Z79899 Other long term (current) drug therapy: Secondary | ICD-10-CM | POA: Diagnosis not present

## 2016-09-27 DIAGNOSIS — I10 Essential (primary) hypertension: Secondary | ICD-10-CM | POA: Diagnosis present

## 2016-09-27 DIAGNOSIS — M75101 Unspecified rotator cuff tear or rupture of right shoulder, not specified as traumatic: Secondary | ICD-10-CM | POA: Diagnosis present

## 2016-09-27 HISTORY — PX: REVERSE SHOULDER ARTHROPLASTY: SHX5054

## 2016-09-27 SURGERY — ARTHROPLASTY, SHOULDER, TOTAL, REVERSE
Anesthesia: General | Site: Shoulder | Laterality: Right

## 2016-09-27 MED ORDER — BUPIVACAINE-EPINEPHRINE (PF) 0.25% -1:200000 IJ SOLN
INTRAMUSCULAR | Status: AC
  Filled 2016-09-27: qty 30

## 2016-09-27 MED ORDER — HYDROCODONE-ACETAMINOPHEN 5-325 MG PO TABS: 1.0000 | ORAL_TABLET | Freq: Four times a day (QID) | ORAL | 0 refills | Status: DC | PRN

## 2016-09-27 MED ORDER — BISACODYL 10 MG RE SUPP: 10.0000 mg | Freq: Every day | RECTAL | Status: DC | PRN

## 2016-09-27 MED ORDER — VITAMIN D 1000 UNITS PO TABS
5000.0000 [IU] | ORAL_TABLET | ORAL | Status: DC
  Filled 2016-09-27: qty 5

## 2016-09-27 MED ORDER — SUGAMMADEX SODIUM 200 MG/2ML IV SOLN
INTRAVENOUS | Status: DC | PRN
  Administered 2016-09-27: 200 mg via INTRAVENOUS

## 2016-09-27 MED ORDER — ADULT MULTIVITAMIN W/MINERALS CH
1.0000 | ORAL_TABLET | Freq: Every day | ORAL | Status: DC
  Administered 2016-09-28: 1 via ORAL
  Filled 2016-09-27: qty 1

## 2016-09-27 MED ORDER — PROPOFOL 10 MG/ML IV BOLUS
INTRAVENOUS | Status: AC
  Filled 2016-09-27: qty 20

## 2016-09-27 MED ORDER — PHENOL 1.4 % MT LIQD: 1.0000 | OROMUCOSAL | Status: DC | PRN

## 2016-09-27 MED ORDER — ACETAMINOPHEN 325 MG PO TABS
650.0000 mg | ORAL_TABLET | Freq: Four times a day (QID) | ORAL | Status: DC | PRN
  Administered 2016-09-27: 650 mg via ORAL
  Filled 2016-09-27: qty 2

## 2016-09-27 MED ORDER — FENTANYL CITRATE (PF) 100 MCG/2ML IJ SOLN
INTRAMUSCULAR | Status: AC
  Filled 2016-09-27: qty 2

## 2016-09-27 MED ORDER — HYDROCODONE-ACETAMINOPHEN 5-325 MG PO TABS
1.0000 | ORAL_TABLET | ORAL | Status: DC | PRN
  Administered 2016-09-27 – 2016-09-28 (×3): 1 via ORAL
  Filled 2016-09-27 (×3): qty 1

## 2016-09-27 MED ORDER — 0.9 % SODIUM CHLORIDE (POUR BTL) OPTIME
TOPICAL | Status: DC | PRN
  Administered 2016-09-27: 1000 mL

## 2016-09-27 MED ORDER — POLYETHYLENE GLYCOL 3350 17 G PO PACK: 17.0000 g | PACK | Freq: Every day | ORAL | Status: DC | PRN

## 2016-09-27 MED ORDER — FENTANYL CITRATE (PF) 100 MCG/2ML IJ SOLN
INTRAMUSCULAR | Status: AC
  Administered 2016-09-27: 50 ug via INTRAVENOUS
  Filled 2016-09-27: qty 2

## 2016-09-27 MED ORDER — ONDANSETRON HCL 4 MG PO TABS: 4.0000 mg | ORAL_TABLET | Freq: Four times a day (QID) | ORAL | Status: DC | PRN

## 2016-09-27 MED ORDER — CEFAZOLIN SODIUM-DEXTROSE 2-4 GM/100ML-% IV SOLN
INTRAVENOUS | Status: AC
  Filled 2016-09-27: qty 100

## 2016-09-27 MED ORDER — METOCLOPRAMIDE HCL 5 MG PO TABS: 5.0000 mg | ORAL_TABLET | Freq: Three times a day (TID) | ORAL | Status: DC | PRN

## 2016-09-27 MED ORDER — MIDAZOLAM HCL 2 MG/2ML IJ SOLN
INTRAMUSCULAR | Status: AC
  Filled 2016-09-27: qty 2

## 2016-09-27 MED ORDER — LACTATED RINGERS IV SOLN
INTRAVENOUS | Status: DC
  Administered 2016-09-27: 09:00:00 via INTRAVENOUS

## 2016-09-27 MED ORDER — PHENYLEPHRINE HCL 10 MG/ML IJ SOLN
INTRAVENOUS | Status: DC | PRN
  Administered 2016-09-27: 25 ug/min via INTRAVENOUS

## 2016-09-27 MED ORDER — BUPIVACAINE-EPINEPHRINE 0.25% -1:200000 IJ SOLN
INTRAMUSCULAR | Status: DC | PRN
  Administered 2016-09-27: 4 mL

## 2016-09-27 MED ORDER — FENTANYL CITRATE (PF) 100 MCG/2ML IJ SOLN
INTRAMUSCULAR | Status: DC | PRN
  Administered 2016-09-27: 50 ug via INTRAVENOUS

## 2016-09-27 MED ORDER — PROPOFOL 10 MG/ML IV BOLUS
INTRAVENOUS | Status: DC | PRN
  Administered 2016-09-27: 20 mg via INTRAVENOUS

## 2016-09-27 MED ORDER — GRX ANALGESIC BALM EX OINT
1.0000 "application " | TOPICAL_OINTMENT | Freq: Every day | CUTANEOUS | Status: DC | PRN
  Filled 2016-09-27: qty 28

## 2016-09-27 MED ORDER — ROPIVACAINE HCL 5 MG/ML IJ SOLN
INTRAMUSCULAR | Status: DC | PRN
  Administered 2016-09-27: 30 mL via PERINEURAL

## 2016-09-27 MED ORDER — MORPHINE SULFATE (PF) 2 MG/ML IV SOLN: 1.0000 mg | INTRAVENOUS | Status: DC | PRN

## 2016-09-27 MED ORDER — AMLODIPINE BESYLATE 5 MG PO TABS
5.0000 mg | ORAL_TABLET | Freq: Every day | ORAL | Status: DC
  Administered 2016-09-28: 5 mg via ORAL
  Filled 2016-09-27: qty 1

## 2016-09-27 MED ORDER — CEFAZOLIN SODIUM-DEXTROSE 2-4 GM/100ML-% IV SOLN
2.0000 g | Freq: Four times a day (QID) | INTRAVENOUS | Status: AC
  Administered 2016-09-27 – 2016-09-28 (×3): 2 g via INTRAVENOUS
  Filled 2016-09-27 (×3): qty 100

## 2016-09-27 MED ORDER — DOCUSATE SODIUM 100 MG PO CAPS
100.0000 mg | ORAL_CAPSULE | Freq: Two times a day (BID) | ORAL | Status: DC
  Administered 2016-09-27: 100 mg via ORAL
  Filled 2016-09-27: qty 1

## 2016-09-27 MED ORDER — VALSARTAN-HYDROCHLOROTHIAZIDE 160-12.5 MG PO TABS: 2.0000 | ORAL_TABLET | Freq: Every day | ORAL | Status: DC

## 2016-09-27 MED ORDER — SIMVASTATIN 20 MG PO TABS
20.0000 mg | ORAL_TABLET | Freq: Every day | ORAL | Status: DC
  Administered 2016-09-27: 20 mg via ORAL
  Filled 2016-09-27: qty 1

## 2016-09-27 MED ORDER — METOCLOPRAMIDE HCL 5 MG/ML IJ SOLN
5.0000 mg | Freq: Three times a day (TID) | INTRAMUSCULAR | Status: DC | PRN
Start: 2016-09-27 — End: 2016-09-28

## 2016-09-27 MED ORDER — ONDANSETRON HCL 4 MG/2ML IJ SOLN
INTRAMUSCULAR | Status: AC
  Filled 2016-09-27: qty 2

## 2016-09-27 MED ORDER — ASPIRIN EC 81 MG PO TBEC
81.0000 mg | DELAYED_RELEASE_TABLET | Freq: Every day | ORAL | Status: DC
  Administered 2016-09-27: 81 mg via ORAL
  Filled 2016-09-27: qty 1

## 2016-09-27 MED ORDER — MENTHOL 3 MG MT LOZG: 1.0000 | LOZENGE | OROMUCOSAL | Status: DC | PRN

## 2016-09-27 MED ORDER — ACETAMINOPHEN 650 MG RE SUPP: 650.0000 mg | Freq: Four times a day (QID) | RECTAL | Status: DC | PRN

## 2016-09-27 MED ORDER — HYDROCHLOROTHIAZIDE 25 MG PO TABS
25.0000 mg | ORAL_TABLET | Freq: Every day | ORAL | Status: DC
  Administered 2016-09-28: 25 mg via ORAL
  Filled 2016-09-27: qty 1

## 2016-09-27 MED ORDER — IRBESARTAN 300 MG PO TABS
300.0000 mg | ORAL_TABLET | Freq: Every day | ORAL | Status: DC
  Administered 2016-09-28: 300 mg via ORAL
  Filled 2016-09-27: qty 1

## 2016-09-27 MED ORDER — ONDANSETRON HCL 4 MG/2ML IJ SOLN: 4.0000 mg | Freq: Four times a day (QID) | INTRAMUSCULAR | Status: DC | PRN

## 2016-09-27 MED ORDER — IBUPROFEN 200 MG PO CAPS: 200.0000 mg | ORAL_CAPSULE | Freq: Four times a day (QID) | ORAL | Status: DC | PRN

## 2016-09-27 MED ORDER — FENTANYL CITRATE (PF) 100 MCG/2ML IJ SOLN
50.0000 ug | Freq: Once | INTRAMUSCULAR | Status: AC
  Administered 2016-09-27: 50 ug via INTRAVENOUS
  Filled 2016-09-27: qty 1

## 2016-09-27 MED ORDER — SUGAMMADEX SODIUM 200 MG/2ML IV SOLN
INTRAVENOUS | Status: AC
  Filled 2016-09-27: qty 2

## 2016-09-27 MED ORDER — CEFAZOLIN SODIUM-DEXTROSE 2-4 GM/100ML-% IV SOLN
2.0000 g | INTRAVENOUS | Status: AC
  Administered 2016-09-27: 2 g via INTRAVENOUS

## 2016-09-27 MED ORDER — SODIUM CHLORIDE 0.9 % IV SOLN
INTRAVENOUS | Status: DC
  Administered 2016-09-27: 13:00:00 via INTRAVENOUS

## 2016-09-27 MED ORDER — CHLORHEXIDINE GLUCONATE 4 % EX LIQD: 60.0000 mL | Freq: Once | CUTANEOUS | Status: DC

## 2016-09-27 SURGICAL SUPPLY — 67 items
BIT DRILL 5/64X5 DISP (BIT) ×3 IMPLANT
BLADE SAG 18X100X1.27 (BLADE) ×3 IMPLANT
BOWL SMART MIX CTS (DISPOSABLE) ×2 IMPLANT
CAPT SHLDR REVTOTAL 2 ×2 IMPLANT
CEMENT BONE DEPUY (Cement) ×2 IMPLANT
CLOSURE WOUND 1/2 X4 (GAUZE/BANDAGES/DRESSINGS) ×1
COVER SURGICAL LIGHT HANDLE (MISCELLANEOUS) ×3 IMPLANT
DRAPE IMP U-DRAPE 54X76 (DRAPES) ×6 IMPLANT
DRAPE INCISE IOBAN 66X45 STRL (DRAPES) ×3 IMPLANT
DRAPE ORTHO SPLIT 77X108 STRL (DRAPES) ×6
DRAPE SURG ORHT 6 SPLT 77X108 (DRAPES) ×2 IMPLANT
DRAPE U-SHAPE 47X51 STRL (DRAPES) ×3 IMPLANT
DRAPE X-RAY CASS 24X20 (DRAPES) IMPLANT
DRSG ADAPTIC 3X8 NADH LF (GAUZE/BANDAGES/DRESSINGS) ×3 IMPLANT
DRSG PAD ABDOMINAL 8X10 ST (GAUZE/BANDAGES/DRESSINGS) ×3 IMPLANT
DURAPREP 26ML APPLICATOR (WOUND CARE) ×3 IMPLANT
ELECT BLADE 4.0 EZ CLEAN MEGAD (MISCELLANEOUS) ×3
ELECT NDL TIP 2.8 STRL (NEEDLE) ×1 IMPLANT
ELECT NEEDLE TIP 2.8 STRL (NEEDLE) ×3 IMPLANT
ELECT REM PT RETURN 9FT ADLT (ELECTROSURGICAL) ×3
ELECTRODE BLDE 4.0 EZ CLN MEGD (MISCELLANEOUS) ×1 IMPLANT
ELECTRODE REM PT RTRN 9FT ADLT (ELECTROSURGICAL) ×1 IMPLANT
GAUZE SPONGE 4X4 12PLY STRL (GAUZE/BANDAGES/DRESSINGS) ×3 IMPLANT
GLOVE BIOGEL PI ORTHO PRO 7.5 (GLOVE) ×2
GLOVE BIOGEL PI ORTHO PRO SZ8 (GLOVE) ×2
GLOVE ORTHO TXT STRL SZ7.5 (GLOVE) ×3 IMPLANT
GLOVE PI ORTHO PRO STRL 7.5 (GLOVE) ×1 IMPLANT
GLOVE PI ORTHO PRO STRL SZ8 (GLOVE) ×1 IMPLANT
GLOVE SURG ORTHO 8.5 STRL (GLOVE) ×3 IMPLANT
GOWN STRL REUS W/ TWL LRG LVL3 (GOWN DISPOSABLE) ×1 IMPLANT
GOWN STRL REUS W/ TWL XL LVL3 (GOWN DISPOSABLE) ×2 IMPLANT
GOWN STRL REUS W/TWL LRG LVL3 (GOWN DISPOSABLE) ×3
GOWN STRL REUS W/TWL XL LVL3 (GOWN DISPOSABLE) ×6
HANDPIECE INTERPULSE COAX TIP (DISPOSABLE)
KIT BASIN OR (CUSTOM PROCEDURE TRAY) ×3 IMPLANT
KIT ROOM TURNOVER OR (KITS) ×3 IMPLANT
MANIFOLD NEPTUNE II (INSTRUMENTS) ×3 IMPLANT
NDL 1/2 CIR MAYO (NEEDLE) ×1 IMPLANT
NDL HYPO 25GX1X1/2 BEV (NEEDLE) ×1 IMPLANT
NEEDLE 1/2 CIR MAYO (NEEDLE) IMPLANT
NEEDLE HYPO 25GX1X1/2 BEV (NEEDLE) ×3 IMPLANT
NS IRRIG 1000ML POUR BTL (IV SOLUTION) ×3 IMPLANT
PACK SHOULDER (CUSTOM PROCEDURE TRAY) ×3 IMPLANT
PAD ARMBOARD 7.5X6 YLW CONV (MISCELLANEOUS) ×6 IMPLANT
SET HNDPC FAN SPRY TIP SCT (DISPOSABLE) IMPLANT
SLING ARM LRG ADULT FOAM STRAP (SOFTGOODS) ×2 IMPLANT
SLING ARM MED ADULT FOAM STRAP (SOFTGOODS) IMPLANT
SPONGE LAP 18X18 X RAY DECT (DISPOSABLE) ×2 IMPLANT
SPONGE LAP 4X18 X RAY DECT (DISPOSABLE) ×3 IMPLANT
STRIP CLOSURE SKIN 1/2X4 (GAUZE/BANDAGES/DRESSINGS) ×2 IMPLANT
SUCTION FRAZIER HANDLE 10FR (MISCELLANEOUS) ×2
SUCTION TUBE FRAZIER 10FR DISP (MISCELLANEOUS) ×1 IMPLANT
SUT FIBERWIRE #2 38 T-5 BLUE (SUTURE) ×6
SUT MNCRL AB 4-0 PS2 18 (SUTURE) ×3 IMPLANT
SUT VIC AB 0 CT2 27 (SUTURE) ×2 IMPLANT
SUT VIC AB 2-0 CT1 27 (SUTURE) ×3
SUT VIC AB 2-0 CT1 TAPERPNT 27 (SUTURE) ×1 IMPLANT
SUT VICRYL 0 CT 1 36IN (SUTURE) ×3 IMPLANT
SUTURE FIBERWR #2 38 T-5 BLUE (SUTURE) ×2 IMPLANT
SYR CONTROL 10ML LL (SYRINGE) ×3 IMPLANT
TAPE CLOTH SURG 6X10 WHT LF (GAUZE/BANDAGES/DRESSINGS) ×2 IMPLANT
TOWEL OR 17X24 6PK STRL BLUE (TOWEL DISPOSABLE) ×3 IMPLANT
TOWEL OR 17X26 10 PK STRL BLUE (TOWEL DISPOSABLE) ×3 IMPLANT
TOWER CARTRIDGE SMART MIX (DISPOSABLE) IMPLANT
TRAY FOLEY CATH 16FRSI W/METER (SET/KITS/TRAYS/PACK) IMPLANT
WATER STERILE IRR 1000ML POUR (IV SOLUTION) ×3 IMPLANT
YANKAUER SUCT BULB TIP NO VENT (SUCTIONS) ×3 IMPLANT

## 2016-09-27 NOTE — Discharge Instructions (Signed)
Ice to the shoulder as much as possible.  Ok to remove the sling and move the arm as you would like.  Ok to use the right arm for balancing but try to avoid pushing your whole weight up with the arm.  Keep the incision covered and clean and dry for one week, then ok to get wet in the shower.  Follow up in two weeks in the office [phone]

## 2016-09-27 NOTE — Anesthesia Procedure Notes (Addendum)
Anesthesia Regional Block:  Supraclavicular block  Pre-Anesthetic Checklist: ,, timeout performed, Correct Patient, Correct Site, Correct Laterality, Correct Procedure, Correct Position, site marked, Risks and benefits discussed,  Surgical consent,  Pre-op evaluation,  At surgeon's request and post-op pain management  Laterality: Right and Upper  Prep: Maximum Sterile Barrier Precautions used, chloraprep       Needles:  Injection technique: Single-shot  Needle Type: Echogenic Stimulator Needle     Needle Length: 10cm 10 cm Needle Gauge: 21 G    Additional Needles:  Procedures: ultrasound guided (picture in chart) Supraclavicular block Narrative:  Injection made incrementally with aspirations every 5 mL.  Performed by: Personally  Anesthesiologist: Leva Baine  Additional Notes: Risks, benefits and alternative to block explained extensively.  Patient tolerated procedure well, without complications.        

## 2016-09-27 NOTE — Anesthesia Preprocedure Evaluation (Addendum)
Anesthesia Evaluation  Patient identified by MRN, date of birth, ID band Patient awake    Reviewed: Allergy & Precautions, NPO status , Patient's Chart, lab work & pertinent test results  Airway Mallampati: I  TM Distance: >3 FB Neck ROM: Full    Dental no notable dental hx. (+) Poor Dentition   Pulmonary neg pulmonary ROS,    Pulmonary exam normal breath sounds clear to auscultation       Cardiovascular hypertension, Pt. on medications Normal cardiovascular exam Rhythm:Regular Rate:Normal     Neuro/Psych negative neurological ROS  negative psych ROS   GI/Hepatic negative GI ROS, Neg liver ROS,   Endo/Other  negative endocrine ROS  Renal/GU negative Renal ROS  negative genitourinary   Musculoskeletal negative musculoskeletal ROS (+)   Abdominal   Peds negative pediatric ROS (+)  Hematology negative hematology ROS (+)   Anesthesia Other Findings   Reproductive/Obstetrics negative OB ROS                            Anesthesia Physical Anesthesia Plan  ASA: II  Anesthesia Plan: General   Post-op Pain Management:  Regional for Post-op pain   Induction: Intravenous  Airway Management Planned: Oral ETT  Additional Equipment:   Intra-op Plan:   Post-operative Plan: Extubation in OR  Informed Consent: I have reviewed the patients History and Physical, chart, labs and discussed the procedure including the risks, benefits and alternatives for the proposed anesthesia with the patient or authorized representative who has indicated his/her understanding and acceptance.   Dental advisory given  Plan Discussed with: CRNA  Anesthesia Plan Comments: (SCB)      Anesthesia Quick Evaluation

## 2016-09-27 NOTE — Anesthesia Procedure Notes (Signed)
Procedure Name: Intubation Date/Time: 09/27/2016 10:07 AM Performed by: Sharlene DoryWALKER, Aashir Umholtz E Pre-anesthesia Checklist: Patient identified, Emergency Drugs available, Suction available and Patient being monitored Patient Re-evaluated:Patient Re-evaluated prior to inductionOxygen Delivery Method: Circle system utilized Preoxygenation: Pre-oxygenation with 100% oxygen Intubation Type: IV induction Ventilation: Mask ventilation without difficulty Laryngoscope Size: Mac and 4 Grade View: Grade I Tube type: Oral Tube size: 7.0 mm Number of attempts: 1 Airway Equipment and Method: Stylet Placement Confirmation: ETT inserted through vocal cords under direct vision,  positive ETCO2 and breath sounds checked- equal and bilateral Secured at: 20 cm Dental Injury: Teeth and Oropharynx as per pre-operative assessment

## 2016-09-27 NOTE — Progress Notes (Signed)
Pt was asleep when stopped by to visit. Chaplain available for follow-up.   09/27/16 1600  Clinical Encounter Type  Visited With Patient not available  Visit Type Initial;Post-op   [name], [address]

## 2016-09-27 NOTE — Brief Op Note (Signed)
09/27/2016  5:03 PM  PATIENT:  Peyton Najjarachel C Diskin  82 y.o. female  PRE-OPERATIVE DIAGNOSIS:  Right Shoulder Rotator Cuff Arthropathy  POST-OPERATIVE DIAGNOSIS:  Right Shoulder Rotator Cuff Arthropathy  PROCEDURE:  Procedure(s): RIGHT REVERSE SHOULDER ARTHROPLASTY (Right) DePuy Delta xtend  SURGEON:  Surgeon(s) and Role:    * Beverely LowSteve Aariah Godette, MD - Primary  PHYSICIAN ASSISTANT:   ASSISTANTS: Thea Gisthomas B Dixon, PA-C   ANESTHESIA:   regional and general  EBL:  Total I/O In: 800 [I.V.:800] Out: 150 [Blood:150]  BLOOD ADMINISTERED:none  DRAINS: none   LOCAL MEDICATIONS USED:  MARCAINE     SPECIMEN:  No Specimen  DISPOSITION OF SPECIMEN:  N/A  COUNTS:  YES  TOURNIQUET:  * No tourniquets in log *  DICTATION: .Other Dictation: Dictation Number [id number]  PLAN OF CARE: Admit to inpatient   PATIENT DISPOSITION:  PACU - hemodynamically stable.   Delay start of Pharmacological VTE agent (>24hrs) due to surgical blood loss or risk of bleeding: no

## 2016-09-27 NOTE — Anesthesia Postprocedure Evaluation (Signed)
Anesthesia Post Note  Patient: Peyton NajjarRachel C Nish  Procedure(s) Performed: Procedure(s) (LRB): RIGHT REVERSE SHOULDER ARTHROPLASTY (Right)  Patient location during evaluation: PACU Anesthesia Type: General and Regional Level of consciousness: awake and alert Pain management: pain level controlled Vital Signs Assessment: post-procedure vital signs reviewed and stable Respiratory status: spontaneous breathing, nonlabored ventilation, respiratory function stable and patient connected to nasal cannula oxygen Cardiovascular status: blood pressure returned to baseline and stable Postop Assessment: no signs of nausea or vomiting Anesthetic complications: no    Last Vitals:  Vitals:   09/27/16 1245 09/27/16 1300  BP: (!) 123/51   Pulse: 70 70  Resp: 17 18  Temp:      Last Pain:  Vitals:   09/27/16 1300  TempSrc:   PainSc: 0-No pain                 Phillips Groutarignan, Nanda Bittick

## 2016-09-27 NOTE — Interval H&P Note (Signed)
History and Physical Interval Note:  09/27/2016 9:40 AM  [name]  has presented today for surgery, with the diagnosis of Right Shoulder Rotator Cuff Arthropathy  The various methods of treatment have been discussed with the patient and family. After consideration of risks, benefits and other options for treatment, the patient has consented to  Procedure(s): RIGHT REVERSE SHOULDER ARTHROPLASTY (Right) as a surgical intervention .  The patient's history has been reviewed, patient examined, no change in status, stable for surgery.  I have reviewed the patient's chart and labs.  Questions were answered to the patient's satisfaction.     Dyami Umbach,STEVEN R

## 2016-09-27 NOTE — Transfer of Care (Signed)
Immediate Anesthesia Transfer of Care Note  Patient: Victoria NajjarRachel C Blinder  Procedure(s) Performed: Procedure(s): RIGHT REVERSE SHOULDER ARTHROPLASTY (Right)  Patient Location: PACU  Anesthesia Type:GA combined with regional for post-op pain  Level of Consciousness: awake, alert  and oriented  Airway & Oxygen Therapy: Patient Spontanous Breathing and Patient connected to nasal cannula oxygen  Post-op Assessment: Report given to RN, Post -op Vital signs reviewed and stable and Patient moving all extremities  Post vital signs: Reviewed and stable  Last Vitals:  Vitals:   09/27/16 0858 09/27/16 1203  BP: (!) 162/48   Pulse: 69   Resp: 18 (P) 16  Temp: 37 C (P) 36.3 C    Last Pain:  Vitals:   09/27/16 0919  TempSrc:   PainSc: 5          Complications: No apparent anesthesia complications

## 2016-09-28 LAB — BASIC METABOLIC PANEL
Anion gap: 11 (ref 5–15)
BUN: 20 mg/dL (ref 6–20)
CALCIUM: 8.2 mg/dL — AB (ref 8.9–10.3)
CO2: 21 mmol/L — AB (ref 22–32)
CREATININE: 1.18 mg/dL — AB (ref 0.44–1.00)
Chloride: 101 mmol/L (ref 101–111)
GFR calc non Af Amer: 40 mL/min — ABNORMAL LOW (ref >60)
GFR, EST AFRICAN AMERICAN: 47 mL/min — AB (ref >60)
Glucose, Bld: 150 mg/dL — ABNORMAL HIGH (ref 65–99)
Potassium: 3.7 mmol/L (ref 3.5–5.1)
SODIUM: 133 mmol/L — AB (ref 135–145)

## 2016-09-28 LAB — HEMOGLOBIN AND HEMATOCRIT, BLOOD
HEMATOCRIT: 28.3 % — AB (ref 36.0–46.0)
Hemoglobin: 9.2 g/dL — ABNORMAL LOW (ref 12.0–15.0)

## 2016-09-28 NOTE — Evaluation (Addendum)
Occupational Therapy Evaluation Patient Details Name: Victoria NajjarRachel C Latif MRN: [id number] DOB: 1927-06-09 Today's Date: 09/28/2016    History of Present Illness Pt is an 82 y.o. female s/p R reverse TSA. PMHx: Hyperlipemia, HTN, Back sx, Hip sx, Shoulder sx.   Clinical Impression   Pt reports she was independent with ADL and used a cane for mobility PTA. Currently pt overall supervision for safety with ADL and min hand held assist for functional mobility due to no cane present. Pt tolerating RUE exercises well. All shoulder, safety, and ADL education completed with pt and her friend. Pt planning to d/c home with 24/7 supervision from friends. Pt OK to d/c home from OT standpoint but will continue to follow acutely to maximize independence and safety with ADL and functional mobility.    Follow Up Recommendations  Supervision/Assistance - 24 hour (follow up per MD)    Equipment Recommendations  None recommended by OT    Recommendations for Other Services       Precautions / Restrictions Precautions Precautions: Fall;Shoulder Type of Shoulder Precautions: Active protocol: AROM elbow/wrist/hand OK. AROM/PROM shoulder FF 0-140, ABD 0-60, ER 30. Shoulder Interventions: Shoulder sling/immobilizer;For comfort (and sleep) Precaution Booklet Issued: Yes (comment) Precaution Comments: Educated pt and friend on shoulder precautions. Required Braces or Orthoses: Sling Restrictions Weight Bearing Restrictions: Yes RUE Weight Bearing: Non weight bearing      Mobility Bed Mobility Overal bed mobility: Needs Assistance Bed Mobility: Supine to Sit     Supine to sit: Supervision     General bed mobility comments: Supervision for safety. HOB slightly elevated without use of bed rail.  Transfers Overall transfer level: Needs assistance Equipment used: None Transfers: Sit to/from Stand Sit to Stand: Min guard         General transfer comment: Min guard for safety. No physical assist  required. Min hand held assist for mobility due to no cane present in room.    Balance Overall balance assessment: Needs assistance Sitting-balance support: Feet supported;No upper extremity supported Sitting balance-Leahy Scale: Good     Standing balance support: Single extremity supported Standing balance-Leahy Scale: Fair                              ADL Overall ADL's : Needs assistance/impaired Eating/Feeding: Set up;Sitting   Grooming: Supervision/safety;Standing   Upper Body Bathing: Supervision/ safety;Sitting   Lower Body Bathing: Min guard;Sit to/from stand   Upper Body Dressing : Supervision/safety;Sitting   Lower Body Dressing: Min guard;Sit to/from stand   Toilet Transfer: Minimal assistance;Ambulation;Regular Toilet (min hand held assist due to no cane in room)   Toileting- ArchitectClothing Manipulation and Hygiene: Min guard;Sit to/from stand       Functional mobility during ADLs: Minimal assistance (min hand held assist due to no cane in room) General ADL Comments: Educated pt on shoulder precautions, NWB status, UB bathing/dressing technique, proper positioning for bed/chair, sling management and wear schedule, home safety and fall prevention for shower, ice for edema and pain .     Vision Vision Assessment?: No apparent visual deficits   Perception     Praxis      Pertinent Vitals/Pain Pain Assessment: 0-10 Pain Score: 5  Pain Location: R shoulder Pain Descriptors / Indicators: Aching;Guarding;Grimacing;Sore Pain Intervention(s): Premedicated before session;Monitored during session;Limited activity within patient's tolerance;Repositioned;Ice applied     Hand Dominance Right   Extremity/Trunk Assessment Upper Extremity Assessment Upper Extremity Assessment: RUE deficits/detail RUE Deficits / Details:  elbow/wrist/hand ROM WFL. Sensation intact. Shoulder ROM limitation to be expected following TSA.   Lower Extremity Assessment Lower  Extremity Assessment: Overall WFL for tasks assessed   Cervical / Trunk Assessment Cervical / Trunk Assessment: Kyphotic   Communication Communication Communication: HOH   Cognition Arousal/Alertness: Awake/alert Behavior During Therapy: WFL for tasks assessed/performed Overall Cognitive Status: Within Functional Limits for tasks assessed                     General Comments       Exercises Exercises: Shoulder     Shoulder Instructions Shoulder Instructions Donning/doffing shirt without moving shoulder: Set-up;Supervision/safety Method for sponge bathing under operated UE: Set-up;Supervision/safety Donning/doffing sling/immobilizer: Set-up;Supervision/safety Correct positioning of sling/immobilizer: Set-up;Supervision/safety ROM for elbow, wrist and digits of operated UE: Supervision/safety Sling wearing schedule (on at all times/off for ADL's): Supervision/safety Proper positioning of operated UE when showering: Supervision/safety Positioning of UE while sleeping: Supervision/safety    Home Living Family/patient expects to be discharged to:: Private residence Living Arrangements: Alone Available Help at Discharge: Friend(s);Available 24 hours/day (initially) Type of Home: House Home Access: Stairs to enter Entergy CorporationEntrance Stairs-Number of Steps: 2   Home Layout: One level     Bathroom Shower/Tub: Producer, television/film/videoWalk-in shower   Bathroom Toilet: Handicapped height     Home Equipment: Cane - single point;Shower seat   Additional Comments: Friend planning to stay with pt 24/7 initially upon d/c home.      Prior Functioning/Environment Level of Independence: Independent with assistive device(s)        Comments: cane for mobility. Very independent with all ADL.        OT Problem List: Decreased range of motion;Decreased knowledge of precautions;Impaired UE functional use;Pain   OT Treatment/Interventions: Self-care/ADL training;Therapeutic exercise;DME and/or AE  instruction;Therapeutic activities;Patient/family education    OT Goals(Current goals can be found in the care plan section) Acute Rehab OT Goals Patient Stated Goal: home today OT Goal Formulation: With patient Time For Goal Achievement: 10/12/16 Potential to Achieve Goals: Good ADL Goals Pt Will Perform Upper Body Bathing: with modified independence;sitting Pt Will Perform Upper Body Dressing: with modified independence;sitting Additional ADL Goal #1: Pt will don/doff sling with set up as precursor to ADL and functional mobility. Additional ADL Goal #2: Pt will independently perform RUE exercises.   OT Frequency: Min 3X/week   Barriers to D/C:            Co-evaluation              End of Session Equipment Utilized During Treatment: Other (comment) (sling) Nurse Communication: Mobility status;Other (comment) (pt ready for d/c home)  Activity Tolerance: Patient tolerated treatment well Patient left: in chair;with call bell/phone within reach;with family/visitor present   Time: [id number] OT Time Calculation (min): 29 min Charges:  OT General Charges $OT Visit: 1 Procedure OT Evaluation $OT Eval Moderate Complexity: 1 Procedure OT Treatments $Therapeutic Exercise: 8-22 mins G-Codes:     Gaye AlkenBailey A Mana Haberl M.S., OTR/L Pager: [phone]  09/28/2016, 10:37 AM

## 2016-09-28 NOTE — Progress Notes (Signed)
Victoria NajjarRachel C Mullins  MRN: [id number] DOB/Age: 1926/11/03 82 y.o. Physician: [name], M.D. 1 Day Post-Op Procedure(s) (LRB): RIGHT REVERSE SHOULDER ARTHROPLASTY (Right)  Subjective: Restless night, pain relatively well controlled with oral meds. No N/V. Anxious to go home Vital Signs Temp:  [97.4 F (36.3 C)-98.8 F (37.1 C)] 98.4 F (36.9 C) (12/02 0652) Pulse Rate:  [70-79] 70 (12/02 0652) Resp:  [15-22] 16 (12/02 0652) BP: (107-130)/(46-81) 115/48 (12/02 0652) SpO2:  [96 %-100 %] 96 % (12/02 0652)  Lab Results  Recent Labs  09/28/16 0554  HGB 9.2*  HCT 28.3*   BMET  Recent Labs  09/28/16 0554  NA 133*  K 3.7  CL 101  CO2 21*  GLUCOSE 150*  BUN 20  CREATININE 1.18*  CALCIUM 8.2*   No results found for: INR   Exam  Walking with OT to bedside chair, dressings dry, n/v intact RUE.  Plan D/C home today after OT, f/u with Dr. Ranell PatrickNorris, Rx's on chart [facility] LLCKEVIN M Jenise Mullins 09/28/2016, 10:03 AM    Contact # [phone]

## 2016-09-30 ENCOUNTER — Encounter (HOSPITAL_COMMUNITY): Payer: Self-pay | Admitting: Orthopedic Surgery

## 2016-09-30 NOTE — Op Note (Signed)
NAMClydene Mullins:  Mullins, [name]               ACCOUNT NO.:  [id number]  MEDICAL RECORD NO.:  [id number]  LOCATION:                                 FACILITY:  PHYSICIAN:  Almedia BallsSteven R. Ranell PatrickNorris, M.D. DATE OF BIRTH:  28-Feb-1927  DATE OF PROCEDURE:  09/27/2016 DATE OF DISCHARGE:                              OPERATIVE REPORT   PREOPERATIVE DIAGNOSIS:  Right shoulder rotator cuff tear arthropathy.  POSTOPERATIVE DIAGNOSIS:  Right shoulder rotator cuff tear arthropathy.  PROCEDURE PERFORMED:  Right shoulder reverse total shoulder arthroplasty using DePuy Delta Xtend prosthesis.  ATTENDING SURGEON:  Almedia BallsSteven R. Ranell PatrickNorris, M.D.  ASSISTANT:  Donnie Coffinhomas B. Dixon, PA-C, who scrubbed the entire procedure and necessary for satisfactory completion of surgery.  ANESTHESIA:  General anesthesia was used plus interscalene block.  ESTIMATED BLOOD LOSS:  150 mL.  FLUID REPLACED:  1200 mL crystalloid.  INSTRUMENT COUNTS:  Correct.  COMPLICATIONS:  There were no complications.  ANTIBIOTICS:  Perioperative antibiotics were given.  INDICATIONS:  The patient is an 82 year old female with worsening right shoulder pain secondary to rotator cuff tear arthropathy.  The patient has superior migration on plain radiographs and progressive cartilage loss.  The patient has now lack of fixed fulcrum mechanics and she has not had functional range of motion.  The patient has disabling pain, desires reverse total shoulder arthroplasty.  She has been fully cleared medically prior to surgical consideration.  Discussed all options for management with patient, she elected to proceed with surgical treatment to restore function and eliminate pain to her shoulder.  Informed consent obtained.  DESCRIPTION OF PROCEDURE:  After an adequate level of anesthesia was achieved, the patient was positioned in modified beach-chair position. The right shoulder was correctly identified, sterilely prepped and draped in usual manner.  Time-out was  called.  We entered the patient's shoulder using standard deltopectoral incision, started at the coracoid process extending down to the anterior humerus.  Dissection carried down through subcutaneous tissues.  Identified the cephalic vein, took it laterally to the deltoid, pectoralis taken medially.  Extensive scar tissue released from the subdeltoid plane.  The biceps tendon was tenodesed in situ with 0 Vicryl suture figure-of-eight x2.  We released the subscapularis off the lesser tuberosity.  We encountered quite a bit of suture material and immediately encountered calcific deposits within the tendon, earlier tendon essentially had almost completely calcified and we did go ahead and resect that off lesser tuberosity and then released the capsule which also had what appeared to be chondrocalcinosis deposits or CPP deposits extensively throughout the capsule.  We released that, progressively externally rotated, then released the biceps proximally and released the scarred by the entire rotator cuff, which appeared to be nonfunctional, this was completely released to the teres minor area.  Extensive calcific deposits removed from the shoulder, large chunks and bony debris were removed, 5 Mitek G1 and G2 anchors were removed, metal anchors as well as multiple PEEK anchor material and suture material.  Once we had the proximal humerus exposed, we entered the proximal humerus with a 6 mm reamer.  We were able to ream up to a size 8, attempted a 10, but we just could not pass,  thus we selected a size 8 and decided on a hybrid type system, where we reduced an 8 Porocoat stem with an epi-1 right metaphyseal HA coated metaphyseal component.  Once we entered the humerus and reamed up, we placed our 8 mm resection guide to resect our humeral head.  We went ahead and did that with an oscillating saw.  We kept that bone graft for potentially at the end of the surgery.  We then removed  excess osteophyte of the humeral side and then reamed for the epi-1 right metaphysis, encountering a little bit more anchor material which we removed.  At this point, we went ahead and trialed the 8 stem and epi-1 right set on the 0 setting and placed in 10 degrees retroversion.  We then removed that, retracted the humerus posteriorly, performed a 360- degree capsulectomy removing that pathologic and calcified capsule and the remnant of the rotator cuff with all those calcium deposits and really getting exposure of our glenoid face, we found center point with our guidepin, it was basically the inclination, the glenoid was inclined superiorly and also posteriorly and we had to re-incline the glenoid face more inferiorly and more anteriorly for the placement of the glenoid baseplate.  Once we had placed our guidepin, we reamed and this was eccentric reaming to get back to an appropriate version.  We then did our peripheral hand reaming.  We drilled our central peg hole,, impacted the metaglene in position, we were able to get excellent screws inferiorly and superiorly, 42 inferiorly, 36 superiorly and then also got a 24 anteriorly.  So we had good purchase with our screws, nice baseplate secured, we then took a 38 eccentric glenosphere and impacted that position, screwed it home with the eccentricity dialed posteriorly with good coverage and nice stability.  We then went ahead and removed the retractors, irrigated thoroughly, checked axillary nerve which was in good condition and we then went ahead and irrigated thoroughly the humeral canal.  We then took our real component which was the size 8 Porocoat and the HA coated epi-1 right metaphysis attached those together, again set in the 0 setting and we placed that in the humerus. We did use vacuum mixed cement and hand packed small amount of cement that would be basically at the metaphyseal-diaphyseal junction, end of the canal there, after  we irrigated and dried it and then we allowed the HA part to be on native bound proximally.  Once the cement was allowed to harden, we used a 38 +3 poly, impacted that in position, we were able to get the shoulder reduced with a nice pop, it was nice and stable.  No gapping with pull on the elbow and external rotation and the conjoined tendon was nice and tight.  We irrigated it thoroughly.  We then went ahead and closed the deltopectoral interval with 0 Vicryl suture followed by 2-0 Vicryl for subcutaneous closure and 4-0 Monocryl for skin.  Steri-Strips applied followed by sterile dressing.  The patient tolerated the surgery well.     Almedia BallsSteven R. Ranell PatrickNorris, M.D.   ______________________________ Almedia BallsSteven R. Ranell PatrickNorris, M.D.    SRN/MEDQ  D:  09/27/2016  T:  09/28/2016  Job:  [id number]

## 2016-09-30 NOTE — Addendum Note (Signed)
Addendum  created 09/30/16 1520 by Phillips GroutPeter Dreon Pineda, MD   Anesthesia Intra Blocks edited, Sign clinical note

## 2016-10-01 NOTE — Discharge Summary (Signed)
Physician Discharge Summary   Patient ID: Victoria NajjarRachel C Kuper MRN: [id number] DOB/AGE: 80-Nov-1928 80 y.o.  Admit date: 09/27/2016 Discharge date: 09/29/16  Admission Diagnoses:  Active Problems:   S/P shoulder replacement, right   Discharge Diagnoses:  Same   Surgeries: Procedure(s): RIGHT REVERSE SHOULDER ARTHROPLASTY on 09/27/2016   Consultants: PT/OT  Discharged Condition: Stable  Hospital Course: Victoria NajjarRachel C Brew is an 82 y.o. female who was admitted 09/27/2016 with a chief complaint of right shoulder pain, and found to have a diagnosis of right shoulder cuff arthropathy.  They were brought to the operating room on 09/27/2016 and underwent the above named procedures.    The patient had an uncomplicated hospital course and was stable for discharge.  Recent vital signs:  Vitals:   09/27/16 2145 09/28/16 0652  BP: (!) 109/46 (!) 115/48  Pulse: 79 70  Resp: 15 16  Temp: 98.8 F (37.1 C) 98.4 F (36.9 C)    Recent laboratory studies:  Results for orders placed or performed during the hospital encounter of 09/27/16  Hemoglobin and hematocrit, blood  Result Value Ref Range   Hemoglobin 9.2 (L) 12.0 - 15.0 g/dL   HCT 40.928.3 (L) 81.136.0 - 91.446.0 %  Basic metabolic panel  Result Value Ref Range   Sodium 133 (L) 135 - 145 mmol/L   Potassium 3.7 3.5 - 5.1 mmol/L   Chloride 101 101 - 111 mmol/L   CO2 21 (L) 22 - 32 mmol/L   Glucose, Bld 150 (H) 65 - 99 mg/dL   BUN 20 6 - 20 mg/dL   Creatinine, Ser 7.821.18 (H) 0.44 - 1.00 mg/dL   Calcium 8.2 (L) 8.9 - 10.3 mg/dL   GFR calc non Af Amer 40 (L) >60 mL/min   GFR calc Af Amer 47 (L) >60 mL/min   Anion gap 11 5 - 15    Discharge Medications:     Medication List    TAKE these medications   amLODipine 5 MG tablet Commonly known as:  NORVASC Take 5 mg by mouth daily.   aspirin EC 81 MG tablet Take 81 mg by mouth daily.   cholecalciferol 1000 units tablet Commonly known as:  VITAMIN D Take 5,000 Units by mouth once a week.     HYDROcodone-acetaminophen 5-325 MG tablet Commonly known as:  NORCO/VICODIN Take 1 tablet by mouth every 6 (six) hours as needed for moderate pain. What changed:  Another medication with the same name was added. Make sure you understand how and when to take each.   HYDROcodone-acetaminophen 5-325 MG tablet Commonly known as:  NORCO Take 1 tablet by mouth every 6 (six) hours as needed for moderate pain. What changed:  You were already taking a medication with the same name, and this prescription was added. Make sure you understand how and when to take each.   Ibuprofen 200 MG Caps Take 200 mg by mouth every 6 (six) hours as needed (pain).   multivitamin with minerals Tabs tablet Take 1 tablet by mouth daily.   simvastatin 20 MG tablet Commonly known as:  ZOCOR Take 20 mg by mouth daily.   trolamine salicylate 10 % cream Commonly known as:  ASPERCREME Apply 1 application topically daily as needed for muscle pain.   valsartan-hydrochlorothiazide 160-12.5 MG tablet Commonly known as:  DIOVAN-HCT Take 2 tablets by mouth daily.       Diagnostic Studies: Dg Shoulder Right Port  Result Date: 09/27/2016 CLINICAL DATA:  Status post right shoulder replacement. EXAM: PORTABLE RIGHT SHOULDER COMPARISON:  Radiographs of February 09, 2010. FINDINGS: Status post right shoulder arthroplasty. Glenoid and humeral components appear to be well situated. No dislocation is noted. Probable surgical resection of distal right clavicle is noted. IMPRESSION: Right total shoulder arthroplasty appears to be well situated. Electronically Signed   By: Lupita RaiderJames  Green Jr, M.D.   On: 09/27/2016 13:39    Disposition: 01-Home or Self Care    Follow-up Information    NORRIS,STEVEN R, MD. Call in 2 weeks.   Specialty:  Orthopedic Surgery Why:  [phone] Contact information: [address] [place] [address] [phone]            Signed: Thea GistDIXON,Nayelly Laughman B 10/01/2016, 10:58 AM

## 2018-03-27 IMAGING — DX DG SHOULDER 2+V PORT*R*
2 series · 2 of 2 positions shown · non-contrast
Comparison: Radiographs February 09, 2010.

CLINICAL DATA: Status post right shoulder replacement.

EXAM:
PORTABLE RIGHT SHOULDER

[shoulder ap]
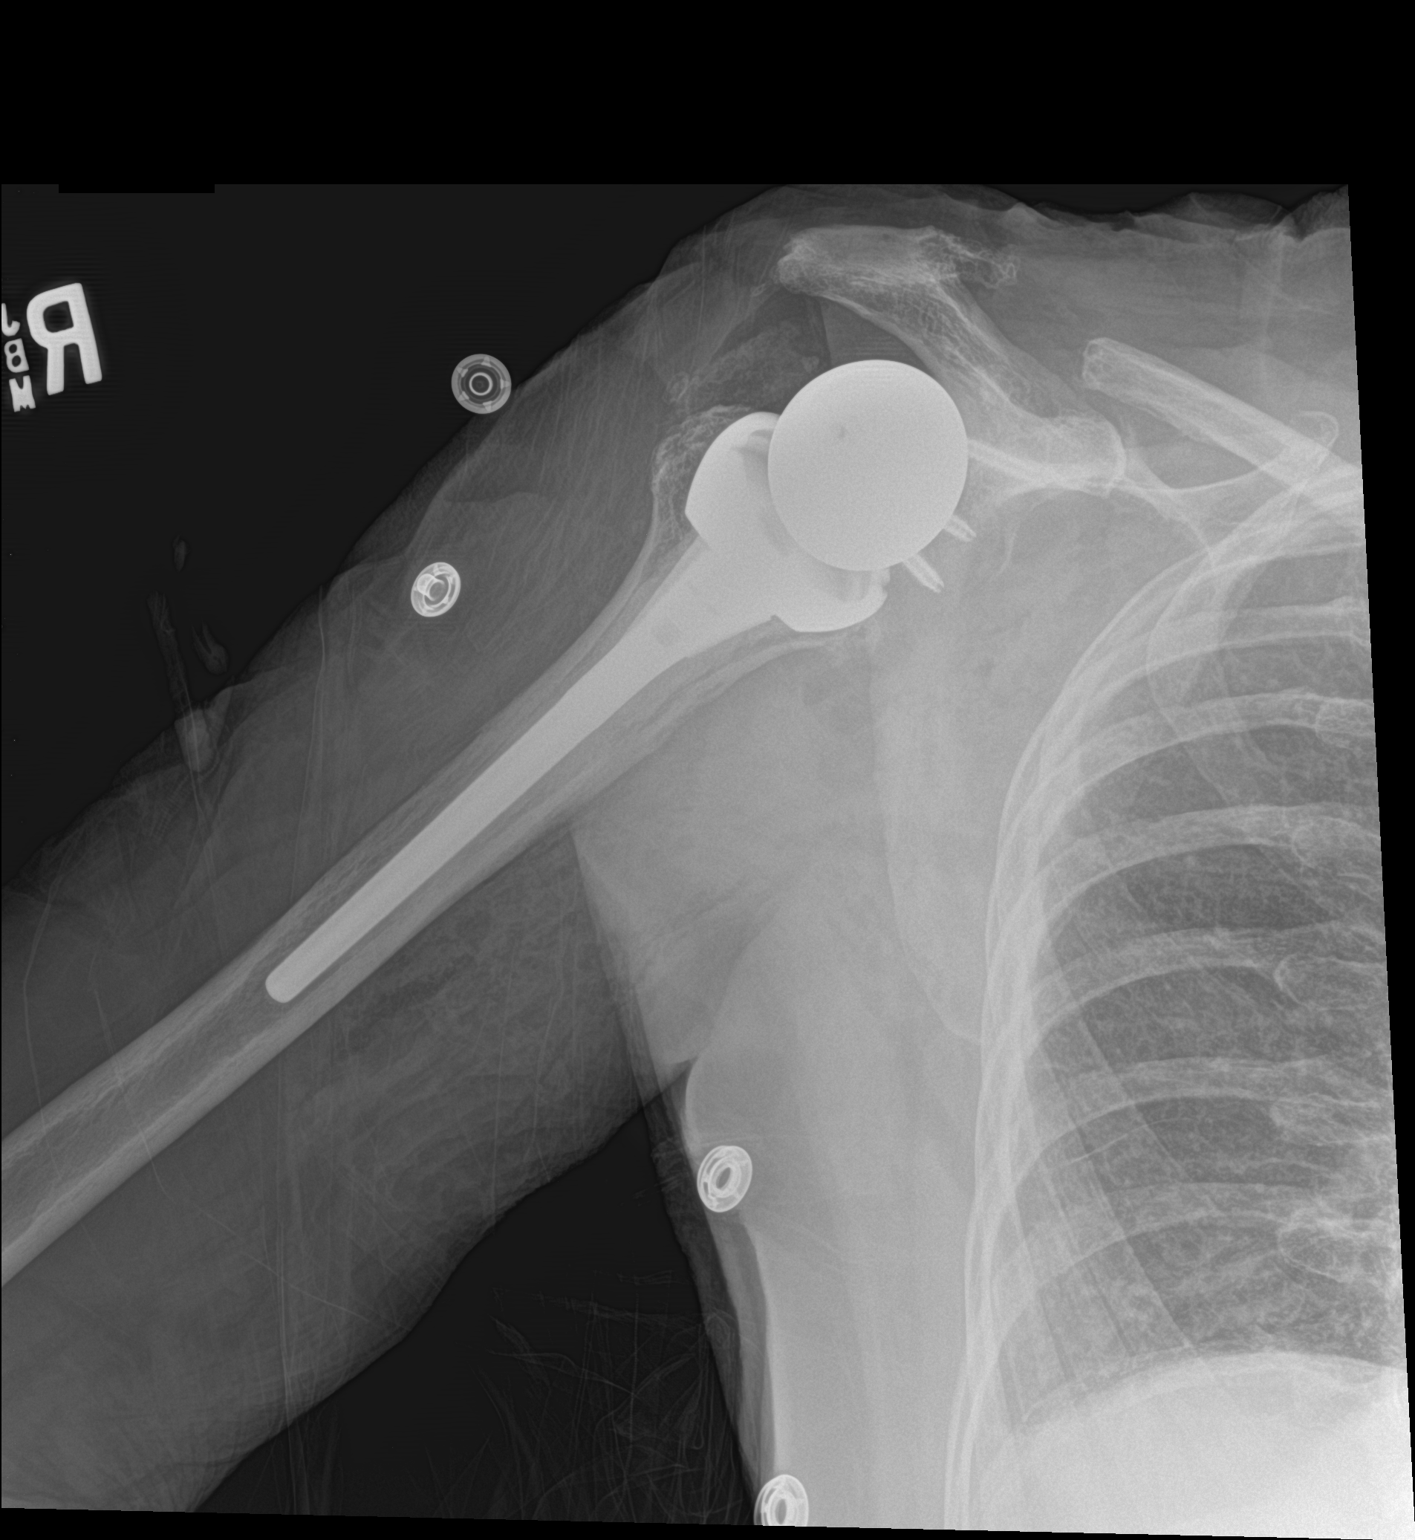

[shoulder y view]
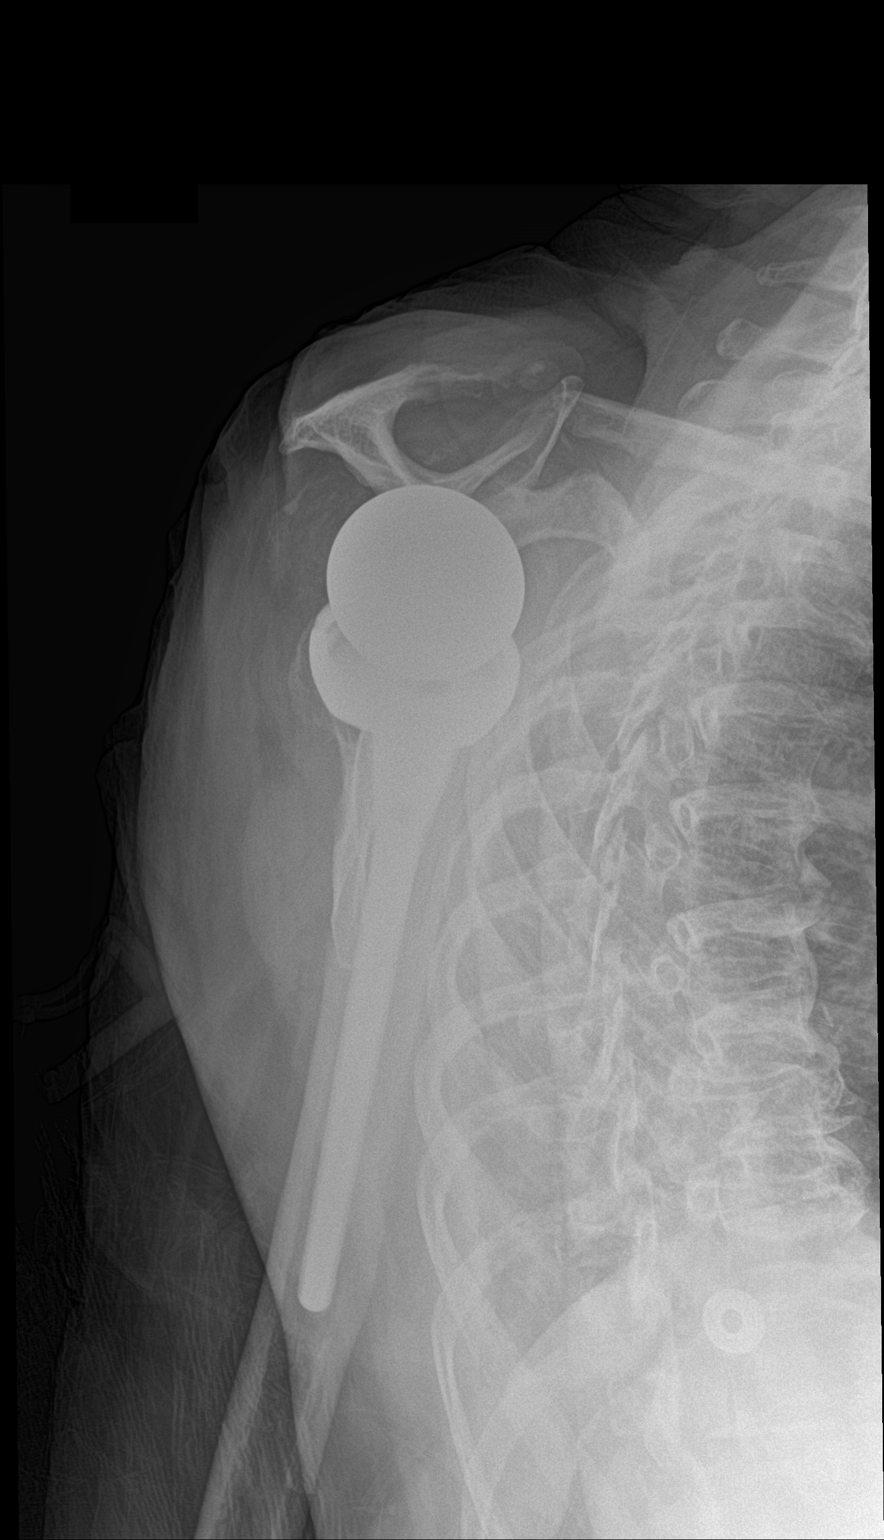

[2 of 2 positions shown; findings below may reference images not displayed]

FINDINGS: Status post right shoulder arthroplasty. Glenoid and humeral
components appear to be well situated. No dislocation is noted.
Probable surgical resection of distal right clavicle is noted.
IMPRESSION: Right total shoulder arthroplasty appears to be well situated.

## 2020-08-12 ENCOUNTER — Ambulatory Visit: Payer: Medicare Other | Attending: Internal Medicine

## 2020-08-12 DIAGNOSIS — Z23 Encounter for immunization: Secondary | ICD-10-CM

## 2020-08-12 NOTE — Progress Notes (Signed)
° °  Covid-19 Vaccination Clinic  Name:  [name]    MRN: [id number] DOB: 10/17/1927  08/12/2020  [name] was observed post Covid-19 immunization for 15 minutes without incident. She was provided with Vaccine Information Sheet and instruction to access the V-Safe system.   [name] was instructed to call 911 with any severe reactions post vaccine:  Difficulty breathing   Swelling of face and throat   A fast heartbeat   A bad rash all over body   Dizziness and weakness

## 2022-05-07 ENCOUNTER — Other Ambulatory Visit: Payer: Self-pay | Admitting: Orthopedic Surgery

## 2022-05-11 ENCOUNTER — Emergency Department (HOSPITAL_BASED_OUTPATIENT_CLINIC_OR_DEPARTMENT_OTHER): Payer: Medicare Other

## 2022-05-11 ENCOUNTER — Encounter (HOSPITAL_BASED_OUTPATIENT_CLINIC_OR_DEPARTMENT_OTHER): Payer: Self-pay | Admitting: Emergency Medicine

## 2022-05-11 ENCOUNTER — Emergency Department (HOSPITAL_BASED_OUTPATIENT_CLINIC_OR_DEPARTMENT_OTHER)
Admission: EM | Admit: 2022-05-11 | Discharge: 2022-05-11 | Disposition: A | Discharge status: Home / Self Care | Payer: Medicare Other | Attending: Emergency Medicine | Admitting: Emergency Medicine

## 2022-05-11 DIAGNOSIS — W01198A Fall on same level from slipping, tripping and stumbling with subsequent striking against other object, initial encounter: Secondary | ICD-10-CM | POA: Diagnosis not present

## 2022-05-11 DIAGNOSIS — Z7982 Long term (current) use of aspirin: Secondary | ICD-10-CM | POA: Insufficient documentation

## 2022-05-11 DIAGNOSIS — I10 Essential (primary) hypertension: Secondary | ICD-10-CM | POA: Insufficient documentation

## 2022-05-11 DIAGNOSIS — S0101XA Laceration without foreign body of scalp, initial encounter: Secondary | ICD-10-CM

## 2022-05-11 DIAGNOSIS — Z79899 Other long term (current) drug therapy: Secondary | ICD-10-CM | POA: Insufficient documentation

## 2022-05-11 DIAGNOSIS — S0990XA Unspecified injury of head, initial encounter: Secondary | ICD-10-CM | POA: Diagnosis present

## 2022-05-11 MED ORDER — LIDOCAINE HCL (PF) 1 % IJ SOLN
5.0000 mL | Freq: Once | INTRAMUSCULAR | Status: AC
  Administered 2022-05-11: 5 mL
  Filled 2022-05-11: qty 5

## 2022-05-11 NOTE — Discharge Instructions (Signed)
The sutures can come out in around 5 days or else they can be left in because they are absorbable.

## 2022-05-11 NOTE — ED Triage Notes (Signed)
Family reports pt fell when trying to pick up a key. Pt told family she lost her balance and got dizzy when bending over. Pt hit R shoulder/neck when she fell. Pt not on blood thinners. 0.5" laceration noted to R side of head. No bleeding at present.

## 2022-05-11 NOTE — ED Provider Notes (Signed)
[place] EMERGENCY DEPARTMENT Provider Note   CSN: [id number] Arrival date & time: 05/11/22  1828     History  Chief Complaint  Patient presents with   Fall   Head Laceration    [name] is a 88 y.o. female.   Fall  Head Laceration  Patient events with fall and head laceration.  Reportedly was bending over to get a key lost balance and fell.  Laceration to right parietal area.  Discussed with patient and family number.  The fall is not unusual andto her unsteadiness at baseline.  Has been using her nondominant hand for her walker because she had surgery on the right hand.  At her baseline now and feels good.  No neck pain.  No shoulder pain.    Past Medical History:  Diagnosis Date   Arthritis    GERD (gastroesophageal reflux disease)    occ tums   Hyperlipemia    Hypertension     Home Medications Prior to Admission medications   Medication Sig Start Date End Date Taking? Authorizing Provider  amLODipine (NORVASC) 5 MG tablet Take 5 mg by mouth daily.   Yes [provider]  aspirin EC 81 MG tablet Take 81 mg by mouth daily.   Yes [provider]  cephALEXin (KEFLEX) 500 MG capsule Take 500 mg by mouth 4 (four) times daily.   Yes [provider]  HYDROcodone-acetaminophen (NORCO/VICODIN) 5-325 MG per tablet Take 1 tablet by mouth every 6 (six) hours as needed for moderate pain. 03/19/14  Yes Milton Ferguson, MD  Multiple Vitamin (MULTIVITAMIN WITH MINERALS) TABS tablet Take 1 tablet by mouth daily.   Yes [provider]  simvastatin (ZOCOR) 20 MG tablet Take 20 mg by mouth daily.   Yes [provider]  valsartan-hydrochlorothiazide (DIOVAN-HCT) 160-12.5 MG per tablet Take 2 tablets by mouth daily.    Yes [provider]  cholecalciferol (VITAMIN D) 1000 units tablet Take 5,000 Units by mouth once a week.    [provider]  HYDROcodone-acetaminophen (NORCO) 5-325 MG tablet Take 1 tablet by  mouth every 6 (six) hours as needed for moderate pain. 09/27/16   Netta Cedars, MD  Ibuprofen 200 MG CAPS Take 200 mg by mouth every 6 (six) hours as needed (pain).    [provider]  trolamine salicylate (ASPERCREME) 10 % cream Apply 1 application topically daily as needed for muscle pain.    [provider]      Allergies    Codeine    Review of Systems   Review of Systems  Physical Exam Updated Vital Signs BP (!) 176/83   Pulse 72   Temp 98.3 F (36.8 C) (Oral)   Resp 18   SpO2 100%  Physical Exam Vitals and nursing note reviewed.  HENT:     Head:     Comments: 5 mm right parietal laceration Eyes:     Pupils: Pupils are equal, round, and reactive to light.  Abdominal:     Tenderness: There is no abdominal tenderness.  Musculoskeletal:        General: No tenderness.     Cervical back: Neck supple. No tenderness.  Skin:    General: Skin is warm.     Capillary Refill: Capillary refill takes less than 2 seconds.  Neurological:     Mental Status: She is alert and oriented to person, place, and time. Mental status is at baseline.     ED Results / Procedures / Treatments  Labs (all labs ordered are listed, but only abnormal results are displayed) Labs Reviewed - No data to display  EKG None  Radiology CT Cervical Spine Wo Contrast  Result Date: 05/11/2022 CLINICAL DATA:  Neck trauma (Age >= 65y) Bent over to pick up a key, dizziness leading to fall. EXAM: CT CERVICAL SPINE WITHOUT CONTRAST TECHNIQUE: Multidetector CT imaging of the cervical spine was performed without intravenous contrast. Multiplanar CT image reconstructions were also generated. RADIATION DOSE REDUCTION: This exam was performed according to the departmental dose-optimization program which includes automated exposure control, adjustment of the mA and/or kV according to patient size and/or use of iterative reconstruction technique. COMPARISON:  None Available. FINDINGS: Alignment: No  traumatic subluxation. There is grade 1 stepwise anterolisthesis of C3 on C4, C4 on C5, C5 on C6 and C7 on T1. Skull base and vertebrae: No acute fracture. Vertebral body heights are maintained. The dens and skull base are intact. Moderate C1-C2 arthropathy with pannus. Soft tissues and spinal canal: No prevertebral fluid or swelling. No visible canal hematoma. Disc levels: Diffuse degenerative disc disease from C3-C4 through C7-T1. Moderate diffuse facet hypertrophy. Upper chest: Chronic lung disease at the apices with left pleuroparenchymal scarring. Other: None. IMPRESSION: 1. No acute fracture or traumatic subluxation of the cervical spine. 2. Multilevel degenerative disc disease and facet hypertrophy. Electronically Signed   By: Narda Rutherford M.D.   On: 05/11/2022 20:14   DG Shoulder Right  Result Date: 05/11/2022 CLINICAL DATA:  Pain after fall EXAM: RIGHT SHOULDER - 2+ VIEW COMPARISON:  None Available. FINDINGS: The patient is status post shoulder replacement. No fracture or dislocation identified. IMPRESSION: Negative. Electronically Signed   By: Gerome Sam III M.D.   On: 05/11/2022 20:10   CT Head Wo Contrast  Result Date: 05/11/2022 CLINICAL DATA:  Head trauma, moderate-severe Dizziness when bending over to pick up a key leading to fall. EXAM: CT HEAD WITHOUT CONTRAST TECHNIQUE: Contiguous axial images were obtained from the base of the skull through the vertex without intravenous contrast. RADIATION DOSE REDUCTION: This exam was performed according to the departmental dose-optimization program which includes automated exposure control, adjustment of the mA and/or kV according to patient size and/or use of iterative reconstruction technique. COMPARISON:  None Available. FINDINGS: Brain: Normal for age atrophy. No intracranial hemorrhage, mass effect, or midline shift. No hydrocephalus. The basilar cisterns are patent. Normal for age periventricular chronic small vessel ischemic change. No  evidence of territorial infarct or acute ischemia. No extra-axial or intracranial fluid collection. Vascular: Atherosclerosis of skullbase vasculature without hyperdense vessel or abnormal calcification. Skull: No fracture or focal lesion. Sinuses/Orbits: Trace fluid in right side of sphenoid sinus. No skull base fracture remaining paranasal sinuses are clear. No mastoid effusion. Bilateral cataract resection. Other: No confluent scalp hematoma. IMPRESSION: 1. No acute intracranial abnormality. No skull fracture. 2. Normal for age atrophy and chronic small vessel ischemic change. Electronically Signed   By: Narda Rutherford M.D.   On: 05/11/2022 20:10    Procedures .Marland KitchenLaceration Repair  Date/Time: 05/11/2022 9:00 PM  Performed by: Benjiman Core, MD Authorized by: Benjiman Core, MD   Consent:    Consent obtained:  Verbal   Consent given by:  Patient   Risks, benefits, and alternatives were discussed: yes     Risks discussed:  Infection, need for additional repair, nerve damage and pain   Alternatives discussed:  No treatment Universal protocol:    Patient identity confirmed:  Verbally with patient Anesthesia:    Anesthesia method:  Local infiltration   Local anesthetic:  Lidocaine 1% w/o epi Laceration details:    Location:  Scalp   Scalp location:  R parietal   Length (cm):  0.5 Pre-procedure details:    Preparation:  Patient was prepped and draped in usual sterile fashion and imaging obtained to evaluate for foreign bodies Exploration:    Limited defect created (wound extended): no     Contaminated: no   Treatment:    Area cleansed with:  Shur-Clens   Amount of cleaning:  Standard Skin repair:    Repair method:  Sutures   Suture size:  4-0   Wound skin closure material used: vicryl rapide.   Suture technique:  Simple interrupted   Number of sutures:  3 Repair type:    Repair type:  Simple Post-procedure details:    Dressing:  Open (no dressing)   Procedure completion:   Tolerated well, no immediate complications     Medications Ordered in ED Medications  lidocaine (PF) (XYLOCAINE) 1 % injection 5 mL (5 mLs Infiltration Given 05/11/22 2132)    ED Course/ Medical Decision Making/ A&P                           Medical Decision Making Risk Prescription drug management.   Patient with fall.  Unsteady at her baseline.  Has scalp laceration that was closed by me.  No other apparent injury.  A shoulder reassuring.  Head CT and cervical spine CT reassuring.  No chest or abdominal tenderness.  Appears stable for discharge.  Absorbable sutures used to help with follow-up if unable to get removed        Final Clinical Impression(s) / ED Diagnoses Final diagnoses:  Laceration of scalp without foreign body, initial encounter    Rx / DC Orders ED Discharge Orders     None         Benjiman Core, MD 05/11/22 2255

## 2022-10-04 ENCOUNTER — Other Ambulatory Visit: Payer: Self-pay | Admitting: Sports Medicine

## 2022-10-04 DIAGNOSIS — M25551 Pain in right hip: Secondary | ICD-10-CM

## 2022-11-08 ENCOUNTER — Ambulatory Visit
Admission: RE | Admit: 2022-11-08 | Discharge: 2022-11-08 | Disposition: A | Discharge status: Home / Self Care | Payer: Medicare Other | Source: Ambulatory Visit | Attending: Sports Medicine | Admitting: Sports Medicine

## 2022-11-08 DIAGNOSIS — M25551 Pain in right hip: Secondary | ICD-10-CM

## 2023-08-15 ENCOUNTER — Other Ambulatory Visit: Payer: Self-pay

## 2023-08-15 DIAGNOSIS — N183 Chronic kidney disease, stage 3 unspecified: Secondary | ICD-10-CM

## 2023-08-15 DIAGNOSIS — Z96611 Presence of right artificial shoulder joint: Secondary | ICD-10-CM

## 2023-08-18 ENCOUNTER — Inpatient Hospital Stay: Payer: Medicare Other | Attending: Internal Medicine

## 2023-08-18 ENCOUNTER — Inpatient Hospital Stay (HOSPITAL_BASED_OUTPATIENT_CLINIC_OR_DEPARTMENT_OTHER): Payer: Medicare Other | Admitting: Internal Medicine

## 2023-08-18 ENCOUNTER — Inpatient Hospital Stay: Payer: Medicare Other

## 2023-08-18 VITALS — BP 177/69 | HR 70 | Temp 97.5°F | Resp 15 | Ht 61.5 in | Wt 95.5 lb

## 2023-08-18 DIAGNOSIS — E785 Hyperlipidemia, unspecified: Secondary | ICD-10-CM

## 2023-08-18 DIAGNOSIS — D539 Nutritional anemia, unspecified: Secondary | ICD-10-CM | POA: Diagnosis not present

## 2023-08-18 DIAGNOSIS — D649 Anemia, unspecified: Secondary | ICD-10-CM | POA: Diagnosis present

## 2023-08-18 DIAGNOSIS — M1990 Unspecified osteoarthritis, unspecified site: Secondary | ICD-10-CM

## 2023-08-18 DIAGNOSIS — I10 Essential (primary) hypertension: Secondary | ICD-10-CM

## 2023-08-18 DIAGNOSIS — K219 Gastro-esophageal reflux disease without esophagitis: Secondary | ICD-10-CM | POA: Diagnosis not present

## 2023-08-18 DIAGNOSIS — D631 Anemia in chronic kidney disease: Secondary | ICD-10-CM

## 2023-08-18 DIAGNOSIS — N183 Anemia in chronic kidney disease: Secondary | ICD-10-CM

## 2023-08-18 LAB — CBC WITH DIFFERENTIAL (CANCER CENTER ONLY)
Abs Immature Granulocytes: 0.01 10*3/uL (ref 0.00–0.07)
Basophils Absolute: 0 10*3/uL (ref 0.0–0.1)
Basophils Relative: 0 %
Eosinophils Absolute: 0.1 10*3/uL (ref 0.0–0.5)
Eosinophils Relative: 2 %
HCT: 32 % — ABNORMAL LOW (ref 36.0–46.0)
Hemoglobin: 10.4 g/dL — ABNORMAL LOW (ref 12.0–15.0)
Immature Granulocytes: 0 %
Lymphocytes Relative: 25 %
Lymphs Abs: 1.1 10*3/uL (ref 0.7–4.0)
MCH: 30.6 pg (ref 26.0–34.0)
MCHC: 32.5 g/dL (ref 30.0–36.0)
MCV: 94.1 fL (ref 80.0–100.0)
Monocytes Absolute: 0.4 10*3/uL (ref 0.1–1.0)
Monocytes Relative: 8 %
Neutro Abs: 2.9 10*3/uL (ref 1.7–7.7)
Neutrophils Relative %: 65 %
Platelet Count: 212 10*3/uL (ref 150–400)
RBC: 3.4 MIL/uL — ABNORMAL LOW (ref 3.87–5.11)
RDW: 14.2 % (ref 11.5–15.5)
WBC Count: 4.5 10*3/uL (ref 4.0–10.5)
nRBC: 0 % (ref 0.0–0.2)

## 2023-08-18 LAB — IRON AND IRON BINDING CAPACITY (CC-WL,HP ONLY)
Iron: 61 ug/dL (ref 28–170)
Saturation Ratios: 22 % (ref 10.4–31.8)
TIBC: 277 ug/dL (ref 250–450)
UIBC: 216 ug/dL (ref 148–442)

## 2023-08-18 LAB — FERRITIN: Ferritin: 190 ng/mL (ref 11–307)

## 2023-08-18 LAB — CMP (CANCER CENTER ONLY)
ALT: 11 U/L (ref 0–44)
AST: 20 U/L (ref 15–41)
Albumin: 4.1 g/dL (ref 3.5–5.0)
Alkaline Phosphatase: 56 U/L (ref 38–126)
Anion gap: 7 (ref 5–15)
BUN: 42 mg/dL — ABNORMAL HIGH (ref 8–23)
CO2: 24 mmol/L (ref 22–32)
Calcium: 9.1 mg/dL (ref 8.9–10.3)
Chloride: 104 mmol/L (ref 98–111)
Creatinine: 1.62 mg/dL — ABNORMAL HIGH (ref 0.44–1.00)
GFR, Estimated: 29 mL/min — ABNORMAL LOW (ref >60)
Glucose, Bld: 105 mg/dL — ABNORMAL HIGH (ref 70–99)
Potassium: 5 mmol/L (ref 3.5–5.1)
Sodium: 135 mmol/L (ref 135–145)
Total Bilirubin: 0.4 mg/dL (ref 0.3–1.2)
Total Protein: 6.7 g/dL (ref 6.5–8.1)

## 2023-08-18 LAB — LACTATE DEHYDROGENASE: LDH: 114 U/L (ref 98–192)

## 2023-08-18 LAB — FOLATE: Folate: 34.4 ng/mL (ref >5.9)

## 2023-08-18 LAB — VITAMIN B12: Vitamin B-12: 458 pg/mL (ref 180–914)

## 2023-08-18 MED ORDER — INTEGRA PLUS PO CAPS: 1.0000 | ORAL_CAPSULE | Freq: Every day | ORAL | 3 refills | Status: DC

## 2023-08-18 NOTE — Progress Notes (Signed)
[facility] Telephone:(336) [phone]   Fax:([phone]  CONSULT NOTE  REFERRING PHYSICIAN: Dr. Duane Lope  REASON FOR CONSULTATION:  89 years old white female with persistent anemia.  HPI [name] is a 89 y.o. female with past medical history significant for osteoarthritis, GERD, hypertension, dyslipidemia as well as history of anemia.  The patient was seen by her primary care provider Dr. Tenny Craw on 07/09/2023 and blood work at that time including CBC showed low hemoglobin of 9.8 and hematocrit 30.1.  She has normal MCV of 93.4.  She has normal white blood count of 5.5 and normal platelets count of 295K.  Comprehensive metabolic panel at that time was remarkable for elevated serum creatinine of 1.55.  Also had elevated TSH of 6.59.  Had a stool for Hemoccult that was negative. Discussed the use of AI scribe software for clinical note transcription with the patient, who gave verbal consent to proceed.  History of Present Illness   The patient, a 89 year old individual with a history of hypertension, hyperlipidemia, arthritis, and acid reflux, presents for evaluation of long-standing anemia. The patient's primary care provider referred her due to a recent hemoglobin level of 9.8, which was discovered during routine blood work. The patient reports feeling tired, but denies experiencing any dizzy spells. She has been taking vitamin D and C supplements, but no iron or B12 supplements. She previously took iron supplements but discontinued them after being told her levels were satisfactory.  The patient has a history of anemia dating back several years. In 2009, her hemoglobin was 11.8, and by August of the same year, it had dropped to 8.6. In December 2017, her hemoglobin was 9.2. The patient denies any easy bruising or bleeding.  The patient has experienced weight loss since the death of her son eight years ago. She has no history of diabetes, heart attacks, or strokes. Her  family history is significant for lung cancer in her mother, heart disease in her father, and heart problems in both brothers. One sister had dementia, and the other died a year ago from an unknown cause. The patient's husband and son both died from lung cancer.  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 She denies any history of smoking, alcohol, or drug abuse. The patient's kidney function is slightly elevated, which could be contributing to her anemia.      HPI  Past Medical History:  Diagnosis Date   Arthritis    GERD (gastroesophageal reflux disease)    occ tums   Hyperlipemia    Hypertension     Past Surgical History:  Procedure Laterality Date   BACK SURGERY     CARPAL TUNNEL RELEASE Bilateral    HAND SURGERY Left    laceration artery,?tendon   HIP SURGERY Bilateral 04/2005   replacement   REVERSE SHOULDER ARTHROPLASTY Right 09/27/2016   Procedure: RIGHT REVERSE SHOULDER ARTHROPLASTY;  Surgeon: Beverely Low, MD;  Location: [facility] OR;  Service: Orthopedics;  Laterality: Right;   SHOULDER SURGERY Bilateral    rotator cuff repairs  several    No family history on file.  Social History Social History   Tobacco Use   Smoking status: Never   Smokeless tobacco: Never  Substance Use Topics   Alcohol use: No   Drug use: No    Allergies  Allergen Reactions   Codeine Other (See Comments)    Insomnia     Current Outpatient Medications  Medication Sig Dispense Refill   amLODipine (NORVASC) 5 MG tablet Take 5 mg by mouth daily.     aspirin EC 81 MG tablet Take 81 mg by mouth daily.     cephALEXin (KEFLEX) 500 MG capsule Take 500 mg by mouth 4 (four) times daily.     cholecalciferol (VITAMIN D) 1000 units tablet Take 5,000 Units by mouth once a week.     HYDROcodone-acetaminophen (NORCO) 5-325 MG tablet Take 1 tablet by mouth every 6 (six) hours as needed for moderate pain. 30 tablet 0   HYDROcodone-acetaminophen  (NORCO/VICODIN) 5-325 MG per tablet Take 1 tablet by mouth every 6 (six) hours as needed for moderate pain. 30 tablet 0   Ibuprofen 200 MG CAPS Take 200 mg by mouth every 6 (six) hours as needed (pain).     Multiple Vitamin (MULTIVITAMIN WITH MINERALS) TABS tablet Take 1 tablet by mouth daily.     simvastatin (ZOCOR) 20 MG tablet Take 20 mg by mouth daily.     trolamine salicylate (ASPERCREME) 10 % cream Apply 1 application topically daily as needed for muscle pain.     valsartan-hydrochlorothiazide (DIOVAN-HCT) 160-12.5 MG per tablet Take 2 tablets by mouth daily.      No current facility-administered medications for this visit.    Review of Systems  Constitutional: positive for fatigue and weight loss Eyes: negative Ears, nose, mouth, throat, and face: negative Respiratory: negative Cardiovascular: negative Gastrointestinal: negative Genitourinary:negative Integument/breast: negative Hematologic/lymphatic: negative Musculoskeletal:negative Neurological: negative Behavioral/Psych: negative Endocrine: negative Allergic/Immunologic: negative  Physical Exam  LKG:MWNUU, healthy, no distress, well developed, and malnourished SKIN: skin color, texture, turgor are normal HEAD: Normocephalic EYES: normal, PERRLA, Conjunctiva are pink and non-injected EARS: External ears normal, Canals clear OROPHARYNX:no exudate, no erythema, and lips, buccal mucosa, and tongue normal  NECK: supple, no adenopathy, no JVD LYMPH:  no palpable lymphadenopathy, no hepatosplenomegaly BREAST:not examined LUNGS: clear to auscultation , and palpation HEART: regular rate & rhythm, no murmurs, and no gallops ABDOMEN:abdomen soft, non-tender, normal bowel sounds, and no masses or organomegaly BACK: Back symmetric, no curvature., No CVA tenderness EXTREMITIES:no joint deformities, effusion, or inflammation, no edema  NEURO: alert & oriented x 3 with fluent speech, no focal motor/sensory  deficits  PERFORMANCE STATUS: ECOG 1-2  LABORATORY DATA: Lab Results  Component Value Date   WBC 7.0 09/24/2016   HGB 9.2 (L) 09/28/2016   HCT 28.3 (L) 09/28/2016   MCV 90.5 09/24/2016   PLT 285 09/24/2016      Chemistry      Component Value Date/Time   NA 133 (L) 09/28/2016 0554   K 3.7 09/28/2016 0554   CL 101 09/28/2016 0554   CO2 21 (L) 09/28/2016 0554   BUN 20 09/28/2016 0554   CREATININE 1.18 (H) 09/28/2016 0554      Component Value Date/Time   CALCIUM 8.2 (L) 09/28/2016 0554       RADIOGRAPHIC STUDIES: No results found.  ASSESSMENT AND PLAN: This is a very pleasant 89 years old white female presented for evaluation of persistent anemia of unclear etiology.    Chronic Anemia Longstanding anemia with recent hemoglobin of 9.8. Negative stool guaiac test. Possible anemia of chronic disease given slightly elevated kidney function. Fatigue reported, but no dizziness. -Order additional blood work including iron study, ferritin, serum folate, serum vitamin B12, and serum protein electrophoresis to further evaluate cause of anemia. -Start iron supplements, with caution given history of acid reflux and potential for constipation. -Return for follow-up in 1 month to  assess response to iron supplementation.  Hypertension and Hyperlipidemia Chronic conditions managed by primary care provider, Dr. Tenny Craw. No acute concerns discussed. -Continue current management under Dr. Tenny Craw.  Acid Reflux Chronic condition, no acute concerns discussed. -Continue current management. Monitor for potential exacerbation with initiation of iron supplements.  Arthritis Chronic condition, no acute concerns discussed. -Continue current management.  Post Back Surgery No acute concerns discussed. -Continue current management.   The patient was advised to call immediately if she had any concerning symptoms in the interval. The patient voices understanding of current disease status and treatment  options and is in agreement with the current care plan.  All questions were answered. The patient knows to call the clinic with any problems, questions or concerns. We can certainly see the patient much sooner if necessary.  Thank you so much for allowing me to participate in the care of [name]. I will continue to follow up the patient with you and assist in her care.  The total time spent in the appointment was 60 minutes.  Disclaimer: This note was dictated with voice recognition software. Similar sounding words can inadvertently be transcribed and may not be corrected upon review.   Lajuana Matte August 18, 2023, 12:02 PM

## 2023-08-20 LAB — PROTEIN ELECTROPHORESIS, SERUM, WITH REFLEX
A/G Ratio: 1.4 (ref 0.7–1.7)
Albumin ELP: 3.7 g/dL (ref 2.9–4.4)
Alpha-1-Globulin: 0.2 g/dL (ref 0.0–0.4)
Alpha-2-Globulin: 0.8 g/dL (ref 0.4–1.0)
Beta Globulin: 0.8 g/dL (ref 0.7–1.3)
Gamma Globulin: 0.8 g/dL (ref 0.4–1.8)
Globulin, Total: 2.6 g/dL (ref 2.2–3.9)
Total Protein ELP: 6.3 g/dL (ref 6.0–8.5)

## 2023-09-18 ENCOUNTER — Inpatient Hospital Stay: Payer: Medicare Other | Admitting: Internal Medicine

## 2023-09-18 ENCOUNTER — Inpatient Hospital Stay: Payer: Medicare Other | Attending: Internal Medicine

## 2023-09-18 VITALS — BP 187/61 | HR 65 | Temp 98.2°F | Resp 15 | Ht 61.5 in | Wt 97.8 lb

## 2023-09-18 DIAGNOSIS — D649 Anemia, unspecified: Secondary | ICD-10-CM | POA: Diagnosis present

## 2023-09-18 DIAGNOSIS — D539 Nutritional anemia, unspecified: Secondary | ICD-10-CM

## 2023-09-18 LAB — CBC WITH DIFFERENTIAL (CANCER CENTER ONLY)
Abs Immature Granulocytes: 0.03 10*3/uL (ref 0.00–0.07)
Basophils Absolute: 0 10*3/uL (ref 0.0–0.1)
Basophils Relative: 0 %
Eosinophils Absolute: 0.1 10*3/uL (ref 0.0–0.5)
Eosinophils Relative: 2 %
HCT: 32.3 % — ABNORMAL LOW (ref 36.0–46.0)
Hemoglobin: 10.2 g/dL — ABNORMAL LOW (ref 12.0–15.0)
Immature Granulocytes: 1 %
Lymphocytes Relative: 27 %
Lymphs Abs: 1.3 10*3/uL (ref 0.7–4.0)
MCH: 30.3 pg (ref 26.0–34.0)
MCHC: 31.6 g/dL (ref 30.0–36.0)
MCV: 95.8 fL (ref 80.0–100.0)
Monocytes Absolute: 0.4 10*3/uL (ref 0.1–1.0)
Monocytes Relative: 8 %
Neutro Abs: 2.9 10*3/uL (ref 1.7–7.7)
Neutrophils Relative %: 62 %
Platelet Count: 269 10*3/uL (ref 150–400)
RBC: 3.37 MIL/uL — ABNORMAL LOW (ref 3.87–5.11)
RDW: 14.4 % (ref 11.5–15.5)
WBC Count: 4.7 10*3/uL (ref 4.0–10.5)
nRBC: 0 % (ref 0.0–0.2)

## 2023-09-18 NOTE — Progress Notes (Signed)
[facility] [phone]   Fax:([phone]  OFFICE PROGRESS NOTE  Daisy Floro, MD [address] [place] [address]  DIAGNOSIS: Persistent anemia of unclear etiology.  No evidence for nutritional deficiency.  PRIOR THERAPY: None  CURRENT THERAPY: Over-the-counter oral iron supplements with orange juice.  INTERVAL HISTORY: [name] 89 y.o. female returns to the clinic today for follow-up visit accompanied by a family member.Discussed the use of AI scribe software for clinical note transcription with the patient, who gave verbal consent to proceed.  History of Present Illness   The patient, a 89 year old with a history of anemia, reports no changes in her health status since the last visit a month ago. She denies experiencing any new symptoms such as fatigue, weakness, or dizziness. She also denies any presence of blood in her stool. Despite the persistent anemia, the patient maintains her daily activities without any significant difficulties. The patient's hemoglobin level remains on the lower side, currently at 10.2, but has been stable at this level for some time. The patient's diet includes foods rich in iron, such as liver, which she enjoys.       MEDICAL HISTORY: Past Medical History:  Diagnosis Date   Arthritis    GERD (gastroesophageal reflux disease)    occ tums   Hyperlipemia    Hypertension     ALLERGIES:  is allergic to codeine.  MEDICATIONS:  Current Outpatient Medications  Medication Sig Dispense Refill   amLODipine (NORVASC) 5 MG tablet Take 5 mg by mouth daily.     aspirin EC 81 MG tablet Take 81 mg by mouth daily.     cephALEXin (KEFLEX) 500 MG capsule Take 500 mg by mouth 4 (four) times daily.     cholecalciferol (VITAMIN D) 1000 units tablet Take 5,000 Units by mouth once a week.     FeFum-FePoly-FA-B Cmp-C-Biot (INTEGRA PLUS) CAPS Take 1 capsule by mouth daily. 30 capsule 3   HYDROcodone-acetaminophen  (NORCO) 5-325 MG tablet Take 1 tablet by mouth every 6 (six) hours as needed for moderate pain. 30 tablet 0   HYDROcodone-acetaminophen (NORCO/VICODIN) 5-325 MG per tablet Take 1 tablet by mouth every 6 (six) hours as needed for moderate pain. 30 tablet 0   Ibuprofen 200 MG CAPS Take 200 mg by mouth every 6 (six) hours as needed (pain).     Multiple Vitamin (MULTIVITAMIN WITH MINERALS) TABS tablet Take 1 tablet by mouth daily.     simvastatin (ZOCOR) 20 MG tablet Take 20 mg by mouth daily.     trolamine salicylate (ASPERCREME) 10 % cream Apply 1 application topically daily as needed for muscle pain.     valsartan-hydrochlorothiazide (DIOVAN-HCT) 160-12.5 MG per tablet Take 2 tablets by mouth daily.      No current facility-administered medications for this visit.    SURGICAL HISTORY:  Past Surgical History:  Procedure Laterality Date   BACK SURGERY     CARPAL TUNNEL RELEASE Bilateral    HAND SURGERY Left    laceration artery,?tendon   HIP SURGERY Bilateral 04/2005   replacement   REVERSE SHOULDER ARTHROPLASTY Right 09/27/2016   Procedure: RIGHT REVERSE SHOULDER ARTHROPLASTY;  Surgeon: Beverely Low, MD;  Location: [facility] OR;  Service: Orthopedics;  Laterality: Right;   SHOULDER SURGERY Bilateral    rotator cuff repairs  several    REVIEW OF SYSTEMS:  A comprehensive review of systems was negative.   PHYSICAL EXAMINATION: General appearance: alert, cooperative, and no distress Head:  Normocephalic, without obvious abnormality, atraumatic Neck: no adenopathy, no JVD, supple, symmetrical, trachea midline, and thyroid not enlarged, symmetric, no tenderness/mass/nodules Lymph nodes: Cervical, supraclavicular, and axillary nodes normal. Resp: clear to auscultation bilaterally Back: symmetric, no curvature. ROM normal. No CVA tenderness. Cardio: regular rate and rhythm, S1, S2 normal, no murmur, click, rub or gallop GI: soft, non-tender; bowel sounds normal; no masses,  no  organomegaly Extremities: extremities normal, atraumatic, no cyanosis or edema  ECOG PERFORMANCE STATUS: 1 - Symptomatic but completely ambulatory  Blood pressure (!) 187/61, pulse 65, temperature 98.2 F (36.8 C), temperature source Temporal, resp. rate 15, height 5' 1.5" (1.562 m), weight 97 lb 12.8 oz (44.4 kg), SpO2 100%.  LABORATORY DATA: Lab Results  Component Value Date   WBC 4.7 09/18/2023   HGB 10.2 (L) 09/18/2023   HCT 32.3 (L) 09/18/2023   MCV 95.8 09/18/2023   PLT 269 09/18/2023      Chemistry      Component Value Date/Time   NA 135 08/18/2023 1152   K 5.0 08/18/2023 1152   CL 104 08/18/2023 1152   CO2 24 08/18/2023 1152   BUN 42 (H) 08/18/2023 1152   CREATININE 1.62 (H) 08/18/2023 1152      Component Value Date/Time   CALCIUM 9.1 08/18/2023 1152   ALKPHOS 56 08/18/2023 1152   AST 20 08/18/2023 1152   ALT 11 08/18/2023 1152   BILITOT 0.4 08/18/2023 1152       RADIOGRAPHIC STUDIES: No results found.  ASSESSMENT AND PLAN:     Anemia Chronic mild anemia with hemoglobin at 10.2 g/dL. Extensive blood work including iron study, ferritin, vitamin B12, folate, and serum protein electrophoresis were normal. No evidence of blood loss or other obvious causes. Likely anemia of chronic disease given age. Bone marrow biopsy considered but not pursued due to age and limited potential for actionable findings. Patient is asymptomatic with no fatigue, weakness, or dizziness. Decision made to monitor rather than pursue invasive diagnostics. - Monitor hemoglobin levels - Advise dietary intake of iron-rich foods (red meat, liver, spinach) - Follow-up with family doctor, Dr. Tenny Craw - Schedule follow-up visit as needed.   The patient was advised to call immediately if she has any concerning symptoms The patient voices understanding of current disease status and treatment options and is in agreement with the current care plan.  All questions were answered. The patient knows to  call the clinic with any problems, questions or concerns. We can certainly see the patient much sooner if necessary.  The total time spent in the appointment was 20 minutes.  Disclaimer: This note was dictated with voice recognition software. Similar sounding words can inadvertently be transcribed and may not be corrected upon review.

## 2024-02-03 ENCOUNTER — Ambulatory Visit (INDEPENDENT_AMBULATORY_CARE_PROVIDER_SITE_OTHER): Payer: Medicare Other | Admitting: Otolaryngology

## 2024-02-03 ENCOUNTER — Encounter (INDEPENDENT_AMBULATORY_CARE_PROVIDER_SITE_OTHER): Payer: Self-pay

## 2024-02-03 VITALS — Ht 60.0 in | Wt 94.0 lb

## 2024-02-03 DIAGNOSIS — H6123 Impacted cerumen, bilateral: Secondary | ICD-10-CM | POA: Diagnosis not present

## 2024-02-03 NOTE — Progress Notes (Signed)
 Patient ID: [name], female   DOB: [birth year], 89 y.o.   MRN: [id number]  Follow-up: Recurrent cerumen impaction  Procedure: Bilateral cerumen disimpaction.   Indication: Cerumen impaction, resulting in ear discomfort and conductive hearing loss.   Description: The patient is placed supine on the operating table. Under the operating microscope, the right ear canal is examined and is noted to be impacted with cerumen. The cerumen is carefully removed with a combination of suction catheters, cerumen curette, and alligator forceps. After the cerumen removal, the ear canal and tympanic membrane are noted to be normal. No middle ear effusion is noted. The same procedure is then repeated on the left side without exception. The patient tolerated the procedure well.  Follow-up care:  The patient is instructed not to use Q-tips to clean the ear canals. The patient will follow up in 12 months.

## 2024-03-04 ENCOUNTER — Encounter: Payer: Self-pay | Admitting: Podiatry

## 2024-03-04 ENCOUNTER — Ambulatory Visit (INDEPENDENT_AMBULATORY_CARE_PROVIDER_SITE_OTHER): Payer: Self-pay | Admitting: Podiatry

## 2024-03-04 DIAGNOSIS — M79675 Pain in left toe(s): Secondary | ICD-10-CM | POA: Diagnosis not present

## 2024-03-04 DIAGNOSIS — B351 Tinea unguium: Secondary | ICD-10-CM

## 2024-03-04 DIAGNOSIS — M79674 Pain in right toe(s): Secondary | ICD-10-CM | POA: Diagnosis not present

## 2024-03-04 DIAGNOSIS — L97522 Non-pressure chronic ulcer of other part of left foot with fat layer exposed: Secondary | ICD-10-CM

## 2024-03-04 MED ORDER — MUPIROCIN 2 % EX OINT: 1.0000 | TOPICAL_OINTMENT | Freq: Two times a day (BID) | CUTANEOUS | 2 refills | Status: DC

## 2024-03-04 NOTE — Progress Notes (Signed)
  Subjective:  Patient ID: [name], female    DOB: January 27, 1927,  MRN: [id number]  Chief Complaint  Patient presents with   Wound Check    RFC and wound on left foot bunion. 7 pain. Wound cream to treat. Non diabetic.    89 y.o. female presents with the above complaint. History confirmed with patient. Patient presenting with pain related to dystrophic thickened elongated nails. Patient is unable to trim own nails related to nail dystrophy and/or mobility issues. She also has been dealing with ulceration to left 1st MPJ at area of medial eminence. Patient does not have a history of T2DM. Presenting with daughter.  Objective:  Physical Exam: warm, good capillary refill nail exam onychomycosis of the toenails, onycholysis, and dystrophic nails DP pulses palpable, PT pulses palpable, and protective sensation intact Left Foot:  Pain with palpation of nails due to elongation and dystrophic growth.  Focal area of ulceration left medial first MPJ with fat layer exposed 0.3 cm in diameter.  Area of mild inflammation about this area. Right Foot: Pain with palpation of nails due to elongation and dystrophic growth.   Assessment:   1. Foot ulcer with fat layer exposed, left (HCC)   2. Pain due to onychomycosis of toenails of both feet      Plan:  Patient was evaluated and treated and all questions answered.  # Left foot ulceration associated with bunion deformity, fat layer exposed - Mupirocin  sent into patient's pharmacy to be applied twice a day and covered with Band-Aid.  Antibacterial ointment and Band-Aid applied today. - Currently on course of oral Keflex. - Patient presents wearing other sneakers that appear tight.  Discussed appropriate fitting shoe gear,, using shoes with soft mesh upper and applying padding over the area - Gel bunion pad dispensed today. - Discussed signs and symptoms of worsening infection including worsening redness, swelling, drainage, or developing signs of  systemic illness.  Seek medical attention if these develop. - Follow-up in 2 weeks for reevaluation.  Has seen Dr. Meriam Stamp at his previous office.  #Onychomycosis with pain  -Nails palliatively debrided as below. -Educated on self-care  Procedure: Nail Debridement Rationale: Pain Type of Debridement: manual, sharp debridement. Instrumentation: Nail nipper, rotary burr. Number of Nails: 10  Return in about 2 weeks (around 03/18/2024) for Ulcer Check.         Eve Hinders, DPM [facility]

## 2024-03-23 ENCOUNTER — Ambulatory Visit (INDEPENDENT_AMBULATORY_CARE_PROVIDER_SITE_OTHER): Payer: Self-pay | Admitting: Podiatry

## 2024-03-23 ENCOUNTER — Encounter: Payer: Self-pay | Admitting: Podiatry

## 2024-03-23 DIAGNOSIS — L97522 Non-pressure chronic ulcer of other part of left foot with fat layer exposed: Secondary | ICD-10-CM

## 2024-03-23 NOTE — Progress Notes (Signed)
 Presents with a painful ulcer on the left foot at the first MTP.  Has been soaking and putting Bactroban  ointment on it.  She has been using the bunion pad which has not seemed to help much.  She has got a pair of Reebock's on today.  Physical Exam:  Patient alert and oriented x 3.  No complaints of nausea, vomiting, fever, or chills  Vascular: DP pulses palpable bilaterally. PT pulses nonpalpable bilaterally.  Minimal edema. Capillary fill time immediate.  Dermatologic: Deep full-thickness ulcer penetrating the subcutaneous tissue over the first MTP of the left foot ulcer  foot. Measures 3 mm wide x 3 mm long x 3 deep.  Moderate clear drainage.  No erythema.  Moderate exudate.  No undermining.  Neurologic:   Musculoskeletal: Hallux abductovalgus deformity left.  Tenderness dorsal medial aspect.  Radiographs: None  Diagnoses: Deep full-thickness penetrating subcutaneous tissue ulcer left foot.  Plan: -Dispensed surgical shoe left - Wound care: Soak 15 minutes warm Epsom salt water twice daily, apply Bactroban  ointment, apply bandage - Debrided deep full-thickness ulceration in the subcutaneous tissue left foot.  Debrided to a good bleeding base.  Debrided devitalized tissue.  Applied triple antibiotic and light dressing    Return 2 weeks f/u ulcer

## 2024-04-06 ENCOUNTER — Ambulatory Visit (INDEPENDENT_AMBULATORY_CARE_PROVIDER_SITE_OTHER): Admitting: Podiatry

## 2024-04-06 ENCOUNTER — Encounter: Payer: Self-pay | Admitting: Podiatry

## 2024-04-06 DIAGNOSIS — L97522 Non-pressure chronic ulcer of other part of left foot with fat layer exposed: Secondary | ICD-10-CM

## 2024-04-06 NOTE — Progress Notes (Signed)
 Patient presents for follow-up ulcer dorsal aspect first MTP left foot.  Has been doing wound care as instructed.  Says she is having much less pain now.  Has only noticed clear drainage from the wound  Physical Exam:  Patient alert and oriented x 3.  No complaints of nausea, vomiting, fever, or chills  Vascular: DP pulses 2/4 bilateral. PT pulses 0/4 bilateral.  Minimal edema. Capillary fill time immediate.  Dermatologic: Full-thickness deep ulceration penetrating subcutaneous tissue on the dorsal medial aspect first metatarsal phalangeal joint left. Measures 2.5 mm wide x 2.5 mm long x 2.5 deep.  Claire drainage.  No erythema.  Minimal exudate.  No undermining.  Granulation present.  Neurologic:   Musculoskeletal: Hallux abductovalgus [place] left  Radiographs: None today  Diagnoses: 1.  Deep full-thickness ulceration penetrating subcutaneous tissue dorsal aspect first MTP left foot.-Ulcer is improving.  Plan: -Deep full-thickness ulceration into the subcutaneous tissue dorsal aspect first MTP left foot.  Debrided any devitalized tissue.  Applied a light dressing. -Continue wound care: Soak in warm Epsom salt water twice daily for 15 minutes, apply Bactroban  ointment, and apply light dressing.   Return to f/u ulcer

## 2024-04-20 ENCOUNTER — Encounter: Payer: Self-pay | Admitting: Podiatry

## 2024-04-20 ENCOUNTER — Ambulatory Visit (INDEPENDENT_AMBULATORY_CARE_PROVIDER_SITE_OTHER): Admitting: Podiatry

## 2024-04-20 DIAGNOSIS — M79671 Pain in right foot: Secondary | ICD-10-CM

## 2024-04-20 DIAGNOSIS — B351 Tinea unguium: Secondary | ICD-10-CM | POA: Diagnosis not present

## 2024-04-20 DIAGNOSIS — M79672 Pain in left foot: Secondary | ICD-10-CM | POA: Diagnosis not present

## 2024-04-20 NOTE — Progress Notes (Signed)
 Patient presents for evaluation and treatment of tenderness and some redness around nails feet.  Tenderness around toes with walking and wearing shoes.  Also presents for follow-up ulceration dorsal medial aspect first MTP left foot.  Minimal discomfort at this area and says she has not noticed any drainage.  Physical exam:  General appearance: Alert, pleasant, and in no acute distress.  Vascular: Pedal pulses: DP 2/4 B/L, PT 0/4 B/L.  Mild edema lower legs bilaterally  Neurological:    Dermatologic:  Nails thickened, disfigured, discolored 1-5 BL with subungual debris.  Redness and hypertrophic nail folds along nail folds bilaterally but no signs of drainage or infection.  Ulceration on the dorsal dorsal medial aspect of the first metatarsal phalangeal joint left healed with no signs of infection.  Skin thin and atrophic lower extremity bilaterally.  No hair growth lower extremity bilaterally.  Areas at the lower extremity bilaterally  Musculoskeletal:  Hallux abductovalgus bilaterally   Diagnosis: 1. Painful onychomycotic nails 1 through 5 bilaterally. 2. Pain toes 1 through 5 bilaterally.  Plan: -Debrided onychomycotic nails 1 through 5 bilaterally. -She can discontinue wound care for the ulcer on the left foot.  She can return to regular shoes strongly advised her in the first helping her to not wear the Riebock's that she is wearing as her fairly narrow and will cause this problem again recommended new balance since they run wider in the forefoot.  This would be a better option for her.  Return 3 months RFC

## 2024-05-25 NOTE — Progress Notes (Unsigned)
 Cardiology CONSULT Note    Date:  05/26/2024   ID:  [name], DOB February 08, 1927, MRN [id number]  PCP:  Okey Carlin Redbird, MD  Cardiologist:  Wilbert Bihari, MD   Chief Complaint  Patient presents with   New Patient (Initial Visit)    Heart murmur    Patient Profile: [name] is a 89 y.o. female who is being seen today for the evaluation of heart murmur at the request of Okey Carlin Redbird, MD.  History of Present Illness:  [name] is a 89 y.o. female who is being seen today for the evaluation of heart murmur at the request of Okey Carlin Redbird, MD.  This is a 89 year old female with a history of CKD stage IIIb, GERD, hyperlipidemia, hypertension and osteoarthritis.  She was recently found to have a heart murmur by her PCP and is now referred for further evaluation.  She has been noticing SOB for the past year.  Prior to that she was able to walk with no SOB.  She now can barely walk in her house without getting SOB. She denies any chest pain or pressure, PND, orthopnea, syncope or palpitations. She also has been having LE edema that started about 2-3 months ago.    Past Medical History:  Diagnosis Date   Arthritis    Chronic kidney disease, stage 3b (HCC)    GERD (gastroesophageal reflux disease)    occ tums   Heart murmur    High cholesterol    Hyperlipemia    Hypertension    Osteoarthritis    Sleep disturbance    Vitamin D  deficiency     Past Surgical History:  Procedure Laterality Date   BACK SURGERY     CARPAL TUNNEL RELEASE Bilateral    HAND SURGERY Left    laceration artery,?tendon   HIP SURGERY Bilateral 04/2005   replacement   REVERSE SHOULDER ARTHROPLASTY Right 09/27/2016   Procedure: RIGHT REVERSE SHOULDER ARTHROPLASTY;  Surgeon: Marcey Her, MD;  Location: [facility] OR;  Service: Orthopedics;  Laterality: Right;   SHOULDER SURGERY Bilateral    rotator cuff repairs  several    Current Medications: Current Meds  Medication Sig    amLODipine  (NORVASC ) 5 MG tablet Take 5 mg by mouth daily.   aspirin  EC 81 MG tablet Take 81 mg by mouth daily.   cephALEXin (KEFLEX) 500 MG capsule Take 500 mg by mouth 4 (four) times daily.   cholecalciferol  (VITAMIN D ) 1000 units tablet Take 5,000 Units by mouth once a week.   FeFum-FePoly-FA-B Cmp-C-Biot (INTEGRA PLUS ) CAPS Take 1 capsule by mouth daily.   HYDROcodone -acetaminophen  (NORCO) 5-325 MG tablet Take 1 tablet by mouth every 6 (six) hours as needed for moderate pain.   HYDROcodone -acetaminophen  (NORCO/VICODIN) 5-325 MG per tablet Take 1 tablet by mouth every 6 (six) hours as needed for moderate pain.   Ibuprofen  200 MG CAPS Take 200 mg by mouth every 6 (six) hours as needed (pain).   Multiple Vitamin (MULTIVITAMIN WITH MINERALS) TABS tablet Take 1 tablet by mouth daily.   mupirocin  ointment (BACTROBAN ) 2 % Apply 1 Application topically 2 (two) times daily.   simvastatin  (ZOCOR ) 20 MG tablet Take 20 mg by mouth daily.   trolamine salicylate (ASPERCREME) 10 % cream Apply 1 application topically daily as needed for muscle pain.   valsartan -hydrochlorothiazide  (DIOVAN -HCT) 160-12.5 MG per tablet Take 2 tablets by mouth daily.     Allergies:   Codeine   Social History   Socioeconomic  History   Marital status: Widowed    Spouse name: Not on file   Number of children: Not on file   Years of education: Not on file   Highest education level: Not on file  Occupational History   Not on file  Tobacco Use   Smoking status: Never   Smokeless tobacco: Never  Substance and Sexual Activity   Alcohol use: No   Drug use: No   Sexual activity: Not on file  Other Topics Concern   Not on file  Social History Narrative   Not on file   Social Drivers of Health   Financial Resource Strain: Not on file  Food Insecurity: Not on file  Transportation Needs: Not on file  Physical Activity: Not on file  Stress: Not on file  Social Connections: Not on file     Family History:  The  patient's family history is not on file.   ROS:   Please see the history of present illness.    ROS All other systems reviewed and are negative.      No data to display             PHYSICAL EXAM:   VS:  BP (!) 160/62   Pulse 66   Ht 5' (1.524 m)   Wt 98 lb 6.4 oz (44.6 kg)   SpO2 98%   BMI 19.22 kg/m    GEN: Well nourished, well developed, in no acute distress  HEENT: normal  Neck: no JVD, carotid bruits, or masses Cardiac: RRR; no rubs, or gallops.  2-3+ BLE edema.  Intact distal pulses bilaterally. 2/6 harsh SM at the LLSB to apex Respiratory:  clear to auscultation bilaterally, normal work of breathing GI: soft, nontender, nondistended, + BS MS: no deformity or atrophy  Skin: warm and dry, no rash Neuro:  Alert and Oriented x 3, Strength and sensation are intact Psych: euthymic mood, full affect  Wt Readings from Last 3 Encounters:  05/26/24 98 lb 6.4 oz (44.6 kg)  02/03/24 94 lb (42.6 kg)  09/18/23 97 lb 12.8 oz (44.4 kg)      Studies/Labs Reviewed:   EKG Interpretation Date/Time:  Wednesday May 26 2024 10:06:58 EDT Ventricular Rate:  66 PR Interval:  210 QRS Duration:  100 QT Interval:  424 QTC Calculation: 444 R Axis:   -31  Text Interpretation: Sinus rhythm with sinus arrhythmia with 1st degree A-V block Left axis deviation Anteroseptal infarct (cited on or before 26-May-2024) When compared with ECG of 11-May-2022 18:46, Left axis deviation Left anterior fasicular block Confirmed by Shlomo Corning ([id number]) on 05/26/2024 10:16:10 AM       Recent Labs: 08/18/2023: ALT 11; BUN 42; Creatinine 1.62; Potassium 5.0; Sodium 135 09/18/2023: Hemoglobin 10.2; Platelet Count 269   Lipid Panel No results found for: CHOL, TRIG, HDL, CHOLHDL, VLDL, LDLCALC, LDLDIRECT    ASSESSMENT:    1. Heart murmur   2. Benign essential HTN      PLAN:  In order of problems listed above:  Heart murmur - She has a loud SM over the LLSB to the apex  suspicious for MR - she has sx of CHF including LE edema and SOB - Check 2D echo to assess for valvular heart disease  Hypertension - BP borderline controlled on exam today - she has significant LE edema which may be exacerbated by the amlodipine  - decrease Amlodipine  to 2.5mg  daily to see if LE edema improves - Stop Valsartan  HCT and place just on Valsartan   160mg  BID since I am changing diuretic to Lasix  - I have asked her to check her BP twice daily at lunch and dinner x 1 week then call with results  SOB LE edema -I suspect that she has acute CHF -she has 2-3+ BLE and now has DOE with minimal activity which is new -she has a Sysotlic Murmur on exam in the TV/MV area concerning for significant valvular disease -check BNP and BMET today -start Lasix  40mg  daily -check BMET in 1 week -followup with me in 1 week  Time Spent: 20 minutes total time of encounter, including 15 minutes spent in face-to-face patient care on the date of this encounter. This time includes coordination of care and counseling regarding above mentioned problem list. Remainder of non-face-to-face time involved reviewing chart documents/testing relevant to the patient encounter and documentation in the medical record. I have independently reviewed documentation from referring provider  Followup:  1 week with BMET  Medication Adjustments/Labs and Tests Ordered: Current medicines are reviewed at length with the patient today.  Concerns regarding medicines are outlined above.  Medication changes, Labs and Tests ordered today are listed in the Patient Instructions below.  There are no Patient Instructions on file for this visit.   Signed, Wilbert Bihari, MD  05/26/2024 10:16 AM    [facility] [address] [place], [place], [place]  [address] Phone: [phone]; Fax: [phone]

## 2024-05-26 ENCOUNTER — Ambulatory Visit: Attending: Cardiology | Admitting: Cardiology

## 2024-05-26 ENCOUNTER — Encounter: Payer: Self-pay | Admitting: Cardiology

## 2024-05-26 VITALS — BP 160/62 | HR 66 | Ht 60.0 in | Wt 98.4 lb

## 2024-05-26 DIAGNOSIS — I10 Essential (primary) hypertension: Secondary | ICD-10-CM | POA: Diagnosis present

## 2024-05-26 DIAGNOSIS — Z79899 Other long term (current) drug therapy: Secondary | ICD-10-CM | POA: Insufficient documentation

## 2024-05-26 DIAGNOSIS — R0602 Shortness of breath: Secondary | ICD-10-CM | POA: Diagnosis present

## 2024-05-26 DIAGNOSIS — R600 Localized edema: Secondary | ICD-10-CM | POA: Diagnosis present

## 2024-05-26 DIAGNOSIS — R011 Cardiac murmur, unspecified: Secondary | ICD-10-CM | POA: Insufficient documentation

## 2024-05-26 MED ORDER — VALSARTAN 160 MG PO TABS: 160.0000 mg | ORAL_TABLET | Freq: Two times a day (BID) | ORAL | 3 refills | Status: DC

## 2024-05-26 MED ORDER — AMLODIPINE BESYLATE 2.5 MG PO TABS: 2.5000 mg | ORAL_TABLET | Freq: Every day | ORAL | 3 refills | Status: DC

## 2024-05-26 MED ORDER — FUROSEMIDE 40 MG PO TABS: 40.0000 mg | ORAL_TABLET | Freq: Every day | ORAL | 3 refills | Status: DC

## 2024-05-26 NOTE — Patient Instructions (Addendum)
 Medication Instructions:  Please DECREASE your dose of amlodipine  to 2.5 mg daily.   Please STOP taking valsartan /hydrochlorothiazide .   Please START taking valsartan  160 mg twice a day.   Please START taking lasix  40 mg daily.   *If you need a refill on your cardiac medications before your next appointment, please call your pharmacy*  Lab Work: Please complete a BMET/BNP today in our first floor lab before you leave. You do not need to be fasting.  Please return on Monday 05/31/24 at 8:45 AM to complete a repeat BMET prior to your follow up appointment with Dr. Shlomo. You do not need to be fasting.   If you have labs (blood work) drawn today and your tests are completely normal, you will receive your results only by: MyChart Message (if you have MyChart) OR A paper copy in the mail If you have any lab test that is abnormal or we need to change your treatment, we will call you to review the results.  Testing/Procedures: Your physician has requested that you have an echocardiogram. Echocardiography is a painless test that uses sound waves to create images of your heart. It provides your doctor with information about the size and shape of your heart and how well your heart's chambers and valves are working. This procedure takes approximately one hour. There are no restrictions for this procedure. Please do NOT wear cologne, perfume, aftershave, or lotions (deodorant is allowed). Please arrive 15 minutes prior to your appointment time.  Please note: We ask at that you not bring children with you during ultrasound (echo/ vascular) testing. Due to room size and safety concerns, children are not allowed in the ultrasound rooms during exams. Our front office staff cannot provide observation of children in our lobby area while testing is being conducted. An adult accompanying a patient to their appointment will only be allowed in the ultrasound room at the discretion of the ultrasound technician  under special circumstances. We apologize for any inconvenience.   Follow-Up: At [facility], you and your health needs are our priority.  As part of our continuing mission to provide you with exceptional heart care, our providers are all part of one team.  This team includes your primary Cardiologist (physician) and Advanced Practice Providers or APPs (Physician Assistants and Nurse Practitioners) who all work together to provide you with the care you need, when you need it.  Your next appointment:   Monday 05/31/24 at 9:30 AM  Provider:   Dr. Wilbert Shlomo, MD   Other Instructions Please check your  blood pressure twice a day, once at lunch and once at dinner, for one week. Write down your blood pressure readings (along with the date, time and a heart rate if possible) and then bring your readings to your follow up appointment with Dr. Shlomo on 05/31/24.

## 2024-05-26 NOTE — Addendum Note (Signed)
 Addended by: JANIT GENI CROME on: 05/26/2024 10:43 AM   Modules accepted: Orders

## 2024-05-27 ENCOUNTER — Other Ambulatory Visit: Payer: Self-pay

## 2024-05-27 ENCOUNTER — Telehealth: Payer: Self-pay | Admitting: Podiatry

## 2024-05-27 ENCOUNTER — Ambulatory Visit: Payer: Self-pay | Admitting: Cardiology

## 2024-05-27 DIAGNOSIS — R600 Localized edema: Secondary | ICD-10-CM

## 2024-05-27 DIAGNOSIS — Z79899 Other long term (current) drug therapy: Secondary | ICD-10-CM

## 2024-05-27 DIAGNOSIS — I10 Essential (primary) hypertension: Secondary | ICD-10-CM

## 2024-05-27 DIAGNOSIS — R0602 Shortness of breath: Secondary | ICD-10-CM

## 2024-05-27 DIAGNOSIS — I350 Nonrheumatic aortic (valve) stenosis: Secondary | ICD-10-CM

## 2024-05-27 LAB — BASIC METABOLIC PANEL WITH GFR
BUN/Creatinine Ratio: 26 (ref 12–28)
BUN: 36 mg/dL (ref 10–36)
CO2: 21 mmol/L (ref 20–29)
Calcium: 9.1 mg/dL (ref 8.7–10.3)
Chloride: 98 mmol/L (ref 96–106)
Creatinine, Ser: 1.41 mg/dL — ABNORMAL HIGH (ref 0.57–1.00)
Glucose: 84 mg/dL (ref 70–99)
Potassium: 4.6 mmol/L (ref 3.5–5.2)
Sodium: 133 mmol/L — ABNORMAL LOW (ref 134–144)
eGFR: 34 mL/min/1.73 — ABNORMAL LOW (ref >59)

## 2024-05-27 LAB — PRO B NATRIURETIC PEPTIDE: NT-Pro BNP: 2469 pg/mL — ABNORMAL HIGH (ref 0–738)

## 2024-05-27 NOTE — Telephone Encounter (Signed)
 I had told her the same going forward since I was already in the process of during the Tele Chat.

## 2024-05-27 NOTE — Telephone Encounter (Signed)
 I contacted patient it looks like she may have refills on prescription asked to contact pharmacy.

## 2024-05-27 NOTE — Telephone Encounter (Signed)
 Requesting refill for: mupirocin  ointment (BACTROBAN ) 2 %   Send to pharmacy: [facility] at   [address] [place], [place] [address] tel:[phone]  Thank you

## 2024-05-27 NOTE — Progress Notes (Signed)
 BNP added to labs to be done 05/31/24.

## 2024-05-28 ENCOUNTER — Other Ambulatory Visit: Payer: Self-pay | Admitting: Podiatry

## 2024-05-28 ENCOUNTER — Telehealth: Payer: Self-pay | Admitting: Podiatry

## 2024-05-28 MED ORDER — MUPIROCIN 2 % EX OINT: 1.0000 | TOPICAL_OINTMENT | Freq: Two times a day (BID) | CUTANEOUS | 2 refills | Status: DC

## 2024-05-28 NOTE — Telephone Encounter (Signed)
 Patient stated she contacted pharmacy and pharmacy said there are no refills. Patient would like for provider to refill prescription. (Mupirocin  Oinment (Bactroban ) 2%

## 2024-05-31 ENCOUNTER — Encounter: Payer: Self-pay | Admitting: Cardiology

## 2024-05-31 ENCOUNTER — Ambulatory Visit: Attending: Cardiology | Admitting: Cardiology

## 2024-05-31 VITALS — BP 209/50 | HR 61 | Ht 60.0 in | Wt 98.2 lb

## 2024-05-31 DIAGNOSIS — R0602 Shortness of breath: Secondary | ICD-10-CM | POA: Insufficient documentation

## 2024-05-31 DIAGNOSIS — I10 Essential (primary) hypertension: Secondary | ICD-10-CM | POA: Diagnosis present

## 2024-05-31 DIAGNOSIS — Z79899 Other long term (current) drug therapy: Secondary | ICD-10-CM | POA: Insufficient documentation

## 2024-05-31 DIAGNOSIS — R011 Cardiac murmur, unspecified: Secondary | ICD-10-CM | POA: Diagnosis present

## 2024-05-31 DIAGNOSIS — R600 Localized edema: Secondary | ICD-10-CM | POA: Diagnosis present

## 2024-05-31 MED ORDER — VALSARTAN 320 MG PO TABS: 320.0000 mg | ORAL_TABLET | Freq: Every day | ORAL | 11 refills | Status: DC

## 2024-05-31 MED ORDER — AMLODIPINE BESYLATE 5 MG PO TABS: 5.0000 mg | ORAL_TABLET | Freq: Every day | ORAL | 3 refills | Status: AC

## 2024-05-31 NOTE — Progress Notes (Signed)
 Cardiology Note    Date:  05/31/2024   ID:  [name], DOB Jan 22, 1927, MRN [id number]  PCP:  Okey Carlin Redbird, MD  Cardiologist:  Wilbert Bihari, MD   Chief Complaint  Patient presents with   Follow-up    Heart murmur, shortness of breath, lower extremity edema, hypertension    History of Present Illness:  [name] is a 89 y.o. female with a history of CKD stage IIIb, GERD, hyperlipidemia, hypertension and osteoarthritis.  She was found to have a heart murmur by her PCP and referred for evaluation.  She has not had her 2D echo yet.  She also was found to have marked lower extremity edema and shortness of breath and was felt to be in acute CHF.  We stopped her valsartan  HCT and put her on the valsartan  160 mg twice daily along with starting Lasix  40 mg daily.  I decreased her amlodipine  to 2.5 mg to see if that would help with her lower extremity edema.  Her BNP was elevated at 2469.  She is now back for follow-up.  Her LE edema has improved after starting Lasix .  She denies any chest pain or pressure. She still has SOB and really feels there is no change. It resolves if she sits down and rests. Denies any palpitations.  No dizziness unless she gets up too fast.  No PND or orthopnea. Still has LE edema.   Past Medical History:  Diagnosis Date   Arthritis    Chronic kidney disease, stage 3b (HCC)    GERD (gastroesophageal reflux disease)    occ tums   Heart murmur    High cholesterol    Hyperlipemia    Hypertension    Osteoarthritis    Sleep disturbance    Vitamin D  deficiency     Past Surgical History:  Procedure Laterality Date   BACK SURGERY     CARPAL TUNNEL RELEASE Bilateral    HAND SURGERY Left    laceration artery,?tendon   HIP SURGERY Bilateral 04/2005   replacement   REVERSE SHOULDER ARTHROPLASTY Right 09/27/2016   Procedure: RIGHT REVERSE SHOULDER ARTHROPLASTY;  Surgeon: Marcey Her, MD;  Location: [facility];  Service: Orthopedics;  Laterality: Right;    SHOULDER SURGERY Bilateral    rotator cuff repairs  several    Current Medications: Current Meds  Medication Sig   amLODipine  (NORVASC ) 2.5 MG tablet Take 1 tablet (2.5 mg total) by mouth daily.   aspirin  EC 81 MG tablet Take 81 mg by mouth daily.   cephALEXin (KEFLEX) 500 MG capsule Take 500 mg by mouth 4 (four) times daily.   cholecalciferol  (VITAMIN D ) 1000 units tablet Take 5,000 Units by mouth once a week.   FeFum-FePoly-FA-B Cmp-C-Biot (INTEGRA PLUS ) CAPS Take 1 capsule by mouth daily.   furosemide  (LASIX ) 40 MG tablet Take 1 tablet (40 mg total) by mouth daily.   HYDROcodone -acetaminophen  (NORCO) 5-325 MG tablet Take 1 tablet by mouth every 6 (six) hours as needed for moderate pain.   HYDROcodone -acetaminophen  (NORCO/VICODIN) 5-325 MG per tablet Take 1 tablet by mouth every 6 (six) hours as needed for moderate pain.   Ibuprofen  200 MG CAPS Take 200 mg by mouth every 6 (six) hours as needed (pain).   Multiple Vitamin (MULTIVITAMIN WITH MINERALS) TABS tablet Take 1 tablet by mouth daily.   mupirocin  ointment (BACTROBAN ) 2 % Apply 1 Application topically 2 (two) times daily.   mupirocin  ointment (BACTROBAN ) 2 % Apply 1 Application topically 2 (  two) times daily.   simvastatin  (ZOCOR ) 20 MG tablet Take 20 mg by mouth daily.   trolamine salicylate (ASPERCREME) 10 % cream Apply 1 application topically daily as needed for muscle pain.   valsartan  (DIOVAN ) 160 MG tablet Take 1 tablet (160 mg total) by mouth 2 (two) times daily.    Allergies:   Codeine   Social History   Socioeconomic History   Marital status: Widowed    Spouse name: Not on file   Number of children: Not on file   Years of education: Not on file   Highest education level: Not on file  Occupational History   Not on file  Tobacco Use   Smoking status: Never   Smokeless tobacco: Never  Substance and Sexual Activity   Alcohol use: No   Drug use: No   Sexual activity: Not on file  Other Topics Concern   Not on  file  Social History Narrative   Not on file   Social Drivers of Health   Financial Resource Strain: Not on file  Food Insecurity: Not on file  Transportation Needs: Not on file  Physical Activity: Not on file  Stress: Not on file  Social Connections: Not on file     Family History:  The patient's family history is not on file.   ROS:   Please see the history of present illness.    ROS All other systems reviewed and are negative.      No data to display             PHYSICAL EXAM:   VS:  BP (!) 209/50 (BP Location: Left Arm, Patient Position: Sitting, Cuff Size: Small)   Pulse 61   Ht 5' (1.524 m)   Wt 98 lb 3.2 oz (44.5 kg)   SpO2 100%   BMI 19.18 kg/m    GEN: Well nourished, well developed in no acute distress HEENT: Normal NECK: No JVD; No carotid bruits LYMPHATICS: No lymphadenopathy CARDIAC:RRR, no murmurs, rubs, gallops RESPIRATORY:  Clear to auscultation without rales, wheezing or rhonchi  ABDOMEN: Soft, non-tender, non-distended MUSCULOSKELETAL:  2-3+ BLE edema; No deformity  SKIN: Warm and dry NEUROLOGIC:  Alert and oriented x 3 PSYCHIATRIC:  Normal affect and Wt Readings from Last 3 Encounters:  05/31/24 98 lb 3.2 oz (44.5 kg)  05/26/24 98 lb 6.4 oz (44.6 kg)  02/03/24 94 lb (42.6 kg)      Studies/Labs Reviewed:    Recent Labs: 08/18/2023: ALT 11 09/18/2023: Hemoglobin 10.2; Platelet Count 269 05/26/2024: BUN 36; Creatinine, Ser 1.41; NT-Pro BNP 2,469; Potassium 4.6; Sodium 133   Lipid Panel No results found for: CHOL, TRIG, HDL, CHOLHDL, VLDL, LDLCALC, LDLDIRECT    ASSESSMENT:    1. Heart murmur   2. Benign essential HTN   3. SOB (shortness of breath)   4. Bilateral leg edema       PLAN:  In order of problems listed above:  Heart murmur - She has a loud SM over the LLSB to the apex suspicious for MR - she has sx of CHF including LE edema and SOB - She was post to have a 2D echo but this has not been done yet  that does not help  Hypertension - BP markedly elevated on exam today but has not taken her meds this am.  BPs from home over the past week ranged from 141-164/55-42mmHg>>suspect she has a component of white coat HTN - she had significant LE edema at last OV so I  decreased amlodipine  to 2.5mg  daily but still has significant LE edema - I also stopped her valsartan  HCT and placed on valsartan  160 mg twice daily since we are changing her diuretic heart to Lasix   - Cannot use beta-blocker therapy due to resting heart rate of 60 - I am going to increase her amlodipine  back to 5 mg daily - Will change valsartan  to 320 mg daily instead of 160 mg twice daily - she does not think she can put TED hose on  SOB LE edema -I suspect that she has acute CHF -she has 2-3+ BLE and now has DOE with minimal activity which is new -she has a Sysotlic Murmur on exam in the TV/MV area concerning for significant valvular disease -BNP at last OV was 2469 -she was started on Lasix  40mg  daily but still has significant LE edema -will increase Lasix  to 40mg  BID -check BMET and BNP in 1 week  Time Spent: 20 minutes total time of encounter, including 15 minutes spent in face-to-face patient care on the date of this encounter. This time includes coordination of care and counseling regarding above mentioned problem list. Remainder of non-face-to-face time involved reviewing chart documents/testing relevant to the patient encounter and documentation in the medical record. I have independently reviewed documentation from referring provider  Followup:  1 week with BMET  Medication Adjustments/Labs and Tests Ordered: Current medicines are reviewed at length with the patient today.  Concerns regarding medicines are outlined above.  Medication changes, Labs and Tests ordered today are listed in the Patient Instructions below.  There are no Patient Instructions on file for this visit.   Signed, Wilbert Bihari, MD  05/31/2024  9:29 AM    [facility] [address] [place], [place], [place]  [address] Phone: [phone]; Fax: [phone]

## 2024-05-31 NOTE — Patient Instructions (Addendum)
 Medication Instructions:  Your physician has recommended you make the following change in your medication: INCREASE Amlodipine  (Norvasc ) to 5 mg daily INCREASE Diovan  to 320 mg once a day in the morning INCREASE Lasix  to twice daily until you see Dr. Shlomo   *If you need a refill on your cardiac medications before your next appointment, please call your pharmacy*  Lab Work: Your physician recommends that you return for lab work in: 1 weeks for a BMET & BNP   If you have any lab test that is abnormal or we need to change your treatment, we will call you to review the results.  Testing/Procedures: None ordered  Follow-Up: At [facility], you and your health needs are our priority.  As part of our continuing mission to provide you with exceptional heart care, our providers are all part of one team.  This team includes your primary Cardiologist (physician) and Advanced Practice Providers or APPs (Physician Assistants and Nurse Practitioners) who all work together to provide you with the care you need, when you need it.  Your next appointment:   1 week(s)  Provider:   Dr. Shlomo    Thank you for choosing [facility]!!   [phone]   Other Instructions  HIGH POTASSIUM FOODS 1. Salmon  2 Large White Beans 3 White Button Mushrooms 4. Potatoes  5. Acorn Squash 6. Milk 7. Avocados 8. Bananas 9. Tomato 10. Cantaloupe

## 2024-05-31 NOTE — Addendum Note (Signed)
 Addended by: GRETEL MAEOLA CROME on: 05/31/2024 10:01 AM   Modules accepted: Orders

## 2024-06-01 ENCOUNTER — Ambulatory Visit: Payer: Self-pay

## 2024-06-01 LAB — BASIC METABOLIC PANEL WITH GFR
BUN/Creatinine Ratio: 19 (ref 12–28)
BUN: 29 mg/dL (ref 10–36)
CO2: 22 mmol/L (ref 20–29)
Calcium: 9.2 mg/dL (ref 8.7–10.3)
Chloride: 99 mmol/L (ref 96–106)
Creatinine, Ser: 1.52 mg/dL — AB (ref 0.57–1.00)
Glucose: 90 mg/dL (ref 70–99)
Potassium: 5.3 mmol/L — ABNORMAL HIGH (ref 3.5–5.2)
Sodium: 135 mmol/L (ref 134–144)
eGFR: 31 mL/min/1.73 — AB (ref >59)

## 2024-06-01 LAB — PRO B NATRIURETIC PEPTIDE: NT-Pro BNP: 2478 pg/mL — ABNORMAL HIGH (ref 0–738)

## 2024-06-02 NOTE — Telephone Encounter (Signed)
 Call to [facility]) to request patient repeat labs next week, prior to f/u appt 8/18. Orders placed, left VM with detailed instructions to present to [facility], advised no fasting is needed.

## 2024-06-03 ENCOUNTER — Ambulatory Visit (HOSPITAL_COMMUNITY)
Admission: RE | Admit: 2024-06-03 | Discharge: 2024-06-03 | Disposition: A | Discharge status: Home / Self Care | Source: Ambulatory Visit | Attending: Cardiology | Admitting: Cardiology

## 2024-06-03 DIAGNOSIS — R011 Cardiac murmur, unspecified: Secondary | ICD-10-CM | POA: Insufficient documentation

## 2024-06-03 LAB — ECHOCARDIOGRAM COMPLETE
AR max vel: 0.98 cm2
AV Area VTI: 1.05 cm2
AV Area mean vel: 0.94 cm2
AV Mean grad: 10 mmHg
AV Peak grad: 19.2 mmHg
Ao pk vel: 2.19 m/s
Area-P 1/2: 4.49 cm2
P 1/2 time: 365 ms
S' Lateral: 3.1 cm

## 2024-06-04 ENCOUNTER — Encounter: Payer: Self-pay | Admitting: Cardiology

## 2024-06-04 ENCOUNTER — Ambulatory Visit: Payer: Self-pay | Admitting: Cardiology

## 2024-06-04 DIAGNOSIS — I350 Nonrheumatic aortic (valve) stenosis: Secondary | ICD-10-CM | POA: Insufficient documentation

## 2024-06-04 LAB — BASIC METABOLIC PANEL WITH GFR
BUN/Creatinine Ratio: 23 (ref 12–28)
BUN: 42 mg/dL — ABNORMAL HIGH (ref 10–36)
CO2: 23 mmol/L (ref 20–29)
Calcium: 9.2 mg/dL (ref 8.7–10.3)
Chloride: 92 mmol/L — ABNORMAL LOW (ref 96–106)
Creatinine, Ser: 1.81 mg/dL — ABNORMAL HIGH (ref 0.57–1.00)
Glucose: 166 mg/dL — ABNORMAL HIGH (ref 70–99)
Potassium: 4.6 mmol/L (ref 3.5–5.2)
Sodium: 134 mmol/L (ref 134–144)
eGFR: 25 mL/min/1.73 — ABNORMAL LOW (ref >59)

## 2024-06-04 LAB — PRO B NATRIURETIC PEPTIDE: NT-Pro BNP: 1988 pg/mL — ABNORMAL HIGH (ref 0–738)

## 2024-06-04 LAB — BRAIN NATRIURETIC PEPTIDE: BNP: 148 pg/mL — ABNORMAL HIGH (ref 0.0–100.0)

## 2024-06-04 NOTE — Telephone Encounter (Signed)
 Call to patient/ friend Rock [facility]) to discuss echo results. Left detailed message per DPR advising that 2D echo showed normal pumping function of the heart EF 55 to 60% with mildly thickened heart muscle and increase stiffness of the heart muscle called grade 1 diastolic dysfunction normal for her age. There is enlargement of the left atrium and right atrium.  She does have evidence of moderate aortic stenosis.  Placed order for repeat echo in 1 year.   Also advised that Cr is elevated and asked patient or friend Rock to call back regarding current dose of lasix . [facility] also sent.

## 2024-06-04 NOTE — Telephone Encounter (Signed)
-----   Message from Wilbert Bihari sent at 06/04/2024  9:31 AM EDT ----- 2D echo showed normal pumping function of the heart EF 55 to 60% with mildly thickened heart muscle and increase stiffness of the heart muscle called grade 1 diastolic dysfunction normal for her age.   There is enlargement of the left atrium and right atrium.  She does have evidence of moderate aortic stenosis.  Repeat echo in 1 year ----- Message ----- From: Interface, [employer] [facility] Sent: 06/03/2024  11:19 PM EDT To: Wilbert JONELLE Bihari, MD

## 2024-06-04 NOTE — Addendum Note (Signed)
 Addended by: JANIT GENI CROME on: 06/04/2024 10:47 AM   Modules accepted: Orders

## 2024-06-07 NOTE — Telephone Encounter (Signed)
-----   Message from Wilbert Bihari sent at 06/04/2024  2:51 PM EDT ----- BNP is only mildly elevated so waiting to hear what current dose she is taking of diuretics given her bump in renal function ----- Message ----- From: Interface, [facility] Lab Results In Sent: 06/04/2024   6:37 AM EDT To: Wilbert JONELLE Bihari, MD

## 2024-06-07 NOTE — Telephone Encounter (Signed)
 Call to patient to discuss lab results. Spoke with friend Rock (DPR) who states she fills patient's pill organizer. Asked Rock if patient has been taking lasix  40 mg BID since last visit with Dr. Shlomo on 05/31/24. Rock states she is away from patient's home but she will check and let our office know. Rock verifies she is aware of patient's follow up visit with Dr. Shlomo on 06/14/24 and that she will call back after she has checked patient's pill box.

## 2024-06-07 NOTE — Telephone Encounter (Signed)
 Patient's friend Rock [facility]) calling back to verify patient has been taking 40 mg lasix  BID since office visit with Dr. Shlomo 05/31/24. She states patient has been urinating a lot. Rock also verifies she is aware of patient's upcoming follow up visit with Dr. Shlomo on 06/14/24 and plans to help patient attend this appointment.

## 2024-06-11 NOTE — Telephone Encounter (Signed)
 Call to patient/CG [facility]) to advise Dr. Shlomo recommends to decrease lasix  to 40 daily. No answer, left detailed msg per DPR and sent [facility]. Also reminded patient of appt 8/18 with Dr. Shlomo.

## 2024-06-11 NOTE — Telephone Encounter (Signed)
-----   Message from Wilbert Bihari sent at 06/07/2024  4:40 PM EDT ----- Lets back off on Lasix  to 40mg  daily and check BMET and BNP the day she comes to see me 8/18 ----- Message ----- From: Janit Geni CROME, RN Sent: 06/07/2024   3:07 PM EDT To: Wilbert JONELLE Bihari, MD  ----- Message from Geni CROME Janit, RN sent at 06/07/2024  3:07 PM EDT -----   ----- Message ----- From: Bihari Wilbert JONELLE, MD Sent: 06/04/2024   2:51 PM EDT To: Geni CROME Janit, RN  BNP is only mildly elevated so waiting to hear what current dose she is taking of diuretics given her bump in renal function ----- Message ----- From: Interface, [facility] Lab Results In Sent: 06/04/2024   6:37 AM EDT To: Wilbert JONELLE Bihari, MD

## 2024-06-14 ENCOUNTER — Ambulatory Visit (HOSPITAL_COMMUNITY)
Admission: RE | Admit: 2024-06-14 | Discharge: 2024-06-14 | Disposition: A | Discharge status: Home / Self Care | Source: Ambulatory Visit | Attending: Cardiology | Admitting: Cardiology

## 2024-06-14 ENCOUNTER — Ambulatory Visit (INDEPENDENT_AMBULATORY_CARE_PROVIDER_SITE_OTHER): Admitting: Cardiology

## 2024-06-14 ENCOUNTER — Encounter: Payer: Self-pay | Admitting: Cardiology

## 2024-06-14 VITALS — BP 142/60 | HR 66 | Ht 60.0 in | Wt 98.0 lb

## 2024-06-14 DIAGNOSIS — R0602 Shortness of breath: Secondary | ICD-10-CM | POA: Diagnosis not present

## 2024-06-14 DIAGNOSIS — I350 Nonrheumatic aortic (valve) stenosis: Secondary | ICD-10-CM

## 2024-06-14 DIAGNOSIS — R600 Localized edema: Secondary | ICD-10-CM

## 2024-06-14 DIAGNOSIS — Z79899 Other long term (current) drug therapy: Secondary | ICD-10-CM

## 2024-06-14 DIAGNOSIS — J439 Emphysema, unspecified: Secondary | ICD-10-CM | POA: Insufficient documentation

## 2024-06-14 DIAGNOSIS — I10 Essential (primary) hypertension: Secondary | ICD-10-CM | POA: Insufficient documentation

## 2024-06-14 NOTE — Patient Instructions (Signed)
 Medication Instructions:  Your physician recommends that you continue on your current medications as directed. Please refer to the Current Medication list given to you today.  *If you need a refill on your cardiac medications before your next appointment, please call your pharmacy*  Lab Work: Please complete a BMET at our first floor lab today  before you leave.  If you have labs (blood work) drawn today and your tests are completely normal, you will receive your results only by: MyChart Message (if you have MyChart) OR A paper copy in the mail If you have any lab test that is abnormal or we need to change your treatment, we will call you to review the results.  Testing/Procedures: A chest x-ray takes a picture of the organs and structures inside the chest, including the heart, lungs, and blood vessels. This test can show several things, including, whether the heart is enlarges; whether fluid is building up in the lungs; and whether pacemaker / defibrillator leads are still in place.  Your physician has requested that you have an echocardiogram in one year. Echocardiography is a painless test that uses sound waves to create images of your heart. It provides your doctor with information about the size and shape of your heart and how well your heart's chambers and valves are working. This procedure takes approximately one hour. There are no restrictions for this procedure. Please do NOT wear cologne, perfume, aftershave, or lotions (deodorant is allowed). Please arrive 15 minutes prior to your appointment time.  Please note: We ask at that you not bring children with you during ultrasound (echo/ vascular) testing. Due to room size and safety concerns, children are not allowed in the ultrasound rooms during exams. Our front office staff cannot provide observation of children in our lobby area while testing is being conducted. An adult accompanying a patient to their appointment will only be allowed  in the ultrasound room at the discretion of the ultrasound technician under special circumstances. We apologize for any inconvenience.    Follow-Up: At [facility], you and your health needs are our priority.  As part of our continuing mission to provide you with exceptional heart care, our providers are all part of one team.  This team includes your primary Cardiologist (physician) and Advanced Practice Providers or APPs (Physician Assistants and Nurse Practitioners) who all work together to provide you with the care you need, when you need it.  Your next appointment:   6 month(s)  Provider:   Dr. Wilbert Bihari, MD   We recommend signing up for the patient portal called MyChart.  Sign up information is provided on this After Visit Summary.  MyChart is used to connect with patients for Virtual Visits (Telemedicine).  Patients are able to view lab/test results, encounter notes, upcoming appointments, etc.  Non-urgent messages can be sent to your provider as well.   To learn more about what you can do with MyChart, go to [email or web address].

## 2024-06-14 NOTE — Addendum Note (Signed)
 Addended by: JANIT GENI CROME on: 06/14/2024 03:51 PM   Modules accepted: Orders

## 2024-06-14 NOTE — Progress Notes (Signed)
 Cardiology Note    Date:  06/14/2024   ID:  RYA RAUSCH, DOB [birth year], MRN [id number]  PCP:  Okey Carlin Redbird, MD  Cardiologist:  Wilbert Bihari, MD   Chief Complaint  Patient presents with   Aortic Stenosis   Hypertension   Leg Swelling   Shortness of Breath    History of Present Illness:  [name] is a 89 y.o. female with a history of CKD stage IIIb, GERD, hyperlipidemia, hypertension and osteoarthritis. She was found to have a heart murmur by her PCP and referred for evaluation.  She also was found to have marked lower extremity edema and shortness of breath and was felt to be in acute CHF.  We stopped her valsartan  HCT and put her on the valsartan  160 mg twice daily along with starting Lasix  40 mg daily.  I decreased her amlodipine  to 2.5 mg to see if that would help with her lower extremity edema.  Her BNP was elevated at 2469.    2D echo done 06/03/2024 was fairly unremarkable with normal LV function EF 55 to 60% with mild LVH and G1 DD.  RV function normal with normal PAP, severe LAE, trivial MR/AR and moderate aortic valve stenosis with mean aortic valve gradient 10 mmHg, AVA 1.05 cm by VTI, DVI 0.37 and SVI 39.  She is back today for follow-up and is doing well.  Her lower extremity edema is controlled on diuretic therapy.  She denies any chest pain or pressure.  She denies any PND, orthopnea, palpitations, dizziness or syncope. She still has DOE with walking.  She cannot tell if it has improved on diuretics.   Past Medical History:  Diagnosis Date   Aortic stenosis    Moderate by echo 05/2024   Arthritis    Chronic kidney disease, stage 3b (HCC)    GERD (gastroesophageal reflux disease)    occ tums   Heart murmur    High cholesterol    Hyperlipemia    Hypertension    Osteoarthritis    Sleep disturbance    Vitamin D  deficiency     Past Surgical History:  Procedure Laterality Date   BACK SURGERY     CARPAL TUNNEL RELEASE Bilateral    HAND SURGERY  Left    laceration artery,?tendon   HIP SURGERY Bilateral 04/2005   replacement   REVERSE SHOULDER ARTHROPLASTY Right 09/27/2016   Procedure: RIGHT REVERSE SHOULDER ARTHROPLASTY;  Surgeon: Marcey Her, MD;  Location: [facility] OR;  Service: Orthopedics;  Laterality: Right;   SHOULDER SURGERY Bilateral    rotator cuff repairs  several    Current Medications: Current Meds  Medication Sig   amLODipine  (NORVASC ) 5 MG tablet Take 1 tablet (5 mg total) by mouth daily.   aspirin  EC 81 MG tablet Take 81 mg by mouth daily.   cephALEXin (KEFLEX) 500 MG capsule Take 500 mg by mouth 4 (four) times daily.   cholecalciferol  (VITAMIN D ) 1000 units tablet Take 5,000 Units by mouth once a week.   FeFum-FePoly-FA-B Cmp-C-Biot (INTEGRA PLUS ) CAPS Take 1 capsule by mouth daily.   furosemide  (LASIX ) 40 MG tablet Take 1 tablet (40 mg total) by mouth daily.   HYDROcodone -acetaminophen  (NORCO) 5-325 MG tablet Take 1 tablet by mouth every 6 (six) hours as needed for moderate pain.   HYDROcodone -acetaminophen  (NORCO/VICODIN) 5-325 MG per tablet Take 1 tablet by mouth every 6 (six) hours as needed for moderate pain.   Ibuprofen  200 MG CAPS Take 200 mg by  mouth every 6 (six) hours as needed (pain).   Multiple Vitamin (MULTIVITAMIN WITH MINERALS) TABS tablet Take 1 tablet by mouth daily.   mupirocin  ointment (BACTROBAN ) 2 % Apply 1 Application topically 2 (two) times daily.   mupirocin  ointment (BACTROBAN ) 2 % Apply 1 Application topically 2 (two) times daily.   simvastatin  (ZOCOR ) 20 MG tablet Take 20 mg by mouth daily.   trolamine salicylate (ASPERCREME) 10 % cream Apply 1 application topically daily as needed for muscle pain.   valsartan  (DIOVAN ) 320 MG tablet Take 1 tablet (320 mg total) by mouth daily.    Allergies:   Codeine   Social History   Socioeconomic History   Marital status: Widowed    Spouse name: Not on file   Number of children: Not on file   Years of education: Not on file   Highest education  level: Not on file  Occupational History   Not on file  Tobacco Use   Smoking status: Never   Smokeless tobacco: Never  Substance and Sexual Activity   Alcohol use: No   Drug use: No   Sexual activity: Not on file  Other Topics Concern   Not on file  Social History Narrative   Not on file   Social Drivers of Health   Financial Resource Strain: Not on file  Food Insecurity: Not on file  Transportation Needs: Not on file  Physical Activity: Not on file  Stress: Not on file  Social Connections: Not on file     Family History:  The patient's family history is not on file.   ROS:   Please see the history of present illness.    ROS All other systems reviewed and are negative.      No data to display             PHYSICAL EXAM:   VS:  BP (!) 142/60   Pulse 66   Ht 5' (1.524 m)   Wt 98 lb (44.5 kg)   SpO2 99%   BMI 19.14 kg/m    GEN: Well nourished, well developed in no acute distress HEENT: Normal NECK: No JVD; No carotid bruits LYMPHATICS: No lymphadenopathy CARDIAC:RRR, no murmurs, rubs, gallops RESPIRATORY:  Clear to auscultation without rales, wheezing or rhonchi  ABDOMEN: Soft, non-tender, non-distended MUSCULOSKELETAL:  No edema; No deformity  SKIN: Warm and dry NEUROLOGIC:  Alert and oriented x 3 PSYCHIATRIC:  Normal affect  Wt Readings from Last 3 Encounters:  06/14/24 98 lb (44.5 kg)  05/31/24 98 lb 3.2 oz (44.5 kg)  05/26/24 98 lb 6.4 oz (44.6 kg)      Studies/Labs Reviewed:    Recent Labs: 08/18/2023: ALT 11 09/18/2023: Hemoglobin 10.2; Platelet Count 269 06/03/2024: BNP 148.0; BUN 42; Creatinine, Ser 1.81; NT-Pro BNP 1,988; Potassium 4.6; Sodium 134   Lipid Panel No results found for: CHOL, TRIG, HDL, CHOLHDL, VLDL, LDLCALC, LDLDIRECT    ASSESSMENT:    1. Nonrheumatic aortic valve stenosis   2. Benign essential HTN   3. SOB (shortness of breath)   4. Bilateral leg edema        PLAN:  In order of problems  listed above:  Heart murmur Aortic stenosis - She has a loud SM over the LLSB to the apex  - 2D echo showed normal LV function EF 55 to 60% with G1 DD, trivial MR and AR and moderate aortic valve stenosis with DVI 0.39 and mean gradient 10 mmHg - Repeat echo in 1 year  Hypertension -  She does have a component of whitecoat hypertension - We initially tried to drop down her amlodipine  because of lower extremity edema but due to ongoing hypertension her amlodipine  was increased back to 5 mg daily - Cannot use beta-blocker therapy due to resting heart rate of 60 - Continue amlodipine  5 mg daily, valsartan  320 mg daily and Lasix  40 mg daily with as needed refills  SOB LE edema -she has 2-3+ BLE and now has DOE with minimal activity which is new -Suspect she has diastolic CHF but likely chronic venous insufficiency due to advanced age and sedentary state and dietary indiscretion with sodium -BNP at last OV was 2469 -she was started on Lasix  40mg  daily had significant lower extremity edema that persisted so Lasix  was increased to 40 mg twice daily -Unfortunately SCr bumped to 1.81 so her Lasix  was decreased back to 40 mg daily -her LE edema has significantly improved -she still uses salt on her food and we discussed getting rid of all the salt and use Mrs. Dash substitute -continue Lasix  40mg  daily -repeat BMET -she still has SOB but does not want to pursue any invasive studies so stress testing not indicated -check CXray for SOB  Time Spent: 20 minutes total time of encounter, including 15 minutes spent in face-to-face patient care on the date of this encounter. This time includes coordination of care and counseling regarding above mentioned problem list. Remainder of non-face-to-face time involved reviewing chart documents/testing relevant to the patient encounter and documentation in the medical record. I have independently reviewed documentation from referring provider  Followup:  6  months  Medication Adjustments/Labs and Tests Ordered: Current medicines are reviewed at length with the patient today.  Concerns regarding medicines are outlined above.  Medication changes, Labs and Tests ordered today are listed in the Patient Instructions below.  There are no Patient Instructions on file for this visit.   Signed, Wilbert Bihari, MD  06/14/2024 3:31 PM    [facility] [address] [place], [place], [place]  [address] Phone: [phone]; Fax: [phone]

## 2024-06-15 ENCOUNTER — Ambulatory Visit: Payer: Self-pay | Admitting: Cardiology

## 2024-06-15 DIAGNOSIS — I10 Essential (primary) hypertension: Secondary | ICD-10-CM

## 2024-06-15 DIAGNOSIS — R0602 Shortness of breath: Secondary | ICD-10-CM

## 2024-06-15 DIAGNOSIS — Z79899 Other long term (current) drug therapy: Secondary | ICD-10-CM

## 2024-06-15 DIAGNOSIS — R600 Localized edema: Secondary | ICD-10-CM

## 2024-06-15 LAB — BASIC METABOLIC PANEL WITH GFR
BUN/Creatinine Ratio: 30 — ABNORMAL HIGH (ref 12–28)
BUN: 61 mg/dL — ABNORMAL HIGH (ref 10–36)
CO2: 24 mmol/L (ref 20–29)
Calcium: 9.4 mg/dL (ref 8.7–10.3)
Chloride: 94 mmol/L — ABNORMAL LOW (ref 96–106)
Creatinine, Ser: 2.04 mg/dL — ABNORMAL HIGH (ref 0.57–1.00)
Glucose: 95 mg/dL (ref 70–99)
Potassium: 4.3 mmol/L (ref 3.5–5.2)
Sodium: 138 mmol/L (ref 134–144)
eGFR: 22 mL/min/1.73 — ABNORMAL LOW (ref >59)

## 2024-06-16 ENCOUNTER — Other Ambulatory Visit: Payer: Self-pay

## 2024-06-16 ENCOUNTER — Telehealth: Payer: Self-pay | Admitting: Cardiology

## 2024-06-16 DIAGNOSIS — R600 Localized edema: Secondary | ICD-10-CM

## 2024-06-16 DIAGNOSIS — R911 Solitary pulmonary nodule: Secondary | ICD-10-CM

## 2024-06-16 DIAGNOSIS — Z79899 Other long term (current) drug therapy: Secondary | ICD-10-CM

## 2024-06-16 DIAGNOSIS — R0602 Shortness of breath: Secondary | ICD-10-CM

## 2024-06-16 NOTE — Telephone Encounter (Signed)
 Called to discuss CXR results with patient/friend [name] (DPR). No answer, left VM asking someone to call our office to discuss CXR results. Stat referral to pulmonary placed.

## 2024-06-16 NOTE — Telephone Encounter (Signed)
 Friend was returning call. Please advise

## 2024-06-16 NOTE — Telephone Encounter (Signed)
 Spoke with radiology, they want to make sure Dr Shlomo sees the chest x-ray report. Will forward to Dr Shlomo,  IMPRESSION: 1. Possible 3 cm right apical lung mass. Correlation with CT chest recommended if the patient would be a therapy candidate should neoplasm be detected. 2. Emphysema.

## 2024-06-16 NOTE — Telephone Encounter (Signed)
 Calling to give Call Report for Chest Xray. Call transferred

## 2024-06-16 NOTE — Telephone Encounter (Addendum)
  Wilbert JONELLE Bihari, MD 06/16/2024  9:51 AM EDT     Patient found to have a 3 cm lung mass on chest x-ray.  I would like to get her in with pulmonary ASAP and forward a copy of this to her PCP   Twyla Handing (dpr) the information above. Gave her the phone number to [facility]- Pulmonology. She reports that they already called and she missed it. Instructed her to call them back for follow up. She verbalized understanding.   Also  routed result to PCP

## 2024-06-16 NOTE — Telephone Encounter (Signed)
 Returned call to patient's friend Rock left message on personal voice mail to call back.

## 2024-06-16 NOTE — Telephone Encounter (Signed)
 Pt's friend Rock was returning nurse call and is requesting a callback. Please advise

## 2024-06-21 NOTE — Telephone Encounter (Signed)
-----   Message from Wilbert Bihari sent at 06/15/2024  8:31 AM EDT ----- Serum creatinine has actually increased more despite decreasing Lasix .  I would like her to hold Lasix  x 2 days and then go to 20 mg daily repeat bmet on 8/22 ----- Message ----- From: Interface, [facility] Lab Results In Sent: 06/15/2024   1:35 AM EDT To: Wilbert JONELLE Bihari, MD

## 2024-06-21 NOTE — Telephone Encounter (Signed)
 Call to patient/Friend Rock [facility]), no answer. Left detailed message per DPR asking if they had stopped lasix  and if they could complete labs as soon as possible. Also advised I had sent [facility] message.

## 2024-06-24 NOTE — Addendum Note (Signed)
 Addended by: JANIT GENI CROME on: 06/24/2024 11:24 AM   Modules accepted: Orders

## 2024-06-24 NOTE — Telephone Encounter (Signed)
-----   Message from Wilbert Bihari sent at 06/15/2024  8:31 AM EDT ----- Serum creatinine has actually increased more despite decreasing Lasix .  I would like her to hold Lasix  x 2 days and then go to 20 mg daily repeat bmet on 8/22 ----- Message ----- From: Interface, [facility] Lab Results In Sent: 06/15/2024   1:35 AM EDT To: Wilbert JONELLE Bihari, MD

## 2024-06-24 NOTE — Telephone Encounter (Signed)
 Call to patient/Friend Rock [facility]) in regards to need for BMET due to elevated Cr despite decreasing lasix . Rock states she did get my message on 06/21/24 and is present at [facility] now with patient to do BMET. Rock advises that she held patient's lasix  on 06/22/24 and 06/22/24 and patient has taken no lasix  today. Myles Rock to hold lasix  til we call her with further instructions. Linda verbalizes understanding.

## 2024-06-25 LAB — BASIC METABOLIC PANEL WITH GFR
BUN/Creatinine Ratio: 29 — ABNORMAL HIGH (ref 12–28)
BUN: 43 mg/dL — ABNORMAL HIGH (ref 10–36)
CO2: 24 mmol/L (ref 20–29)
Calcium: 9.4 mg/dL (ref 8.7–10.3)
Chloride: 97 mmol/L (ref 96–106)
Creatinine, Ser: 1.5 mg/dL — ABNORMAL HIGH (ref 0.57–1.00)
Glucose: 150 mg/dL — ABNORMAL HIGH (ref 70–99)
Potassium: 4.9 mmol/L (ref 3.5–5.2)
Sodium: 136 mmol/L (ref 134–144)
eGFR: 32 mL/min/1.73 — ABNORMAL LOW (ref >59)

## 2024-06-29 NOTE — Telephone Encounter (Signed)
 Call to patient and spoke to [facility]). Advised that Cr improved, [name] reports no leg edema and that patient has been off lasix  since 06/22/24. [name] asking if she should resume a smaller dose of lasix  if legs become swollen again. Patient responses forwarded to Dr. Shlomo.

## 2024-06-29 NOTE — Telephone Encounter (Signed)
-----   Message from Nurse Sharlet SQUIBB sent at 06/25/2024  6:00 PM EDT -----  ----- Message ----- From: Shlomo Wilbert SAUNDERS, MD Sent: 06/25/2024  12:44 PM EDT To: [facility] [place] Triage  SCr improved - please see if she is still having LE edema ----- Message ----- From: Interface, [facility] Lab Results In Sent: 06/25/2024  12:35 AM EDT To: Wilbert SAUNDERS Shlomo, MD

## 2024-07-09 ENCOUNTER — Ambulatory Visit

## 2024-07-20 ENCOUNTER — Encounter: Payer: Self-pay | Admitting: Acute Care

## 2024-07-20 ENCOUNTER — Ambulatory Visit: Admitting: Acute Care

## 2024-07-20 ENCOUNTER — Encounter: Payer: Self-pay | Admitting: Podiatry

## 2024-07-20 ENCOUNTER — Ambulatory Visit (INDEPENDENT_AMBULATORY_CARE_PROVIDER_SITE_OTHER): Admitting: Podiatry

## 2024-07-20 VITALS — BP 173/51 | HR 64 | Temp 97.8°F | Ht 60.0 in | Wt 98.0 lb

## 2024-07-20 DIAGNOSIS — R9389 Abnormal findings on diagnostic imaging of other specified body structures: Secondary | ICD-10-CM

## 2024-07-20 DIAGNOSIS — B351 Tinea unguium: Secondary | ICD-10-CM | POA: Diagnosis not present

## 2024-07-20 DIAGNOSIS — R634 Abnormal weight loss: Secondary | ICD-10-CM | POA: Diagnosis not present

## 2024-07-20 DIAGNOSIS — J439 Emphysema, unspecified: Secondary | ICD-10-CM | POA: Diagnosis not present

## 2024-07-20 DIAGNOSIS — R0609 Other forms of dyspnea: Secondary | ICD-10-CM | POA: Diagnosis not present

## 2024-07-20 DIAGNOSIS — M79672 Pain in left foot: Secondary | ICD-10-CM

## 2024-07-20 DIAGNOSIS — R911 Solitary pulmonary nodule: Secondary | ICD-10-CM

## 2024-07-20 DIAGNOSIS — Z7722 Contact with and (suspected) exposure to environmental tobacco smoke (acute) (chronic): Secondary | ICD-10-CM

## 2024-07-20 DIAGNOSIS — M79671 Pain in right foot: Secondary | ICD-10-CM | POA: Diagnosis not present

## 2024-07-20 NOTE — Progress Notes (Signed)
 History of Present Illness [name] is a 89 y.o. female never smoker referred to see Dr. Shelah for abnormal chest imaging   07/25/2024 Discussed the use of AI scribe software for clinical note transcription with the patient, who gave verbal consent to proceed.  History of Present Illness [name] is a 89 year old female with emphysema who presents with shortness of breath and a lung nodule.  She has been experiencing shortness of breath for an extended period, particularly with walking. A recent chest x-ray revealed a 3 cm shadow in the right apex of her lung. No hemoptysis. She has a history of emphysema, potentially related to secondhand smoke exposure from her husband and son, both of whom smoked and died of lung cancer.  She has experienced significant weight loss, from 130 pounds to 98 pounds over the past three years, amounting to a loss of 32 pounds.  She denies any hemoptysis.  Her past medical history includes high blood pressure, high cholesterol, skin cancer, gallbladder surgery, and neck and back surgery. She experiences anxiety, hand and feet swelling, and joint stiffness. 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 She has adult children, one of whom is deceased, and no family living nearby, only four nephews who live elsewhere.  Plan will be for a dedicated CT of the chest to get a better idea of the involvement of this lung nodule.  Once CT has been completed patient will follow-up with me to review the results and determine next best steps in plan of care.  Both patient and her son are in agreement with this plan.     Test Results: 06/14/2024 CXR Possible 3 cm right apical lung mass. Correlation with CT chest recommended if the patient would be a therapy candidate should neoplasm be detected. Emphysema.    Latest Ref Rng & Units 09/18/2023    9:29 AM 08/18/2023   11:52 AM 09/28/2016    5:54 AM  CBC  WBC 4.0 - 10.5 K/uL 4.7  4.5    Hemoglobin  12.0 - 15.0 g/dL 89.7  89.5  9.2   Hematocrit 36.0 - 46.0 % 32.3  32.0  28.3   Platelets 150 - 400 K/uL 269  212         Latest Ref Rng & Units 06/24/2024   12:05 PM 06/14/2024    4:31 PM 06/03/2024   11:28 AM  BMP  Glucose 70 - 99 mg/dL 849  95  833   BUN 10 - 36 mg/dL 43  61  42   Creatinine 0.57 - 1.00 mg/dL 8.49  7.95  8.18   BUN/Creat Ratio 12 - 28 29  30  23    Sodium 134 - 144 mmol/L 136  138  134   Potassium 3.5 - 5.2 mmol/L 4.9  4.3  4.6   Chloride 96 - 106 mmol/L 97  94  92   CO2 20 - 29 mmol/L 24  24  23    Calcium 8.7 - 10.3 mg/dL 9.4  9.4  9.2     BNP    Component Value Date/Time   BNP 148.0 (H) 06/03/2024 1128    ProBNP    Component Value Date/Time   PROBNP 1,988 (H) 06/03/2024 1127    PFT No results found for: FEV1PRE, FEV1POST, FVCPRE, FVCPOST, TLC, DLCOUNC, PREFEV1FVCRT, PSTFEV1FVCRT  No results found.   Past medical hx Past Medical History:  Diagnosis Date   Aortic stenosis    Moderate by echo  05/2024   Arthritis    Chronic kidney disease, stage 3b (HCC)    GERD (gastroesophageal reflux disease)    occ tums   Heart murmur    High cholesterol    Hyperlipemia    Hypertension    Osteoarthritis    Sleep disturbance    Vitamin D  deficiency      Social History   Tobacco Use   Smoking status: Never   Smokeless tobacco: Never  Substance Use Topics   Alcohol use: No   Drug use: No    Ms.Hollick reports that she has never smoked. She has never used smokeless tobacco. She reports that she does not drink alcohol and does not use drugs.  Tobacco Cessation: Counseling given: Not Answered Never smoker Significant secondhand smoke exposure  Past surgical hx, Family hx, Social hx all reviewed.  Current Outpatient Medications on File Prior to Visit  Medication Sig   amLODipine  (NORVASC ) 5 MG tablet Take 1 tablet (5 mg total) by mouth daily.   cholecalciferol  (VITAMIN D ) 1000 units tablet Take 5,000 Units by mouth once a week.    FeFum-FePoly-FA-B Cmp-C-Biot (INTEGRA PLUS ) CAPS Take 1 capsule by mouth daily.   Multiple Vitamin (MULTIVITAMIN WITH MINERALS) TABS tablet Take 1 tablet by mouth daily.   mupirocin  ointment (BACTROBAN ) 2 % Apply 1 Application topically 2 (two) times daily.   simvastatin  (ZOCOR ) 20 MG tablet Take 20 mg by mouth daily.   trolamine salicylate (ASPERCREME) 10 % cream Apply 1 application topically daily as needed for muscle pain.   valsartan  (DIOVAN ) 320 MG tablet Take 1 tablet (320 mg total) by mouth daily.   Ibuprofen  200 MG CAPS Take 200 mg by mouth every 6 (six) hours as needed (pain). (Patient not taking: Reported on 07/20/2024)   No current facility-administered medications on file prior to visit.     Allergies  Allergen Reactions   Codeine Other (See Comments)    Insomnia     Review Of Systems:  Constitutional:   + weight loss, No night sweats,  Fevers, chills, fatigue, or  lassitude.  HEENT:   No headaches,  Difficulty swallowing,  Tooth/dental problems, or  Sore throat,                No sneezing, itching, ear ache, nasal congestion, post nasal drip,   CV:  No chest pain,  Orthopnea, PND, swelling in lower extremities, anasarca, dizziness, palpitations, syncope.   GI  No heartburn, indigestion, abdominal pain, nausea, vomiting, diarrhea, change in bowel habits, loss of appetite, bloody stools.   Resp: + shortness of breath with exertion less at rest.  No excess mucus, no productive cough,  No non-productive cough,  No coughing up of blood.  No change in color of mucus.  No wheezing.  No chest wall deformity  Skin: no rash or lesions.  GU: no dysuria, change in color of urine, no urgency or frequency.  No flank pain, no hematuria   MS:  No joint pain or swelling.  No decreased range of motion.  No back pain.  Psych:  No change in mood or affect. No depression or anxiety.  No memory loss.   Vital Signs BP (!) 173/51   Pulse 64   Temp 97.8 F (36.6 C) (Oral)   Ht 5'  (1.524 m)   Wt 98 lb (44.5 kg)   SpO2 (!) 64%   BMI 19.14 kg/m    Physical Exam:  General- No distress,  A&Ox3, pleasant ENT: No sinus tenderness,  TM clear, pale nasal mucosa, no oral exudate,no post nasal drip, no LAN Cardiac: S1, S2, regular rate and rhythm, no murmur Chest: No wheeze/ rales/ dullness; no accessory muscle use, no nasal flaring, no sternal retractions, diminished per bases Abd.: Soft Non-tender, nondistended, bowel sounds positive,Body mass index is 19.14 kg/m.  Ext: No clubbing cyanosis, edema, 1+ lower extremity edema Neuro: Physical deconditioning, alert and oriented x 3, moving all extremities x 4 Skin: No rashes, warm and dry, no obvious skin lesions Psych: normal mood and behavior   Assessment and Plan Assessment & Plan Pulmonary nodule, right lung apex 3 cm nodule in right lung apex.  Concern for malignancy due to secondhand smoke exposure and family history of lung cancer.  Patient herself is a never smoker - Order CT scan of chest without contrast. - Discuss potential next steps post-CT, including PET scan or biopsy. - Consider SBRT if PET positive and biopsy not feasible.  Shortness of breath on exertion Chronic exertional dyspnea possibly related to emphysema and pulmonary nodule.  Emphysema Likely due to secondhand smoke exposure, contributing to respiratory symptoms.  Unintentional weight loss Weight loss from 130 lbs to 98 lbs over three years.  AVS 07/20/2024 It is good to see you today. We have reviewed your chest x-ray that indicates there is a 3 cm mass in the right apex of your lung. We will do a CT of the chest to get a better look at this nodule. You will get a call to get this scheduled. You will follow-up with me Wednesday, August 04, 2024 at 9 AM to review the results. Based on the results we will determine next best steps in plan of care. Please call if you have any questions or concerns. Please contact office for sooner follow  up if symptoms do not improve or worsen or seek emergency care   I spent 30 minutes dedicated to the care of this patient on the date of this encounter to include pre-visit review of records, face-to-face time with the patient discussing conditions above, post visit ordering of testing, clinical documentation with the electronic health record, making appropriate referrals as documented, and communicating necessary information to the patient's healthcare team.      Lauraine JULIANNA Lites, NP 07/25/2024  1:16 PM

## 2024-07-20 NOTE — Patient Instructions (Signed)
 It is good to see you today. We have reviewed your chest x-ray that indicates there is a 3 cm mass in the right apex of your lung. We will do a CT of the chest to get a better look at this nodule. You will get a call to get this scheduled. You will follow-up with me Wednesday, August 04, 2024 at 9 AM to review the results. Based on the results we will determine next best steps in plan of care. Please call if you have any questions or concerns. Please contact office for sooner follow up if symptoms do not improve or worsen or seek emergency care

## 2024-07-20 NOTE — Progress Notes (Signed)
 Patient presents for evaluation and treatment of tenderness and some redness around nails feet.  Tenderness around toes with walking and wearing shoes.  Physical exam:  General appearance: Alert, pleasant, and in no acute distress.  Vascular: Pedal pulses: DP 2/4 B/L, PT 0/4 B/L. Mild edema lower legs bilaterally  Neu  Dermatologic:  Nails thickened, disfigured, discolored 1-5 BL with subungual debris.  Redness and hypertrophic nail folds along nail folds bilaterally but no signs of drainage or infection.  Musculoskeletal:     Diagnosis: 1. Painful onychomycotic nails 1 through 5 bilaterally. 2. Pain toes 1 through 5 bilaterally.  Plan: -Debrided onychomycotic nails 1 through 5 bilaterally.  Sharply debrided nails with nail clipper and reduced with a power bur.  Return 3 months RFC

## 2024-07-25 ENCOUNTER — Encounter: Payer: Self-pay | Admitting: Acute Care

## 2024-07-26 ENCOUNTER — Ambulatory Visit
Admission: RE | Admit: 2024-07-26 | Discharge: 2024-07-26 | Disposition: A | Source: Ambulatory Visit | Attending: Acute Care | Admitting: Acute Care

## 2024-07-26 DIAGNOSIS — R911 Solitary pulmonary nodule: Secondary | ICD-10-CM

## 2024-08-04 ENCOUNTER — Encounter: Payer: Self-pay | Admitting: Acute Care

## 2024-08-04 ENCOUNTER — Ambulatory Visit (INDEPENDENT_AMBULATORY_CARE_PROVIDER_SITE_OTHER): Admitting: Acute Care

## 2024-08-04 ENCOUNTER — Ambulatory Visit (INDEPENDENT_AMBULATORY_CARE_PROVIDER_SITE_OTHER): Admitting: Otolaryngology

## 2024-08-04 ENCOUNTER — Encounter (INDEPENDENT_AMBULATORY_CARE_PROVIDER_SITE_OTHER): Payer: Self-pay | Admitting: Otolaryngology

## 2024-08-04 VITALS — BP 173/67 | HR 76 | Temp 97.9°F | Ht 60.0 in | Wt 101.6 lb

## 2024-08-04 VITALS — BP 163/67 | HR 68 | Temp 97.4°F | Ht 60.0 in | Wt 101.0 lb

## 2024-08-04 DIAGNOSIS — R0609 Other forms of dyspnea: Secondary | ICD-10-CM | POA: Diagnosis not present

## 2024-08-04 DIAGNOSIS — H6123 Impacted cerumen, bilateral: Secondary | ICD-10-CM

## 2024-08-04 DIAGNOSIS — J849 Interstitial pulmonary disease, unspecified: Secondary | ICD-10-CM

## 2024-08-04 DIAGNOSIS — R911 Solitary pulmonary nodule: Secondary | ICD-10-CM

## 2024-08-04 DIAGNOSIS — R9389 Abnormal findings on diagnostic imaging of other specified body structures: Secondary | ICD-10-CM

## 2024-08-04 DIAGNOSIS — R918 Other nonspecific abnormal finding of lung field: Secondary | ICD-10-CM | POA: Diagnosis not present

## 2024-08-04 NOTE — Progress Notes (Signed)
 Patient ID: [name], female   DOB: October 26, 1927, 89 y.o.   MRN: [id number]  Follow-up: Recurrent cerumen impaction  Procedure: Bilateral cerumen disimpaction.   Indication: Cerumen impaction, resulting in ear discomfort and conductive hearing loss.   Description: The patient is placed supine on the operating table. Under the operating microscope, the right ear canal is examined and is noted to be impacted with cerumen. The cerumen is carefully removed with a combination of suction catheters, cerumen curette, and alligator forceps. After the cerumen removal, the ear canal and tympanic membrane are noted to be normal. No middle ear effusion is noted. The same procedure is then repeated on the left side without exception. The patient tolerated the procedure well.  Follow-up care:  The patient is instructed not to use Q-tips to clean the ear canals. The patient will follow up in 6 months.

## 2024-08-04 NOTE — Progress Notes (Signed)
 History of Present Illness [name] is a 89 y.o. female never smoker referred to see Dr. Shelah for abnormal chest imaging    08/04/2024 Discussed the use of AI scribe software for clinical note transcription with the patient, who gave verbal consent to proceed.  History of Present Illness She has been experiencing shortness of breath for an extended period, particularly with walking. A recent chest x-ray revealed a 3 cm shadow in the right apex of her lung. No hemoptysis. She has a history of emphysema, potentially related to secondhand smoke exposure from her husband and son, both of whom smoked and died of lung cancer.   She has experienced significant weight loss, from 130 pounds to 98 pounds over the past three years, amounting to a loss of 32 pounds.  She denies any hemoptysis.   Her past medical history includes high blood pressure, high cholesterol, skin cancer, gallbladder surgery, and neck and back surgery. She experiences anxiety, hand and feet swelling, and joint stiffness. 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 She has adult children, one of whom is deceased, and no family living nearby, only four nephews who live elsewhere.  Plan after seeing her 07/20/2024 was for a dedicated CT Chest to better evaluate the lung nodule.   She states she has been doing well since the last visit. She does continue to experience dyspnea. She is in a wheel Chair today. Sats are 98% on RA.   We have reviewed her Ct Chest. She has several lung nodules, the largest of which is 7 mm. Plan is for a 6 month follow up CT to monitor for changes or growth. Patient wants this done despite her age. We did discuss this.   There was also notation of chronic ILD on the scan. We discussed that this is a disease of progressive scarring that causes shortness of breath. We will do PFT's to better evaluate. She is frail, and I am unsure how well she will do, but she wants to try. We can evaluate  if she will benefit from inhaler therapy. I will see her after PFT's to review the results. She knows to call if she needs us .      Test Results: 07/26/2024 CT Chest without Contrast No right apical mass is seen. 2. Chronic interstitial lung disease with an upper zonal predominance. 3. Multiple pulmonary nodules. Most significant: 7 mm right lower lobe fissural nodule. This would ordinarily trigger a recommendation for noncontrast chest CT follow-up at 3-6 months. This should only be done however, if clinically warranted taking into account her advanced age and life expectancy. 4. Aortic and coronary artery atherosclerosis. 5. Calcifications and thickening of the aortic valve leaflets. Echocardiography may be helpful. 6. Osteopenia and degenerative change.   Aortic Atherosclerosis (ICD10-I70.0). 06/14/2024 CXR Possible 3 cm right apical lung mass. Correlation with CT chest recommended if the patient would be a therapy candidate should neoplasm be detected. Emphysema.    Latest Ref Rng & Units 09/18/2023    9:29 AM 08/18/2023   11:52 AM 09/28/2016    5:54 AM  CBC  WBC 4.0 - 10.5 K/uL 4.7  4.5    Hemoglobin 12.0 - 15.0 g/dL 89.7  89.5  9.2   Hematocrit 36.0 - 46.0 % 32.3  32.0  28.3   Platelets 150 - 400 K/uL 269  212         Latest Ref Rng & Units 06/24/2024   12:05 PM 06/14/2024  4:31 PM 06/03/2024   11:28 AM  BMP  Glucose 70 - 99 mg/dL 849  95  833   BUN 10 - 36 mg/dL 43  61  42   Creatinine 0.57 - 1.00 mg/dL 8.49  7.95  8.18   BUN/Creat Ratio 12 - 28 29  30  23    Sodium 134 - 144 mmol/L 136  138  134   Potassium 3.5 - 5.2 mmol/L 4.9  4.3  4.6   Chloride 96 - 106 mmol/L 97  94  92   CO2 20 - 29 mmol/L 24  24  23    Calcium 8.7 - 10.3 mg/dL 9.4  9.4  9.2     BNP    Component Value Date/Time   BNP 148.0 (H) 06/03/2024 1128    ProBNP    Component Value Date/Time   PROBNP 1,988 (H) 06/03/2024 1127    PFT No results found for: FEV1PRE, FEV1POST, FVCPRE,  FVCPOST, TLC, DLCOUNC, PREFEV1FVCRT, PSTFEV1FVCRT  CT Chest Wo Contrast Result Date: 07/30/2024 CLINICAL DATA:  Last chest x-ray detected possible 3 cm right apical lung mass in a 89 year old female. EXAM: CT CHEST WITHOUT CONTRAST TECHNIQUE: Multidetector CT imaging of the chest was performed following the standard protocol without IV contrast. RADIATION DOSE REDUCTION: This exam was performed according to the departmental dose-optimization program which includes automated exposure control, adjustment of the mA and/or kV according to patient size and/or use of iterative reconstruction technique. COMPARISON:  Left chest x-ray was PA and lateral 06/14/2024, before that PA Lat 05/27/2008. No prior cross-sectional imaging for comparison FINDINGS: Cardiovascular: The pulmonary arteries and veins are normal in caliber. The heart is slightly enlarged. The coronary arteries are heavily calcified. There are moderate to heavy calcific plaques in the aorta and great vessels without aneurysm. There are calcifications and thickening of the aortic valve leaflets. No pericardial effusion. Mediastinum/Nodes: Thyroid gland is obscured by metal spray artifact from a reverse right shoulder arthroplasty. Axillary spaces are clear. No intrathoracic adenopathy is seen without contrast. There are borderline prominent subcarinal and precarinal lymph, largest 9 mm short axis. Negative thoracic trachea, both main bronchi, thoracic esophagus. Lungs/Pleura: There are biapical pleural-parenchymal scarring changes and subpleural reticulation and bronchiolectasis left-greater-than-right the apices. Subpleural reticulation continues into the more inferior upper lobes, and seen to a lesser extent in the lower and right middle lobes, with bronchiolectasis over portions. Findings consistent with chronic interstitial lung disease. There is no associated central bronchiectasis or bronchial plugging. Unclear what caused the radiographic  appearance of a right apical mass as no right apical mass is seen. There is a 7 mm solid ovoid fissural nodule in the right lower lobe on 8:70. There are additional scattered tiny nodules including nodules measuring 3 mm both on image 23 in the right and left lung apices, 3 mm left apical nodule on 8:29, 4 mm left upper lobe nodule on 8:35, and several scattered bilateral additional 2-3 mm nodules in the lower lung fields. There are no spiculated or suspicious nodules.  No pleural effusion. Upper Abdomen: No acute abnormality. Abdominal aortic atherosclerosis. Musculoskeletal: Osteopenia with advanced degenerative change of the spine. No acute or significant osseous findings. Right shoulder reverse arthroplasty, left shoulder DJD and postsurgical change in the left humeral head. No chest wall mass. IMPRESSION: 1. No right apical mass is seen. 2. Chronic interstitial lung disease with an upper zonal predominance. 3. Multiple pulmonary nodules. Most significant: 7 mm right lower lobe fissural nodule. This would ordinarily trigger a recommendation for  noncontrast chest CT follow-up at 3-6 months. This should only be done however, if clinically warranted taking into account her advanced age and life expectancy. 4. Aortic and coronary artery atherosclerosis. 5. Calcifications and thickening of the aortic valve leaflets. Echocardiography may be helpful. 6. Osteopenia and degenerative change. Aortic Atherosclerosis (ICD10-I70.0). Electronically Signed   By: Francis Quam M.D.   On: 07/30/2024 07:49     Past medical hx Past Medical History:  Diagnosis Date   Aortic stenosis    Moderate by echo 05/2024   Arthritis    Chronic kidney disease, stage 3b (HCC)    GERD (gastroesophageal reflux disease)    occ tums   Heart murmur    High cholesterol    Hyperlipemia    Hypertension    Osteoarthritis    Sleep disturbance    Vitamin D  deficiency      Social History   Tobacco Use   Smoking status: Never    Smokeless tobacco: Never  Substance Use Topics   Alcohol use: No   Drug use: No    Ms.Gleed reports that she has never smoked. She has never used smokeless tobacco. She reports that she does not drink alcohol and does not use drugs.  Tobacco Cessation: Counseling given: Not Answered Never smoker, but very big second hand smoke exposure most of her life.  Past surgical hx, Family hx, Social hx all reviewed.  Current Outpatient Medications on File Prior to Visit  Medication Sig   amLODipine  (NORVASC ) 5 MG tablet Take 1 tablet (5 mg total) by mouth daily.   cholecalciferol  (VITAMIN D ) 1000 units tablet Take 5,000 Units by mouth once a week.   FeFum-FePoly-FA-B Cmp-C-Biot (INTEGRA PLUS ) CAPS Take 1 capsule by mouth daily.   Multiple Vitamin (MULTIVITAMIN WITH MINERALS) TABS tablet Take 1 tablet by mouth daily.   mupirocin  ointment (BACTROBAN ) 2 % Apply 1 Application topically 2 (two) times daily.   simvastatin  (ZOCOR ) 20 MG tablet Take 20 mg by mouth daily.   trolamine salicylate (ASPERCREME) 10 % cream Apply 1 application topically daily as needed for muscle pain.   valsartan  (DIOVAN ) 320 MG tablet Take 1 tablet (320 mg total) by mouth daily.   No current facility-administered medications on file prior to visit.     Allergies  Allergen Reactions   Codeine Other (See Comments)    Insomnia     Review Of Systems:  Constitutional:   +  weight loss, No night sweats,  Fevers, chills, fatigue, or  lassitude.  HEENT:   No headaches,  Difficulty swallowing,  Tooth/dental problems, or  Sore throat,                No sneezing, itching, ear ache, nasal congestion, post nasal drip,   CV:  No chest pain,  Orthopnea, PND, swelling in lower extremities, anasarca, dizziness, palpitations, syncope.   GI  No heartburn, indigestion, abdominal pain, nausea, vomiting, diarrhea, change in bowel habits, loss of appetite, bloody stools.   Resp: + shortness of breath with exertion less at rest.  No  excess mucus, no productive cough,  No non-productive cough,  No coughing up of blood.  No change in color of mucus.  No wheezing.  No chest wall deformity  Skin: no rash or lesions.  GU: no dysuria, change in color of urine, no urgency or frequency.  No flank pain, no hematuria   MS:  No joint pain or swelling.  No decreased range of motion.  No back pain.  Psych:  No change in mood or affect. No depression or anxiety.  No memory loss.   Vital Signs BP (!) 173/67   Pulse 76   Temp 97.9 F (36.6 C) (Oral)   Ht 5' (1.524 m)   Wt 101 lb 9.6 oz (46.1 kg)   SpO2 98%   BMI 19.84 kg/m    Physical Exam:  General- No distress,  A&Ox3, pleasant frail female, HOH ENT: No sinus tenderness, TM clear, pale nasal mucosa, no oral exudate,no post nasal drip, no LAN Cardiac: S1, S2, regular rate and rhythm, no murmur Chest: No wheeze/ rales/ dullness; no accessory muscle use, no nasal flaring, no sternal retractions Abd.: Soft Non-tender,  ND, BS +, Body mass index is 19.84 kg/m.  Ext: No clubbing cyanosis, edema, 1+ LE edema, no obvious deformities Neuro:  normal strength, MAE x 4, A&O x 3, appropriate Skin: No rashes, warm and dry, no obvious skin lesions  Psych: normal mood and behavior  Assessment & Plan Pulmonary Nodules Never smoker  Dyspnea without hypoxemia Chronic ILD Plan Your scan shows several lung nodules, the largest of which is 7 mm.  We will do a 6 month follow up Ct Chest to monitor this for growth. This will be due in April 2026. They will call to get this scheduled closer to the time.  This scan also shows that you have chronic interstitial lung disease. This is a disease that causes scarring in the lungs, which leads to shortness of breath. We will do Pulmonary Function tests to check lung function. You will get a call to get these scheduled. We can consider having you follow up with one of the doctors who specialize in this disease after we review your PFT's.   Call if you need us .  Please contact office for sooner follow up if symptoms do not improve or worsen or seek emergency care     I spent 25 minutes dedicated to the care of this patient on the date of this encounter to include pre-visit review of records, face-to-face time with the patient discussing conditions above, post visit ordering of testing, clinical documentation with the electronic health record, making appropriate referrals as documented, and communicating necessary information to the patient's healthcare team.     Victoria JULIANNA Lites, NP 08/04/2024  9:39 AM

## 2024-08-04 NOTE — Patient Instructions (Addendum)
 It is good to see you today. Your scan shows several lung nodules, the largest of which is 7 mm.  We will do a 6 month follow up Ct Chest to monitor this for growth. This will be due in April 2026. They will call to get this scheduled closer to the time.  This scan also shows that you have chronic interstitial lung disease. This is a disease that causes scarring in the lungs, which leads to shortness of breath. We will do Pulmonary Function tests to check lung function. You will get a call to get these scheduled. We can consider having you follow up with one of the doctors who specialize in this disease after we review your PFT's.  Call if you need us .  Please contact office for sooner follow up if symptoms do not improve or worsen or seek emergency care

## 2024-08-11 ENCOUNTER — Ambulatory Visit: Admitting: Acute Care

## 2024-08-25 ENCOUNTER — Ambulatory Visit

## 2024-08-25 DIAGNOSIS — R918 Other nonspecific abnormal finding of lung field: Secondary | ICD-10-CM

## 2024-08-25 DIAGNOSIS — R0609 Other forms of dyspnea: Secondary | ICD-10-CM

## 2024-08-25 LAB — PULMONARY FUNCTION TEST
DL/VA: 2.55 ml/min/mmHg/L
DLCO unc: 8.06 ml/min/mmHg
FEF 25-75 Post: 2.47 L/s
FEF 25-75 Pre: 1.46 L/s
FEF2575-%Change-Post: 69 %
FEF2575-%Pred-Post: 657 %
FEF2575-%Pred-Pre: 388 %
FEV1-%Change-Post: 20 %
FEV1-%Pred-Post: 139 %
FEV1-%Pred-Pre: 115 %
FEV1-Post: 1.41 L
FEV1-Pre: 1.17 L
FEV1FVC-%Change-Post: 1 %
FEV1FVC-%Pred-Pre: 133 %
FEV6-%Change-Post: 18 %
FEV6-%Pred-Post: 114 %
FEV6-%Pred-Pre: 96 %
FEV6-Post: 1.47 L
FEV6-Pre: 1.24 L
FEV6FVC-%Pred-Post: 110 %
FEV6FVC-%Pred-Pre: 110 %
FVC-%Change-Post: 18 %
FVC-%Pred-Post: 103 %
FVC-%Pred-Pre: 87 %
FVC-Post: 1.47 L
FVC-Pre: 1.24 L
Post FEV1/FVC ratio: 96 %
Post FEV6/FVC ratio: 100 %
Pre FEV1/FVC ratio: 95 %
Pre FEV6/FVC Ratio: 100 %
RV % pred: 85 %
RV: 2.12 L
TLC % pred: 91 %
TLC: 4.06 L

## 2024-08-25 NOTE — Progress Notes (Signed)
 Full pft performed today

## 2024-08-25 NOTE — Patient Instructions (Signed)
 Full pft performed today

## 2024-08-27 ENCOUNTER — Encounter: Payer: Self-pay | Admitting: Acute Care

## 2024-08-27 ENCOUNTER — Ambulatory Visit (INDEPENDENT_AMBULATORY_CARE_PROVIDER_SITE_OTHER): Admitting: Acute Care

## 2024-08-27 VITALS — BP 175/69 | HR 71 | Temp 97.8°F | Ht 60.0 in | Wt 101.0 lb

## 2024-08-27 DIAGNOSIS — J849 Interstitial pulmonary disease, unspecified: Secondary | ICD-10-CM

## 2024-08-27 DIAGNOSIS — R911 Solitary pulmonary nodule: Secondary | ICD-10-CM

## 2024-08-27 DIAGNOSIS — R0609 Other forms of dyspnea: Secondary | ICD-10-CM

## 2024-08-27 DIAGNOSIS — R942 Abnormal results of pulmonary function studies: Secondary | ICD-10-CM | POA: Diagnosis not present

## 2024-08-27 DIAGNOSIS — R9389 Abnormal findings on diagnostic imaging of other specified body structures: Secondary | ICD-10-CM | POA: Diagnosis not present

## 2024-08-27 MED ORDER — AEROCHAMBER PLUS FLO-VU MISC: 0 refills | Status: DC

## 2024-08-27 MED ORDER — ALBUTEROL SULFATE HFA 108 (90 BASE) MCG/ACT IN AERS: 1.0000 | INHALATION_SPRAY | Freq: Four times a day (QID) | RESPIRATORY_TRACT | 2 refills | Status: DC | PRN

## 2024-08-27 NOTE — Progress Notes (Signed)
 History of Present Illness [name] is a 89 y.o. female  never smoker referred to see Dr. Shelah for abnormal chest imaging .   08/27/2024 Discussed the use of AI scribe software for clinical note transcription with the patient, who gave verbal consent to proceed.  History of Present Illness [name] is a 89 year old female who presents for follow up after PFT's for shortness of breath in setting of chronic interstitial lung disease with an upper zonal predominance. She is also being followed for a right apical lung mass ( which had resolved on last CT Chest)  , and multiple pulmonary nodules that will require follow up imaging..   She experiences shortness of breath, which has been evaluated with pulmonary function tests showing some restriction.  We have reviewed her PFT's, which show restriction, most likely explained by her chronic ILD. She had significant BD response during her PFT's.Plan will be for albuterol as needed for shortness of breath on exertion. We will also prescribe a spacer as patient may find timing of the inhaler to breath difficult. This will allow her more time to inhale medication.   She will have her follow up Ct Chest 01/2025, and she will follow up in the office to review results 1-2 weeks after the scan is completed.      Test Results:PFT 08/25/2024          07/26/2024 CT Chest without Contrast No right apical mass is seen. 2. Chronic interstitial lung disease with an upper zonal predominance. 3. Multiple pulmonary nodules. Most significant: 7 mm right lower lobe fissural nodule. This would ordinarily trigger a recommendation for noncontrast chest CT follow-up at 3-6 months. This should only be done however, if clinically warranted taking into account her advanced age and life expectancy. 4. Aortic and coronary artery atherosclerosis. 5. Calcifications and thickening of the aortic valve leaflets. Echocardiography may be helpful. 6.  Osteopenia and degenerative change.   Aortic Atherosclerosis (ICD10-I70.0).   06/14/2024 CXR Possible 3 cm right apical lung mass. Correlation with CT chest recommended if the patient would be a therapy candidate should neoplasm be detected. Emphysema.     Latest Ref Rng & Units 09/18/2023    9:29 AM 08/18/2023   11:52 AM 09/28/2016    5:54 AM  CBC  WBC 4.0 - 10.5 K/uL 4.7  4.5    Hemoglobin 12.0 - 15.0 g/dL 89.7  89.5  9.2   Hematocrit 36.0 - 46.0 % 32.3  32.0  28.3   Platelets 150 - 400 K/uL 269  212         Latest Ref Rng & Units 06/24/2024   12:05 PM 06/14/2024    4:31 PM 06/03/2024   11:28 AM  BMP  Glucose 70 - 99 mg/dL 849  95  833   BUN 10 - 36 mg/dL 43  61  42   Creatinine 0.57 - 1.00 mg/dL 8.49  7.95  8.18   BUN/Creat Ratio 12 - 28 29  30  23    Sodium 134 - 144 mmol/L 136  138  134   Potassium 3.5 - 5.2 mmol/L 4.9  4.3  4.6   Chloride 96 - 106 mmol/L 97  94  92   CO2 20 - 29 mmol/L 24  24  23    Calcium 8.7 - 10.3 mg/dL 9.4  9.4  9.2     BNP    Component Value Date/Time   BNP 148.0 (H) 06/03/2024 1128    ProBNP  Component Value Date/Time   PROBNP 1,988 (H) 06/03/2024 1127    PFT    Component Value Date/Time   FEV1PRE 1.17 08/25/2024 1558   FEV1POST 1.41 08/25/2024 1558   FVCPRE 1.24 08/25/2024 1558   FVCPOST 1.47 08/25/2024 1558   TLC 4.06 08/25/2024 1558   DLCOUNC 8.06 08/25/2024 1558   PREFEV1FVCRT 95 08/25/2024 1558   PSTFEV1FVCRT 96 08/25/2024 1558    No results found.   Past medical hx Past Medical History:  Diagnosis Date   Aortic stenosis    Moderate by echo 05/2024   Arthritis    Chronic kidney disease, stage 3b (HCC)    GERD (gastroesophageal reflux disease)    occ tums   Heart murmur    High cholesterol    Hyperlipemia    Hypertension    Osteoarthritis    Sleep disturbance    Vitamin D  deficiency      Social History   Tobacco Use   Smoking status: Never    Passive exposure: Past   Smokeless tobacco: Never   Substance Use Topics   Alcohol use: No   Drug use: No    [name] reports that she has never smoked. She has been exposed to tobacco smoke. She has never used smokeless tobacco. She reports that she does not drink alcohol and does not use drugs.  Tobacco Cessation: Counseling given: Not Answered Never smoker  Past surgical hx, Family hx, Social hx all reviewed.  Current Outpatient Medications on File Prior to Visit  Medication Sig   amLODipine  (NORVASC ) 5 MG tablet Take 1 tablet (5 mg total) by mouth daily.   cholecalciferol  (VITAMIN D ) 1000 units tablet Take 5,000 Units by mouth once a week.   FeFum-FePoly-FA-B Cmp-C-Biot (INTEGRA PLUS ) CAPS Take 1 capsule by mouth daily.   Multiple Vitamin (MULTIVITAMIN WITH MINERALS) TABS tablet Take 1 tablet by mouth daily.   mupirocin  ointment (BACTROBAN ) 2 % Apply 1 Application topically 2 (two) times daily.   simvastatin  (ZOCOR ) 20 MG tablet Take 20 mg by mouth daily.   trolamine salicylate (ASPERCREME) 10 % cream Apply 1 application topically daily as needed for muscle pain.   valsartan  (DIOVAN ) 320 MG tablet Take 1 tablet (320 mg total) by mouth daily.   No current facility-administered medications on file prior to visit.     Allergies  Allergen Reactions   Codeine Other (See Comments)    Insomnia     Review Of Systems:  Constitutional:   No  weight loss, night sweats,  Fevers, chills, fatigue, or  lassitude.  HEENT:   No headaches,  Difficulty swallowing,  Tooth/dental problems, or  Sore throat,                No sneezing, itching, ear ache, nasal congestion, post nasal drip,   CV:  No chest pain,  Orthopnea, PND, swelling in lower extremities, anasarca, dizziness, palpitations, syncope.   GI  No heartburn, indigestion, abdominal pain, nausea, vomiting, diarrhea, change in bowel habits, loss of appetite, bloody stools.   Resp: + shortness of breath with exertion less at rest.  No excess mucus, no productive cough,  No  non-productive cough,  No coughing up of blood.  No change in color of mucus.  No wheezing.  No chest wall deformity  Skin: no rash or lesions.  GU: no dysuria, change in color of urine, no urgency or frequency.  No flank pain, no hematuria   MS:  No joint pain or swelling.  No decreased range of  motion.  No back pain.  Psych:  No change in mood or affect. No depression or anxiety.  No memory loss.   Vital Signs BP (!) 175/69   Pulse 71   Temp 97.8 F (36.6 C) (Oral)   Ht 5' (1.524 m)   Wt 101 lb (45.8 kg)   SpO2 98%   BMI 19.73 kg/m     Physical Exam GENERAL: No distress, alert and oriented times 3. EARS NOSE THROAT: No sinus tenderness, tympanic membranes clear, pale nasal mucosa, no oral exudate, no post nasal drip, no lymphadenopathy. CHEST: No wheeze, rales, dullness, no accessory muscle use, no nasal flaring, no sternal retractions. CARDIAC: S1, S2, regular rate and rhythm, no murmur. ABDOMINAL: Soft, non tender. ND, BS present, EXTREMITIES: No clubbing, cyanosis, edema. No obvious deformities NEUROLOGICAL: Physical deconditioning,  Alert and oriented x 3, MAE x 4, in a wheelchair, but does walk at home SKIN: No rashes, warm and dry. No obvious skin lesions PSYCHIATRIC: Normal mood and behavior.   Assessment/Plan  Assessment and Plan Assessment & Plan Dyspnea on exertion Pulmonary function tests show restriction, likely from weakness or deconditioning and known interstitial lung disease. Significant  bronchodilator response.  Plan - Prescribed albuterol inhaler, 1-2 puffs as needed, up to every 6 hours, for exertional dyspnea.  Sent prescription to [facility] at patient request. - Ordered spacer for inhaler use. - Instructed on using YouTube for spacer instruction videos. - Watched video with caregiver to ensure she understood how to use spacer.  I spent 20 minutes dedicated to the care of this patient on the date of this encounter to include pre-visit  review of records, face-to-face time with the patient discussing conditions above, post visit ordering of testing, clinical documentation with the electronic health record, making appropriate referrals as documented, and communicating necessary information to the patient's healthcare team.      Lauraine JULIANNA Lites, NP 08/27/2024  11:36 AM

## 2024-08-27 NOTE — Patient Instructions (Addendum)
 It is good to see you today.  Your Pulmonary Function tests show some restrictive disease that may be related to your physical deconditioning and your interstitial lung disease. They do show that you have a good response to albuterol which is a rescue inhaler.  We will send in an albuterol inhaler. Use 1-2 puffs as needed for shortness of breath or wheezing . Do not use more than every 6 hours. I will also order a spacer to use with the inhaler, as it is sometimes difficult to coordinate you puffs with breaths. Please use the You Tube video to help with instructions for  use. Follow up Ct Chest April 2026 as is ordered. You will get a call to schedule this closer to the time it is due.  Please contact office for sooner follow up if symptoms do not improve or worsen or seek emergency care

## 2024-10-19 ENCOUNTER — Encounter: Payer: Self-pay | Admitting: Podiatry

## 2024-10-19 ENCOUNTER — Ambulatory Visit: Admitting: Podiatry

## 2024-10-19 DIAGNOSIS — M79671 Pain in right foot: Secondary | ICD-10-CM

## 2024-10-19 DIAGNOSIS — B351 Tinea unguium: Secondary | ICD-10-CM | POA: Diagnosis not present

## 2024-10-19 DIAGNOSIS — M79672 Pain in left foot: Secondary | ICD-10-CM | POA: Diagnosis not present

## 2024-10-19 NOTE — Progress Notes (Signed)
 Patient presents for evaluation and treatment of tenderness and some redness around nails feet.  Tenderness around toes with walking and wearing shoes.  Physical exam:  General appearance: Alert, pleasant, and in no acute distress.  Vascular: Pedal pulses: DP 2/4 B/L, PT 0/4 B/L. Mild edema lower legs bilaterally  Neu  Dermatologic:  Nails thickened, disfigured, discolored 1-5 BL with subungual debris.  Redness and hypertrophic nail folds along nail folds bilaterally but no signs of drainage or infection.  Musculoskeletal:     Diagnosis: 1. Painful onychomycotic nails 1 through 5 bilaterally. 2. Pain toes 1 through 5 bilaterally.  Plan: -Debrided onychomycotic nails 1 through 5 bilaterally.  Sharply debrided nails with nail clipper and reduced with a power bur.  Return 3 months RFC

## 2024-10-31 ENCOUNTER — Other Ambulatory Visit: Payer: Self-pay

## 2024-10-31 ENCOUNTER — Emergency Department (HOSPITAL_COMMUNITY)

## 2024-10-31 ENCOUNTER — Inpatient Hospital Stay (HOSPITAL_COMMUNITY)
Admission: EM | Admit: 2024-10-31 | Discharge: 2024-11-08 | DRG: 291 | Disposition: A | Discharge status: Skilled Nursing Facility | Attending: Student | Admitting: Student

## 2024-10-31 ENCOUNTER — Encounter (HOSPITAL_COMMUNITY): Payer: Self-pay

## 2024-10-31 DIAGNOSIS — D631 Anemia in chronic kidney disease: Secondary | ICD-10-CM | POA: Diagnosis present

## 2024-10-31 DIAGNOSIS — E871 Hypo-osmolality and hyponatremia: Secondary | ICD-10-CM | POA: Diagnosis present

## 2024-10-31 DIAGNOSIS — I5082 Biventricular heart failure: Secondary | ICD-10-CM | POA: Diagnosis present

## 2024-10-31 DIAGNOSIS — I2489 Other forms of acute ischemic heart disease: Secondary | ICD-10-CM | POA: Diagnosis present

## 2024-10-31 DIAGNOSIS — G3184 Mild cognitive impairment, so stated: Secondary | ICD-10-CM | POA: Diagnosis not present

## 2024-10-31 DIAGNOSIS — Z515 Encounter for palliative care: Secondary | ICD-10-CM | POA: Diagnosis not present

## 2024-10-31 DIAGNOSIS — I480 Paroxysmal atrial fibrillation: Secondary | ICD-10-CM | POA: Diagnosis not present

## 2024-10-31 DIAGNOSIS — Z79890 Hormone replacement therapy (postmenopausal): Secondary | ICD-10-CM

## 2024-10-31 DIAGNOSIS — R627 Adult failure to thrive: Secondary | ICD-10-CM | POA: Diagnosis present

## 2024-10-31 DIAGNOSIS — E7800 Pure hypercholesterolemia, unspecified: Secondary | ICD-10-CM | POA: Diagnosis present

## 2024-10-31 DIAGNOSIS — E785 Hyperlipidemia, unspecified: Secondary | ICD-10-CM | POA: Diagnosis present

## 2024-10-31 DIAGNOSIS — I50813 Acute on chronic right heart failure: Principal | ICD-10-CM

## 2024-10-31 DIAGNOSIS — I5031 Acute diastolic (congestive) heart failure: Secondary | ICD-10-CM

## 2024-10-31 DIAGNOSIS — Z96611 Presence of right artificial shoulder joint: Secondary | ICD-10-CM | POA: Diagnosis present

## 2024-10-31 DIAGNOSIS — I3139 Other pericardial effusion (noninflammatory): Secondary | ICD-10-CM | POA: Diagnosis present

## 2024-10-31 DIAGNOSIS — L89112 Pressure ulcer of right upper back, stage 2: Secondary | ICD-10-CM | POA: Diagnosis present

## 2024-10-31 DIAGNOSIS — J849 Interstitial pulmonary disease, unspecified: Secondary | ICD-10-CM | POA: Diagnosis present

## 2024-10-31 DIAGNOSIS — R451 Restlessness and agitation: Secondary | ICD-10-CM | POA: Diagnosis not present

## 2024-10-31 DIAGNOSIS — E039 Hypothyroidism, unspecified: Secondary | ICD-10-CM | POA: Diagnosis present

## 2024-10-31 DIAGNOSIS — R296 Repeated falls: Secondary | ICD-10-CM | POA: Diagnosis present

## 2024-10-31 DIAGNOSIS — I5033 Acute on chronic diastolic (congestive) heart failure: Secondary | ICD-10-CM | POA: Diagnosis present

## 2024-10-31 DIAGNOSIS — R7989 Other specified abnormal findings of blood chemistry: Secondary | ICD-10-CM | POA: Diagnosis not present

## 2024-10-31 DIAGNOSIS — K219 Gastro-esophageal reflux disease without esophagitis: Secondary | ICD-10-CM | POA: Diagnosis present

## 2024-10-31 DIAGNOSIS — N1832 Chronic kidney disease, stage 3b: Secondary | ICD-10-CM | POA: Diagnosis present

## 2024-10-31 DIAGNOSIS — I350 Nonrheumatic aortic (valve) stenosis: Secondary | ICD-10-CM | POA: Diagnosis present

## 2024-10-31 DIAGNOSIS — J9601 Acute respiratory failure with hypoxia: Secondary | ICD-10-CM | POA: Diagnosis present

## 2024-10-31 DIAGNOSIS — E559 Vitamin D deficiency, unspecified: Secondary | ICD-10-CM | POA: Diagnosis present

## 2024-10-31 DIAGNOSIS — I10 Essential (primary) hypertension: Secondary | ICD-10-CM | POA: Insufficient documentation

## 2024-10-31 DIAGNOSIS — Z79899 Other long term (current) drug therapy: Secondary | ICD-10-CM

## 2024-10-31 DIAGNOSIS — Z7189 Other specified counseling: Secondary | ICD-10-CM | POA: Diagnosis not present

## 2024-10-31 DIAGNOSIS — R54 Age-related physical debility: Secondary | ICD-10-CM | POA: Diagnosis present

## 2024-10-31 DIAGNOSIS — N183 Chronic kidney disease, stage 3 unspecified: Secondary | ICD-10-CM | POA: Insufficient documentation

## 2024-10-31 DIAGNOSIS — I130 Hypertensive heart and chronic kidney disease with heart failure and stage 1 through stage 4 chronic kidney disease, or unspecified chronic kidney disease: Secondary | ICD-10-CM | POA: Diagnosis present

## 2024-10-31 DIAGNOSIS — Z66 Do not resuscitate: Secondary | ICD-10-CM | POA: Diagnosis present

## 2024-10-31 DIAGNOSIS — D649 Anemia, unspecified: Secondary | ICD-10-CM | POA: Diagnosis present

## 2024-10-31 DIAGNOSIS — I4891 Unspecified atrial fibrillation: Secondary | ICD-10-CM | POA: Diagnosis present

## 2024-10-31 DIAGNOSIS — I509 Heart failure, unspecified: Secondary | ICD-10-CM

## 2024-10-31 DIAGNOSIS — Z1330 Encounter for screening examination for mental health and behavioral disorders, unspecified: Secondary | ICD-10-CM | POA: Diagnosis not present

## 2024-10-31 DIAGNOSIS — J984 Other disorders of lung: Secondary | ICD-10-CM | POA: Diagnosis present

## 2024-10-31 DIAGNOSIS — R0602 Shortness of breath: Secondary | ICD-10-CM | POA: Diagnosis present

## 2024-10-31 LAB — COMPREHENSIVE METABOLIC PANEL WITH GFR
ALT: 20 U/L (ref 0–44)
AST: 35 U/L (ref 15–41)
Albumin: 4.1 g/dL (ref 3.5–5.0)
Alkaline Phosphatase: 78 U/L (ref 38–126)
Anion gap: 12 (ref 5–15)
BUN: 22 mg/dL (ref 8–23)
CO2: 23 mmol/L (ref 22–32)
Calcium: 9 mg/dL (ref 8.9–10.3)
Chloride: 99 mmol/L (ref 98–111)
Creatinine, Ser: 1.26 mg/dL — ABNORMAL HIGH (ref 0.44–1.00)
GFR, Estimated: 39 mL/min — ABNORMAL LOW
Glucose, Bld: 113 mg/dL — ABNORMAL HIGH (ref 70–99)
Potassium: 4.2 mmol/L (ref 3.5–5.1)
Sodium: 134 mmol/L — ABNORMAL LOW (ref 135–145)
Total Bilirubin: 0.4 mg/dL (ref 0.0–1.2)
Total Protein: 7.1 g/dL (ref 6.5–8.1)

## 2024-10-31 LAB — CBC WITH DIFFERENTIAL/PLATELET
Abs Immature Granulocytes: 0.01 K/uL (ref 0.00–0.07)
Basophils Absolute: 0 K/uL (ref 0.0–0.1)
Basophils Relative: 0 %
Eosinophils Absolute: 0.1 K/uL (ref 0.0–0.5)
Eosinophils Relative: 1 %
HCT: 36.5 % (ref 36.0–46.0)
Hemoglobin: 11.7 g/dL — ABNORMAL LOW (ref 12.0–15.0)
Immature Granulocytes: 0 %
Lymphocytes Relative: 21 %
Lymphs Abs: 1.4 K/uL (ref 0.7–4.0)
MCH: 29.3 pg (ref 26.0–34.0)
MCHC: 32.1 g/dL (ref 30.0–36.0)
MCV: 91.5 fL (ref 80.0–100.0)
Monocytes Absolute: 0.4 K/uL (ref 0.1–1.0)
Monocytes Relative: 7 %
Neutro Abs: 4.8 K/uL (ref 1.7–7.7)
Neutrophils Relative %: 71 %
Platelets: 316 K/uL (ref 150–400)
RBC: 3.99 MIL/uL (ref 3.87–5.11)
RDW: 15.1 % (ref 11.5–15.5)
WBC: 6.8 K/uL (ref 4.0–10.5)
nRBC: 0 % (ref 0.0–0.2)

## 2024-10-31 LAB — URINALYSIS, W/ REFLEX TO CULTURE (INFECTION SUSPECTED)
Bilirubin Urine: NEGATIVE
Glucose, UA: NEGATIVE mg/dL
Hgb urine dipstick: NEGATIVE
Ketones, ur: NEGATIVE mg/dL
Leukocytes,Ua: NEGATIVE
Nitrite: NEGATIVE
Protein, ur: >=300 mg/dL — AB
Specific Gravity, Urine: 1.012 (ref 1.005–1.030)
pH: 5 (ref 5.0–8.0)

## 2024-10-31 LAB — TROPONIN T, HIGH SENSITIVITY
Troponin T High Sensitivity: 116 ng/L (ref 0–19)
Troponin T High Sensitivity: 106 ng/L (ref 0–19)

## 2024-10-31 LAB — PRO BRAIN NATRIURETIC PEPTIDE: Pro Brain Natriuretic Peptide: 10738 pg/mL — ABNORMAL HIGH

## 2024-10-31 MED ORDER — HEPARIN (PORCINE) 25000 UT/250ML-% IV SOLN
550.0000 [IU]/h | INTRAVENOUS | Status: DC
  Administered 2024-10-31: 550 [IU]/h via INTRAVENOUS
  Filled 2024-10-31: qty 250

## 2024-10-31 MED ORDER — AMLODIPINE BESYLATE 5 MG PO TABS
5.0000 mg | ORAL_TABLET | Freq: Every day | ORAL | Status: DC
  Administered 2024-10-31 – 2024-11-01 (×2): 5 mg via ORAL
  Filled 2024-10-31 (×2): qty 1

## 2024-10-31 MED ORDER — IRBESARTAN 300 MG PO TABS
300.0000 mg | ORAL_TABLET | Freq: Every day | ORAL | Status: DC
  Administered 2024-11-01 – 2024-11-02 (×2): 300 mg via ORAL
  Filled 2024-10-31 (×2): qty 1

## 2024-10-31 MED ORDER — HEPARIN BOLUS VIA INFUSION
1000.0000 [IU] | Freq: Once | INTRAVENOUS | Status: AC
  Administered 2024-10-31: 1000 [IU] via INTRAVENOUS
  Filled 2024-10-31: qty 1000

## 2024-10-31 MED ORDER — FUROSEMIDE 10 MG/ML IJ SOLN
20.0000 mg | Freq: Once | INTRAMUSCULAR | Status: AC
  Administered 2024-10-31: 20 mg via INTRAVENOUS
  Filled 2024-10-31: qty 4

## 2024-10-31 MED ORDER — ACETAMINOPHEN 650 MG RE SUPP: 650.0000 mg | Freq: Four times a day (QID) | RECTAL | Status: DC | PRN

## 2024-10-31 MED ORDER — SODIUM CHLORIDE 0.9 % IV BOLUS: 500.0000 mL | Freq: Once | INTRAVENOUS | Status: DC

## 2024-10-31 MED ORDER — FUROSEMIDE 10 MG/ML IJ SOLN
20.0000 mg | Freq: Two times a day (BID) | INTRAMUSCULAR | Status: AC
  Administered 2024-11-01 – 2024-11-02 (×4): 20 mg via INTRAVENOUS
  Filled 2024-10-31 (×4): qty 2

## 2024-10-31 MED ORDER — ACETAMINOPHEN 325 MG PO TABS
650.0000 mg | ORAL_TABLET | Freq: Four times a day (QID) | ORAL | Status: DC | PRN
  Administered 2024-11-01 – 2024-11-02 (×3): 650 mg via ORAL
  Filled 2024-10-31 (×3): qty 2

## 2024-10-31 NOTE — ED Provider Notes (Signed)
 " [place] EMERGENCY DEPARTMENT AT [facility] Provider Note   CSN: [id number] Arrival date & time: 10/31/24  1624     Patient presents with: Failure To Thrive   [name] is a 89 y.o. female.   Patient with history of CKD stage IIIb, GERD, hyperlipidemia, hypertension and osteoarthritis. BIB a family friend who notes progressively worsening symptoms over the last 6 weeks. Patient lives alone and friend notes a drop in appetite, not eating any significant amount of food. She has become progressively more SOB, increasing DOE. The patient denies chest pain or abdominal pain. She denies recent fall. Her legs have become more and more swollen. Friend also notes a change in mental status citing memory loss which she hasn't had before. Today, the friend [place]) went to her house and feels she may have fallen because of the appearance of the home but is unsure. No fever reported. Patient denies cough.   The history is provided by the patient and a friend. No language interpreter was used.       Prior to Admission medications  Medication Sig Start Date End Date Taking? Authorizing Provider  albuterol  (VENTOLIN  HFA) 108 (90 Base) MCG/ACT inhaler Inhale 1-2 puffs into the lungs every 6 (six) hours as needed for wheezing or shortness of breath. 08/27/24   Ruthell Lauraine FALCON, NP  amLODipine  (NORVASC ) 5 MG tablet Take 1 tablet (5 mg total) by mouth daily. 05/31/24 10/19/24  Shlomo Wilbert SAUNDERS, MD  cholecalciferol  (VITAMIN D ) 1000 units tablet Take 5,000 Units by mouth once a week.    [provider]  FeFum-FePoly-FA-B Cmp-C-Biot (INTEGRA PLUS ) CAPS Take 1 capsule by mouth daily. 08/18/23   Sherrod Sherrod, MD  Multiple Vitamin (MULTIVITAMIN WITH MINERALS) TABS tablet Take 1 tablet by mouth daily.    [provider]  mupirocin  ointment (BACTROBAN ) 2 % Apply 1 Application topically 2 (two) times daily. 05/28/24   Christine Rush, DPM  simvastatin  (ZOCOR ) 20 MG tablet Take 20 mg  by mouth daily.    [provider]  Spacer/Aero-Holding Chambers (AEROCHAMBER PLUS) Device Use as needed with albuterol  inhaler 08/27/24   Groce, Sarah F, NP  trolamine salicylate (ASPERCREME) 10 % cream Apply 1 application topically daily as needed for muscle pain.    [provider]  valsartan  (DIOVAN ) 320 MG tablet Take 1 tablet (320 mg total) by mouth daily. 05/31/24   Shlomo Wilbert SAUNDERS, MD    Allergies: Codeine    Review of Systems  Updated Vital Signs BP (!) 184/126   Pulse 85   Temp 97.7 F (36.5 C) (Axillary)   Resp (!) 26   Ht 5' 1 (1.549 m)   Wt 47.6 kg   SpO2 96%   BMI 19.84 kg/m   Physical Exam Vitals and nursing note reviewed.  Constitutional:      Appearance: She is not toxic-appearing.     Comments: Frail appearing.  HENT:     Head: Normocephalic and atraumatic.  Eyes:     Extraocular Movements: Extraocular movements intact.     Pupils: Pupils are equal, round, and reactive to light.  Cardiovascular:     Rate and Rhythm: Normal rate and regular rhythm.     Heart sounds: No murmur heard. Pulmonary:     Effort: Pulmonary effort is normal.     Breath sounds: No wheezing, rhonchi or rales.  Abdominal:     General: There is no distension.     Palpations: Abdomen is soft.  Tenderness: There is no abdominal tenderness.  Musculoskeletal:        General: Normal range of motion.     Cervical back: Normal range of motion and neck supple.     Right lower leg: Edema present.     Left lower leg: Edema present.  Skin:    General: Skin is warm and dry.  Neurological:     Mental Status: She is alert and oriented to person, place, and time.     (all labs ordered are listed, but only abnormal results are displayed) Labs Reviewed  CBC WITH DIFFERENTIAL/PLATELET - Abnormal; Notable for the following components:      Result Value   Hemoglobin 11.7 (*)    All other components within normal limits  COMPREHENSIVE METABOLIC PANEL WITH GFR - Abnormal;  Notable for the following components:   Sodium 134 (*)    Glucose, Bld 113 (*)    Creatinine, Ser 1.26 (*)    GFR, Estimated 39 (*)    All other components within normal limits  PRO BRAIN NATRIURETIC PEPTIDE - Abnormal; Notable for the following components:   Pro Brain Natriuretic Peptide 10,738.0 (*)    All other components within normal limits  URINALYSIS, W/ REFLEX TO CULTURE (INFECTION SUSPECTED) - Abnormal; Notable for the following components:   APPearance HAZY (*)    Protein, ur >=300 (*)    Bacteria, UA RARE (*)    All other components within normal limits  TROPONIN T, HIGH SENSITIVITY - Abnormal; Notable for the following components:   Troponin T High Sensitivity 116 (*)    All other components within normal limits  TROPONIN T, HIGH SENSITIVITY    EKG: EKG Interpretation Date/Time:  Sunday October 31 2024 19:13:53 EST Ventricular Rate:  88 PR Interval:    QRS Duration:  107 QT Interval:  416 QTC Calculation: 504 R Axis:   -31  Text Interpretation: Atrial fibrillation Left ventricular hypertrophy Anterior infarct, old Borderline T abnormalities, inferior leads Prolonged QT interval Confirmed by Garrick [place] [id number]) on 10/31/2024 7:28:45 PM  Radiology: CT Head Wo Contrast Result Date: 10/31/2024 EXAM: CT HEAD WITHOUT CONTRAST 10/31/2024 05:41:59 PM TECHNIQUE: CT of the head was performed without the administration of intravenous contrast. Automated exposure control, iterative reconstruction, and/or weight based adjustment of the mA/kV was utilized to reduce the radiation dose to as low as reasonably achievable. COMPARISON: CT head without contrast 05/11/2022. CLINICAL HISTORY: Mental status change, unknown cause. Decreased appetite and increased memory loss. FINDINGS: BRAIN AND VENTRICLES: Moderate chronic microvascular ischemic change. Generalized volume loss without lobar predominance, consistent with moderate atrophy. White matter changes demonstrate slight progression  since the prior exam. No acute hemorrhage. No evidence of acute infarct. No hydrocephalus. No extra-axial collection. No mass effect or midline shift. ORBITS: Bilateral lens replacement noted. SINUSES: Air-fluid level in right sphenoid sinus, compatible with acute sinusitis. SOFT TISSUES AND SKULL: No acute soft tissue abnormality. No skull fracture. IMPRESSION: 1. No acute intracranial abnormality. 2. Moderate chronic microvascular ischemic change and generalized volume loss without lobar predominance, slightly progressed. 3. Right sphenoid sinus air-fluid level, consistent with acute sinusitis. Electronically signed by: Lonni Necessary MD 10/31/2024 06:02 PM EST RP Workstation: [id number]   DG Chest Portable 1 View Result Date: 10/31/2024 CLINICAL DATA:  Shortness of breath.  Bilateral pitting edema. EXAM: PORTABLE CHEST 1 VIEW COMPARISON:  06/14/2024. FINDINGS: Heart is enlarged and the mediastinal contour stable. Atherosclerotic calcification of the aorta is noted. The pulmonary vasculature is distended. Interstitial prominence is noted bilaterally  and increased from the prior exams. There is a small right pleural effusion and moderate left pleural effusion with a associated atelectasis or infiltrate. No pneumothorax is seen. Shoulder arthroplasty changes are noted on the right IMPRESSION: 1. Cardiomegaly with pulmonary vascular congestion. 2. Increased interstitial prominence bilaterally with airspace disease at the lung bases, possible edema or infiltrate. 3. Small right pleural effusion and moderate left pleural effusion. Electronically Signed   By: Leita Birmingham M.D.   On: 10/31/2024 17:35     Procedures   Medications Ordered in the ED  sodium chloride  0.9 % bolus 500 mL (500 mLs Intravenous Not Given 10/31/24 1805)  furosemide  (LASIX ) injection 20 mg (20 mg Intravenous Given 10/31/24 1924)    Clinical Course as of 10/31/24 1957  Sun Oct 31, 2024  1719 Patient BIB friend concerned for decline  over 6 weeks, now significantly SOB, having LE edema, no oxygen requirement with 88% RA saturation increased to 94% on 2 L. Denies pain. Labs, imaging ordered.  [SU]  1726 Chart reviewed. History of CHF, seen by Wilbert Bihari, cardiology, in August 2025, and medications changes were made including decreasing Norvasc  to 2.5, stopping valsartan /hydrochlorothiazide  and starting valsartan  and Lasix . On subequent visit, was doing well on this management.  [SU]  1929 Patient with CHF in acute exacerbation with BNP >10,000, CXR with edema, new oxygen requirement, significant peripheral edema, troponin 116. No pain, no EKG changes of ischemia. EKG does show new onset atrial fibrillation, normal rate. IV lasix  20 mg ordered and given. Will consult cardiology for direction.  [SU]  1952 Discussed with cardiology, Dr. Marty. Advises she can admit to [facility], no need to transfer to [facility]. Decision to anticoagulate can be made with patient, healthcare POA and cardiology while inpatient.  I discussed advanced directives with one of her 3 HC POA, Robbie McLaughlin [phone]) who advises she is a DNR, no intubation.   Discussed with the hospitalist, Dr. Franky, who accepts for admission.  [SU]    Clinical Course User Index [SU] Odell Balls, PA-C                                 Medical Decision Making Amount and/or Complexity of Data Reviewed Labs: ordered. Radiology: ordered.  Risk Prescription drug management.       Final diagnoses:  Acute on chronic right-sided congestive heart failure [facility])  New onset atrial fibrillation [facility])    ED Discharge Orders     None          Odell Balls, PA-C 10/31/24 1957    Kingsley, Victoria K, DO 10/31/24 2039  "

## 2024-10-31 NOTE — Progress Notes (Signed)
 [facility] cardiology brief note  89 year old female with a history of suspected HFpEF, CKD, hypertension, hyperlipidemia, moderate AS who presents with evidence of volume overload and new onset atrial fibrillation.  I was contacted by [facility] emergency room for input into need for cardiology involvement and need for anticoagulation.  She follows with Dr. Shlomo as an outpatient and was recently managed with diuretics for a suspected heart failure exacerbation back in August.  Echo shows preserved EF with grade 1 diastolic dysfunction and moderate AS.  Patient is now presenting with bilateral lower extremity edema and worsening shortness of breath.  She is hemodynamically stable and on nasal cannula.  [facility] team is planning to diurese for suspected heart failure exacerbation.  EKG is showing rate controlled atrial fibrillation.  She does not have a known history of atrial fibrillation.  The atrial fibrillation may be a cause or a symptom of the heart failure exacerbation.   Regarding anticoagulation, her stroke risk is high with a CHA2DS2-VASc of 5.  She does not have a significant bleeding history.  Age is not a contraindication to anticoagulation although she is 89 with frailty and, per [facility] discussion with patient and family, patient is a DNR.  Given these considerations, recommend a risk-benefit discussion with the family regarding anticoagulation before proceeding.  Regarding need for cardiology involvement, I would recommend a cardiology consultation in the morning for assistance with management with her heart failure and to be involved in anticoagulation discussions.

## 2024-10-31 NOTE — ED Notes (Signed)
 Pt to CT

## 2024-10-31 NOTE — H&P (Signed)
 " History and Physical    [name] FMW:[id number] DOB: [birth year] DOA: 10/31/2024  Patient coming from: Home.  Chief Complaint: Shortness of breath.  HPI: [name] is a 89 y.o. female with history of chronic HFpEF, moderate aortic stenosis.  2D echo done in August 2025, hypertension, chronic kidney disease stage III, anemia was brought to the ER after patient's friend who frequently visits the patient found that patient was very short of breath with minimal exertion since she saw her 5 days ago.  Patient does have chronic lower EXTR edema and had followed up with cardiology Dr. Shlomo who had placed her on Lasix  but medication reconciliation does not show if patient is still taking Lasix .  Patient denies any chest pain productive cough fever chills.  Patient's friend noticed that with minimal exertion patient was getting short of breath and took long time to open the door when she went to check on her today.  Does not sure if patient had a fall at home.  Patient's friend also noted that patient has progressively had some cognitive decline.  Patient is also following with pulmonologist for chronic interstitial lung disease with pulmonary function test showing restrictive lung disease which was responsive to albuterol  inhaler (patient's friend states that patient rarely takes the albuterol .  ED Course: In the ER chest x-ray shows right left pleural effusion and small right pleural effusion with proBNP of 10,000 and elevated troponin of 116 which remained flat.  Chest x-ray also shows increased interstitial infiltrates though patient does not have any productive cough or fever or chills or leukocytosis.  EKG was showing A-fib which is new onset rate controlled.  CT head was unremarkable.  Patient was given 20 mg IV Lasix  and admitted for further workup.  ER physician discussed with on-call cardiologist.  Review of Systems: As per HPI, rest all negative.   Past Medical History:  Diagnosis  Date   Aortic stenosis    Moderate by echo 05/2024   Arthritis    Chronic kidney disease, stage 3b (HCC)    GERD (gastroesophageal reflux disease)    occ tums   Heart murmur    High cholesterol    Hyperlipemia    Hypertension    Osteoarthritis    Sleep disturbance    Vitamin D  deficiency     Past Surgical History:  Procedure Laterality Date   BACK SURGERY     CARPAL TUNNEL RELEASE Bilateral    HAND SURGERY Left    laceration artery,?tendon   HIP SURGERY Bilateral 04/2005   replacement   REVERSE SHOULDER ARTHROPLASTY Right 09/27/2016   Procedure: RIGHT REVERSE SHOULDER ARTHROPLASTY;  Surgeon: Marcey Her, MD;  Location: [facility] OR;  Service: Orthopedics;  Laterality: Right;   SHOULDER SURGERY Bilateral    rotator cuff repairs  several     reports that she has never smoked. She has been exposed to tobacco smoke. She has never used smokeless tobacco. She reports that she does not drink alcohol  and does not use drugs.  Allergies[1]  History reviewed. No pertinent family history.  Prior to Admission medications  Medication Sig Start Date End Date Taking? Authorizing Provider  albuterol  (VENTOLIN  HFA) 108 (90 Base) MCG/ACT inhaler Inhale 1-2 puffs into the lungs every 6 (six) hours as needed for wheezing or shortness of breath. 08/27/24   Ruthell Lauraine FALCON, NP  amLODipine  (NORVASC ) 5 MG tablet Take 1 tablet (5 mg total) by mouth daily. 05/31/24 10/19/24  Shlomo Wilbert SAUNDERS, MD  cholecalciferol  (VITAMIN  D) 1000 units tablet Take 5,000 Units by mouth once a week.    [provider]  FeFum-FePoly-FA-B Cmp-C-Biot (INTEGRA PLUS ) CAPS Take 1 capsule by mouth daily. 08/18/23   Sherrod Sherrod, MD  Multiple Vitamin (MULTIVITAMIN WITH MINERALS) TABS tablet Take 1 tablet by mouth daily.    [provider]  mupirocin  ointment (BACTROBAN ) 2 % Apply 1 Application topically 2 (two) times daily. 05/28/24   Christine Rush, DPM  simvastatin  (ZOCOR ) 20 MG tablet Take 20 mg by mouth daily.     [provider]  Spacer/Aero-Holding Chambers (AEROCHAMBER PLUS) Device Use as needed with albuterol  inhaler 08/27/24   Groce, Sarah F, NP  trolamine salicylate (ASPERCREME) 10 % cream Apply 1 application topically daily as needed for muscle pain.    [provider]  valsartan  (DIOVAN ) 320 MG tablet Take 1 tablet (320 mg total) by mouth daily. 05/31/24   Shlomo Wilbert SAUNDERS, MD    Physical Exam: Constitutional: Moderately built and nourished. Vitals:   10/31/24 1655 10/31/24 1900 10/31/24 1930 10/31/24 2045  BP:  (!) 191/89 (!) 184/126   Pulse:  85 85   Resp:  20 (!) 26   Temp: 97.7 F (36.5 C)   97.6 F (36.4 C)  TempSrc: Axillary   Oral  SpO2:  96% 96%   Weight:      Height:       Eyes: Anicteric no pallor. ENMT: No discharge from the ears eyes nose or mouth. Neck: No mass felt.  No neck rigidity.  JVD elevated. Respiratory: No rhonchi or crepitations. Cardiovascular: S1-S2 heard. Abdomen: Soft nontender bowel sound present. Musculoskeletal: Bilateral lower extremity edema present. Skin: Chronic skin changes. Neurologic: Alert awake oriented to place and person moving all extremities. Psychiatric: Oriented to place and person.   Labs on Admission: I have personally reviewed following labs and imaging studies  CBC: Recent Labs  Lab 10/31/24 1811  WBC 6.8  NEUTROABS 4.8  HGB 11.7*  HCT 36.5  MCV 91.5  PLT 316   Basic Metabolic Panel: Recent Labs  Lab 10/31/24 1811  NA 134*  K 4.2  CL 99  CO2 23  GLUCOSE 113*  BUN 22  CREATININE 1.26*  CALCIUM 9.0   GFR: Estimated Creatinine Clearance: 20.1 mL/min (A) (by C-G formula based on SCr of 1.26 mg/dL (H)). Liver Function Tests: Recent Labs  Lab 10/31/24 1811  AST 35  ALT 20  ALKPHOS 78  BILITOT 0.4  PROT 7.1  ALBUMIN 4.1   No results for input(s): LIPASE, AMYLASE in the last 168 hours. No results for input(s): AMMONIA in the last 168 hours. Coagulation Profile: No results for  input(s): INR, PROTIME in the last 168 hours. Cardiac Enzymes: No results for input(s): CKTOTAL, CKMB, CKMBINDEX, TROPONINI in the last 168 hours. BNP (last 3 results) Recent Labs    05/31/24 0900 06/03/24 1127 10/31/24 1811  PROBNP 2,478* 1,988* 10,738.0*   HbA1C: No results for input(s): HGBA1C in the last 72 hours. CBG: No results for input(s): GLUCAP in the last 168 hours. Lipid Profile: No results for input(s): CHOL, HDL, LDLCALC, TRIG, CHOLHDL, LDLDIRECT in the last 72 hours. Thyroid Function Tests: No results for input(s): TSH, T4TOTAL, FREET4, T3FREE, THYROIDAB in the last 72 hours. Anemia Panel: No results for input(s): VITAMINB12, FOLATE, FERRITIN, TIBC, IRON, RETICCTPCT in the last 72 hours. Urine analysis:    Component Value Date/Time   COLORURINE YELLOW 10/31/2024 1849   APPEARANCEUR HAZY (A) 10/31/2024 1849   LABSPEC 1.012 10/31/2024 1849  PHURINE 5.0 10/31/2024 1849   GLUCOSEU NEGATIVE 10/31/2024 1849   HGBUR NEGATIVE 10/31/2024 1849   BILIRUBINUR NEGATIVE 10/31/2024 1849   KETONESUR NEGATIVE 10/31/2024 1849   PROTEINUR >=300 (A) 10/31/2024 1849   NITRITE NEGATIVE 10/31/2024 1849   LEUKOCYTESUR NEGATIVE 10/31/2024 1849   Sepsis Labs: @LABRCNTIP (procalcitonin:4,lacticidven:4) )No results found for this or any previous visit (from the past 240 hours).   Radiological Exams on Admission: CT Head Wo Contrast Result Date: 10/31/2024 EXAM: CT HEAD WITHOUT CONTRAST 10/31/2024 05:41:59 PM TECHNIQUE: CT of the head was performed without the administration of intravenous contrast. Automated exposure control, iterative reconstruction, and/or weight based adjustment of the mA/kV was utilized to reduce the radiation dose to as low as reasonably achievable. COMPARISON: CT head without contrast 05/11/2022. CLINICAL HISTORY: Mental status change, unknown cause. Decreased appetite and increased memory loss. FINDINGS: BRAIN AND  VENTRICLES: Moderate chronic microvascular ischemic change. Generalized volume loss without lobar predominance, consistent with moderate atrophy. White matter changes demonstrate slight progression since the prior exam. No acute hemorrhage. No evidence of acute infarct. No hydrocephalus. No extra-axial collection. No mass effect or midline shift. ORBITS: Bilateral lens replacement noted. SINUSES: Air-fluid level in right sphenoid sinus, compatible with acute sinusitis. SOFT TISSUES AND SKULL: No acute soft tissue abnormality. No skull fracture. IMPRESSION: 1. No acute intracranial abnormality. 2. Moderate chronic microvascular ischemic change and generalized volume loss without lobar predominance, slightly progressed. 3. Right sphenoid sinus air-fluid level, consistent with acute sinusitis. Electronically signed by: Lonni Necessary MD 10/31/2024 06:02 PM EST RP Workstation: [id number]   DG Chest Portable 1 View Result Date: 10/31/2024 CLINICAL DATA:  Shortness of breath.  Bilateral pitting edema. EXAM: PORTABLE CHEST 1 VIEW COMPARISON:  06/14/2024. FINDINGS: Heart is enlarged and the mediastinal contour stable. Atherosclerotic calcification of the aorta is noted. The pulmonary vasculature is distended. Interstitial prominence is noted bilaterally and increased from the prior exams. There is a small right pleural effusion and moderate left pleural effusion with a associated atelectasis or infiltrate. No pneumothorax is seen. Shoulder arthroplasty changes are noted on the right IMPRESSION: 1. Cardiomegaly with pulmonary vascular congestion. 2. Increased interstitial prominence bilaterally with airspace disease at the lung bases, possible edema or infiltrate. 3. Small right pleural effusion and moderate left pleural effusion. Electronically Signed   By: Leita Birmingham M.D.   On: 10/31/2024 17:35    EKG: Independently reviewed.  A-fib rate controlled nonspecific ST-T changes.  Assessment/Plan Principal  Problem:   Acute CHF (congestive heart failure) (HCC) Active Problems:   Normocytic normochromic anemia   Aortic stenosis   CKD (chronic kidney disease) stage 3, GFR 30-59 ml/min (HCC)   Atrial fibrillation (HCC)   Elevated troponin    Acute on chronic HFpEF last EF measured was 55 to 60% with grade 1 diastolic dysfunction on August 2025.  Patient received 20 mg IV Lasix  and I have placed on 20 mg IV every 12 and will closely monitor intake output metabolic panel and daily weights.  Given the history of aortic stenosis and also new onset A-fib will consult cardiology.  Check D-dimer. New onset A-fib rate controlled.  Will keep patient on heparin  infusion for now not sure if patient will be a candidate for long-term anticoagulation.  Consulted cardiology. Elevated troponin likely from CHF exacerbation.  Troponins are flat.  Denies any chest pain.  Cardiology consulted. Chronic interstitial lung disease being followed by Dr. Shelah pulmonologist.  Per pulmonologist note patient's pulmonary function test showed restrictive lung disease which was showing response  to albuterol  the patient has not been using inhalers at home as per the patient's friend.  Chest x-ray does show worsening infiltrates likely from interstitial lung disease and CHF.  Will keep patient on antibiotics for now may discontinue if procalcitonin is negative. Moderate aortic stenosis seen on 2D echo in August 2025.  Per Dr. Shlomo cardiologist note patient was not seeking any active intervention. Chronic kidney disease stage III creatinine at her baseline. Chronic anemia likely from renal disease follow CBC. Cognitive impairment noticed by patient's friend.  Per patient's friend patient has been having slowly progressive chronic cognitive decline.  CT head is unremarkable.  Check TSH B12 levels.  May need assistance at home since patient lives alone.  I discussed with patient's nephew Mr. [name] Crimes who is also healthcare  power of attorney who lives in [place] .  Mr. Grayce Crimes can be reached at [phone].  Per patient's nephew okay to treat medically.  Confirmed DNR status.  DVT prophylaxis: Heparin  infusion for now. Code Status: DNR confirmed with patient's nephew Mr. [name] Crimes who is also healthcare power of attorney. Family Communication: Patient's nephew who is also the healthcare power of attorney and Mr. Grayce Crimes who can be reached at [phone] Disposition Plan: Monitored bed. Consults called: Cardiology. Admission status: Inpatient.         [1]  Allergies Allergen Reactions   Codeine Other (See Comments)    Insomnia    "

## 2024-10-31 NOTE — ED Triage Notes (Signed)
 Pt BIB ems from home for increased bilateral pitting edema in the legs, increased SOB with activity, decreased appetite, increased memory loss for 6 weeks now. A&Ox3. Decreased O2 saturation, 94% on 4L [place]

## 2024-10-31 NOTE — Progress Notes (Addendum)
 PHARMACY - ANTICOAGULATION CONSULT NOTE  Pharmacy Consult for Heparin  Indication: atrial fibrillation  Allergies[1]  Patient Measurements: Height: 5' 1 (154.9 cm) Weight: 47.6 kg (105 lb) IBW/kg (Calculated) : 47.8 HEPARIN  DW (KG): 47.6  Vital Signs: Temp: 97.9 F (36.6 C) (01/04 2130) Temp Source: Oral (01/04 2130) BP: 178/102 (01/04 2130) Pulse Rate: 129 (01/04 2130)  Labs: Recent Labs    10/31/24 1811  HGB 11.7*  HCT 36.5  PLT 316  CREATININE 1.26*    Estimated Creatinine Clearance: 20.1 mL/min (A) (by C-G formula based on SCr of 1.26 mg/dL (H)).   Medical History: Past Medical History:  Diagnosis Date   Aortic stenosis    Moderate by echo 05/2024   Arthritis    Chronic kidney disease, stage 3b (HCC)    GERD (gastroesophageal reflux disease)    occ tums   Heart murmur    High cholesterol    Hyperlipemia    Hypertension    Osteoarthritis    Sleep disturbance    Vitamin D  deficiency     Medications:  Infusions:   Assessment: 89 yo F with new onset Afib.   Not on anticoagulation PTA.  CHAS2VASc=5.   Pharmacy consulted to start IV heparin .  Baseline labs- Hg slightly low at 11.7, pltc WNL, Scr 1.26  Goal of Therapy:  Heparin  level 0.3-0.7 units/ml Monitor platelets by anticoagulation protocol: Yes   Plan:  Give 1000 units bolus x 1 Start heparin  infusion at 550 units/hr Check anti-Xa level in 8 hours and daily while on heparin  Continue to monitor H&H and platelets  Rosaline Millet PharmD 10/31/2024,9:57 PM      [1]  Allergies Allergen Reactions   Codeine Other (See Comments)    Insomnia

## 2024-11-01 ENCOUNTER — Inpatient Hospital Stay (HOSPITAL_COMMUNITY)

## 2024-11-01 DIAGNOSIS — J849 Interstitial pulmonary disease, unspecified: Secondary | ICD-10-CM | POA: Insufficient documentation

## 2024-11-01 DIAGNOSIS — Z66 Do not resuscitate: Secondary | ICD-10-CM | POA: Insufficient documentation

## 2024-11-01 DIAGNOSIS — I5033 Acute on chronic diastolic (congestive) heart failure: Secondary | ICD-10-CM | POA: Diagnosis not present

## 2024-11-01 DIAGNOSIS — Z7189 Other specified counseling: Secondary | ICD-10-CM | POA: Diagnosis not present

## 2024-11-01 DIAGNOSIS — E039 Hypothyroidism, unspecified: Secondary | ICD-10-CM | POA: Insufficient documentation

## 2024-11-01 DIAGNOSIS — E785 Hyperlipidemia, unspecified: Secondary | ICD-10-CM | POA: Diagnosis present

## 2024-11-01 DIAGNOSIS — I509 Heart failure, unspecified: Secondary | ICD-10-CM

## 2024-11-01 DIAGNOSIS — Z515 Encounter for palliative care: Secondary | ICD-10-CM

## 2024-11-01 DIAGNOSIS — G3184 Mild cognitive impairment, so stated: Secondary | ICD-10-CM | POA: Insufficient documentation

## 2024-11-01 DIAGNOSIS — R7989 Other specified abnormal findings of blood chemistry: Secondary | ICD-10-CM | POA: Insufficient documentation

## 2024-11-01 DIAGNOSIS — I10 Essential (primary) hypertension: Secondary | ICD-10-CM | POA: Insufficient documentation

## 2024-11-01 DIAGNOSIS — I50813 Acute on chronic right heart failure: Secondary | ICD-10-CM | POA: Diagnosis not present

## 2024-11-01 LAB — COMPREHENSIVE METABOLIC PANEL WITH GFR
ALT: 15 U/L (ref 0–44)
AST: 31 U/L (ref 15–41)
Albumin: 3.5 g/dL (ref 3.5–5.0)
Alkaline Phosphatase: 63 U/L (ref 38–126)
Anion gap: 12 (ref 5–15)
BUN: 21 mg/dL (ref 8–23)
CO2: 23 mmol/L (ref 22–32)
Calcium: 8.5 mg/dL — ABNORMAL LOW (ref 8.9–10.3)
Chloride: 100 mmol/L (ref 98–111)
Creatinine, Ser: 1.25 mg/dL — ABNORMAL HIGH (ref 0.44–1.00)
GFR, Estimated: 39 mL/min — ABNORMAL LOW
Glucose, Bld: 91 mg/dL (ref 70–99)
Potassium: 4.1 mmol/L (ref 3.5–5.1)
Sodium: 134 mmol/L — ABNORMAL LOW (ref 135–145)
Total Bilirubin: 0.4 mg/dL (ref 0.0–1.2)
Total Protein: 6.1 g/dL — ABNORMAL LOW (ref 6.5–8.1)

## 2024-11-01 LAB — ECHOCARDIOGRAM COMPLETE
AR max vel: 1.47 cm2
AV Area VTI: 1.39 cm2
AV Area mean vel: 1.4 cm2
AV Mean grad: 11 mmHg
AV Peak grad: 17.3 mmHg
Ao pk vel: 2.08 m/s
Area-P 1/2: 4.31 cm2
Calc EF: 46.5 %
Height: 61 in
S' Lateral: 3.6 cm
Single Plane A2C EF: 55.9 %
Single Plane A4C EF: 42.8 %
Weight: 1731.93 [oz_av]

## 2024-11-01 LAB — CBC
HCT: 32 % — ABNORMAL LOW (ref 36.0–46.0)
Hemoglobin: 10.3 g/dL — ABNORMAL LOW (ref 12.0–15.0)
MCH: 29.4 pg (ref 26.0–34.0)
MCHC: 32.2 g/dL (ref 30.0–36.0)
MCV: 91.4 fL (ref 80.0–100.0)
Platelets: 275 K/uL (ref 150–400)
RBC: 3.5 MIL/uL — ABNORMAL LOW (ref 3.87–5.11)
RDW: 15.1 % (ref 11.5–15.5)
WBC: 5.8 K/uL (ref 4.0–10.5)
nRBC: 0 % (ref 0.0–0.2)

## 2024-11-01 LAB — HEPARIN LEVEL (UNFRACTIONATED)
Heparin Unfractionated: 0.34 [IU]/mL (ref 0.30–0.70)
Heparin Unfractionated: 0.45 [IU]/mL (ref 0.30–0.70)

## 2024-11-01 LAB — D-DIMER, QUANTITATIVE: D-Dimer, Quant: 2.27 ug{FEU}/mL — ABNORMAL HIGH (ref 0.00–0.50)

## 2024-11-01 LAB — PROCALCITONIN: Procalcitonin: <0.1 ng/mL

## 2024-11-01 LAB — TSH: TSH: 47.8 u[IU]/mL — ABNORMAL HIGH (ref 0.350–4.500)

## 2024-11-01 LAB — VITAMIN B12: Vitamin B-12: 792 pg/mL (ref 180–914)

## 2024-11-01 LAB — MAGNESIUM: Magnesium: 2 mg/dL (ref 1.7–2.4)

## 2024-11-01 LAB — T4, FREE: Free T4: 0.77 ng/dL — ABNORMAL LOW (ref 0.80–2.00)

## 2024-11-01 MED ORDER — SODIUM CHLORIDE 0.9 % IV SOLN
100.0000 mg | Freq: Two times a day (BID) | INTRAVENOUS | Status: DC
  Administered 2024-11-01: 100 mg via INTRAVENOUS
  Filled 2024-11-01 (×2): qty 100

## 2024-11-01 MED ORDER — LEVOTHYROXINE SODIUM 50 MCG PO TABS
50.0000 ug | ORAL_TABLET | Freq: Every day | ORAL | Status: DC
  Administered 2024-11-02 – 2024-11-03 (×2): 50 ug via ORAL
  Filled 2024-11-01 (×2): qty 1

## 2024-11-01 MED ORDER — SIMVASTATIN 20 MG PO TABS
20.0000 mg | ORAL_TABLET | Freq: Every day | ORAL | Status: DC
  Administered 2024-11-01: 20 mg via ORAL
  Filled 2024-11-01: qty 1

## 2024-11-01 MED ORDER — ADULT MULTIVITAMIN W/MINERALS CH
1.0000 | ORAL_TABLET | Freq: Every day | ORAL | Status: DC
  Administered 2024-11-02: 1 via ORAL
  Filled 2024-11-01: qty 1

## 2024-11-01 MED ORDER — METOPROLOL TARTRATE 25 MG PO TABS
12.5000 mg | ORAL_TABLET | Freq: Two times a day (BID) | ORAL | Status: DC
  Administered 2024-11-01 – 2024-11-02 (×3): 12.5 mg via ORAL
  Filled 2024-11-01 (×3): qty 1

## 2024-11-01 NOTE — Consult Note (Addendum)
 "                              Consultation Note Date: 11/01/2024   Patient Name: [name]  DOB: 30-Dec-1926  MRN: [id number]  Age / Sex: 89 y.o., female  PCP: Okey Carlin Redbird, MD Referring Physician: Barbarann Nest, MD  Reason for Consultation: Establishing goals of care  HPI/Patient Profile: 89 y.o. female  with past medical history significant of chronic heart failure preserved ejection fraction, moderate aortic stenosis.  2D echo done in August 2025, hypertension, chronic kidney disease stage III, and anemia admitted on 10/31/2024 with shortness of breath with minimal exertion x 5 days.  Patient does have chronic lower extremity edema and had followed up with cardiology Dr. Shlomo who had placed her on Lasix  but medication reconciliation does not show if patient is still taking Lasix .  During the evaluation patient denies any chest pain, productive cough, fever and chills.  Patient's friend who brought her to the hospital also noted that patient has progressively had some cognitive decline.  Patient is also following with pulmonologist for chronic interstitial lung disease with PFT showing restrictive lung disease which was responsive to albuterol .  In the ED, chest x-ray shows pleural effusion, with proBNP of 10,000 and elevated troponin of 116 which remained flat.  Chest x-ray also shows increased interstitial infiltrates though patient does not have any productive cough or fever or chills or leukocytosis.  EKG was showing atrial fibrillation which is new onset rate controlled.  CT head was unremarkable.  Patient was given 20 mg of IV Lasix  and admitted for further workup.  PMT has been consulted to assist with goals of care conversation. Patient/Family face treatment option decisions, advanced directive decisions and anticipatory care needs.   Family face treatment option decision, advance directive decisions and anticipatory care needs.   This is hospital day #1  Clinical Assessment  and Goals of Care:  I have reviewed medical records including: EPIC notes: Reviewed cardiologist consult note from 11/01/2024 detailing plan of care mainly for acute on chronic heart failure with preserved ejection fraction, noted patient is volume overloaded likely secondary to new onset atrial fibrillation.  Agree with Lasix  20 mg IV twice daily.  Patient also with new onset atrial fibrillation plan for rate control at this point.  Reviewed to track clinical course and prognostication.  Vital signs: Vital signs reviewed from 11/01/2024 at 10:04 AM: Temperature 98.7 F, blood pressure 140/76 mmHg, heart rate 74 bpm, RR 22 breaths/min, patient is saturating well at 96% on room air. MAR: Reviewed PRN meds received over the last 24 hours. Reviewed to assess needs for medication adjustment to optimize comfort.   Available advanced directives in ACP: None Labs: Lab results reviewed from 11/01/2024: Elevated but improving creatinine at 1.25.  Hemoglobin low but stable at 10.3 and hematocrit of 32.0 reviewed to assist with prescribing and prognostication  Met with patient to assess and discuss diagnosis prognosis, GOC, EOL wishes, disposition and options.  I later reach out to patient's nephew Mullins telephonically to discuss goals of care.  I introduced Palliative Medicine to both patient and her nephew as specialized medical care for people living with serious illness. It focuses on providing relief from the symptoms and stress of a serious illness. The goal is to improve quality of life for both the patient and the family.  Created space and opportunity for family to explore thoughts and feelings regarding  patient's current medical condition.   Patient is observed resting  in bed.  No family members present.  Not in any form of acute distress. No signs or gestures indicating pain or discomfort. Alert and oriented x 3, very hard of hearing but able to make needs known. Able to engage in a meaningful  conversation.  Patient appears to be anxious and frequently verbalizes that she wants to go home.  During the encounter, the patient and his nephew were engaged to review their understanding of the reason for the current hospital admission. The patient was able to articulate the primary reason for hospitalization, stating that she was brought here because she is having problem with breathing even with minimal exertion.  She shared that she thought she will just be in the emergency room briefly and go home, and that she did not expect to be admitted to which she is upset about.  The nephew Mullins who is living [place] of [place] reports that he has noticed overall decline in patient's health during the last time that he visited her (around Thanksgiving).  The overall severity of the patients illness was discussed with the patient and her nephew Mullins. We discussed best and worst-case scenarios in the context of the patient's current medical condition and underlying comorbidities. It was explained that the patient is experiencing a significant burden of disease, and that currently we are treating her for acute on chronic heart failure with multiple other chronic conditions including new onset atrial fibrillation, aortic stenosis, interstitial lung disease, and chronic kidney disease contributing to a complex clinical picture. The discussion included education about the high risk for further health decline, potential for setbacks, and the likelihood of recurrent hospitalizations. The patient and his nephew were informed that, even with recovery from the current episode, the overall prognosis remains guarded due to the cumulative impact of ongoing health challenges.  The nephew acknowledged the ongoing decline and expressed understanding of the disease trajectory.   We discussed the importance of establishing clear goals of care, ensuring that all interventions and treatments align with the patients beliefs, values,  and preferences. The patient and family were encouraged to share their wishes and priorities, and we reviewed how our care plan can best support these goals, whether that involves pursuing aggressive interventions or focusing on comfort measures. Patient shared that when her health worsens, she does not want to pursue with aggressive, life-prolonging measures, instead would like to proceed with comfort focused pathway.  We discussed the patient's  hope for being able to go home soon , as well as her worries or concerns regarding being in the hospital for prolonged period of time, expressed as  I do not want to be here. This perspectives were acknowledged and incorporated into ongoing care planning.  We reviewed the options and implications of artificial feeding (such as feeding tube placement) and other life-prolonging measures.  Patient made it very clear that given her current medical state, that she do not wish life-prolonging measures that often equates with pain and suffering but instead just wanting to be comfortable and suffer-free.  We further explored the pathway for care escalation should the patients condition worsen. The patient and her nephew were given the opportunity to consider whether they would want escalation in the level of care (such as transfer to intensive care or use of non-invasive ventilation), or if they would prefer to shift to a comfort-focused approach, prioritizing symptom management and quality of life. The patient and the nephew expressed  understanding of these options and verbalized that when patient's condition worsen, that they would want to pursue comfort focused care.  I provided education on the philosophy of hospice and palliative care, highlighting the focus on symptom management, comfort, and support for both patient and family.  I shared that hospice is a federal Medicare benefit.  We discussed the different settings in which these services can be provided,  including home hospice and residential hospice facilities, and the types of support available in each.  Patient is familiar with hospice services as she mentioned that her husband and son were under hospice care during the end stage of their life and did receive quality care.  The patient is agreeable for a hospice liaison to visit her to provide more information.  The current medical plan was reviewed, with a focus on treating reversible conditions and medical optimization.  I summarized that based on our conversation today, patient/and her nephew opted that we will continue to address treatable issues as appropriate, while also allowing time to assess outcomes and adjust the plan as needed, hoping for clinical improvement, then once medically optimized, to proceed with hospice at home pathway.  Values and goals of care important to patient and family were attempted to be elicited.   Discussed the importance of continued conversation with family and the medical providers regarding overall plan of care and treatment options, ensuring decisions are within the context of the patients values and GOCs.   Questions and concerns were addressed.  The family was encouraged to call with questions or concerns.  PMT will continue to support holistically.   Social History: Patient lives by herself, with a reliable caregiver/friend available most of the time.  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  She enjoyed going to church, made some pillows for the last marginate's.  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  Widowed.  No living children.  Functional and Nutritional State: Patient is currently with functional limitation due to acute and chronic illnesses.  Patient is ambulatory with the use of a cane and more a walker at baseline.  Appetite has been good at baseline.  Palliative Symptoms: Generalized weakness, shortness of breath, intermittent confusion.  Advance  Directives: A detailed discussion regarding advanced directives was held with the patient and family today, highlighting the benefits of having clear documentation to guide care in accordance with the patients wishes. The patient has previously completed advanced directive documents. I advised the nephew to provide a copy for inclusion in our medical records to ensure their preferences are honored.  Code Status: DNR limited  Primary Decision Maker: Patient Patient next of kin, Mullins McLaughin    SUMMARY OF RECOMMENDATIONS   # Complex medical decision making/goals of care Code Status: Maintain DNR/DNI status Goal of care is medical stabilization and recovery to the extent this is possible Once medically optimized, pursue home with hospice.  TOC consult placed (11/01/2024) to assist. Spiritual care consult-declined at this time, stating that if charge pastor will come and offer prayer. Palliative medicine team will continue to follow.   # Symptom management Per Primary team Palliative medicine is available to assist as needed.    # Psychosocial support Continue to provide psycho-social and emotional support to patient and family  # Discharge planning Medically optimized, then discharge home with hospice  # Treatment plan discussed with:  Patient, patient's nephew Mullins, nursing staff, and hospitalist Dr. Barbarann  Palliative  Prophylaxis:  Aspiration, Bowel Regimen, Delirium Protocol, Frequent Pain Assessment, Oral Care, and Turn Reposition   Prognosis:  Prognosis is guarded given significant disease burden, and high risk for decompensation  Discharge Planning: Home with Hospice      Primary Diagnoses: Present on Admission:  Normocytic normochromic anemia    Physical Exam Vitals and nursing note reviewed.  Constitutional:      Appearance: Normal appearance.  HENT:     Head: Normocephalic and atraumatic.     Mouth/Throat:     Mouth: Mucous membranes are moist.   Cardiovascular:     Rate and Rhythm: Normal rate. Rhythm irregular.  Pulmonary:     Comments: Shortness of breath with exertion.  Musculoskeletal:     Comments: Generalized weakness  Skin:    General: Skin is warm.  Neurological:     General: No focal deficit present.     Mental Status: She is alert and oriented to person, place, and time.  Psychiatric:        Mood and Affect: Mood normal.     Comments: Can be anxious     Vital Signs: BP (!) 140/76 (BP Location: Left Arm)   Pulse 99   Temp 98.7 F (37.1 C) (Oral)   Resp (!) 22   Ht 5' 1 (1.549 m)   Wt 49.1 kg   SpO2 96%   BMI 20.45 kg/m  Pain Scale: 0-10   Pain Score: 0-No pain   SpO2: SpO2: 96 % O2 Device:SpO2: 96 % O2 Flow Rate: .O2 Flow Rate (L/min): 4 L/min   Palliative Assessment/Data: 40 to 50 %   I personally spent a total of 90 minutes in the care of the patient today including preparing to see the patient, getting/reviewing separately obtained history, performing a medically appropriate exam/evaluation, counseling and educating, placing orders, referring and communicating with other health care professionals, documenting clinical information in the EHR, communicating results, and coordinating care.   Thank you for allowing us  to participate in the care of Vernell JAYSON Philis Kathlyne JULIANNA Tracie Mickey, NP  Palliative Medicine Team Team phone # [phone]  Thank you for allowing the Palliative Medicine Team to assist in the care of this patient. Please utilize secure chat with additional questions, if there is no response within 30 minutes please call the above phone number.  Palliative Medicine Team providers are available by phone from 7am to 7pm daily and can be reached through the team cell phone.  Should this patient require assistance outside of these hours, please call the patient's attending physician.   "

## 2024-11-01 NOTE — Progress Notes (Signed)
 Mobility Specialist - Progress Note   11/01/24 1453  Mobility  Activity Pivoted/transferred to/from [facility]  Level of Assistance +2 (takes two people)  Musician  Range of Motion/Exercises Active  Activity Response Tolerated well  Mobility visit 1 Mobility  Mobility Specialist Start Time (ACUTE ONLY) 1440  Mobility Specialist Stop Time (ACUTE ONLY) 1453  Mobility Specialist Time Calculation (min) (ACUTE ONLY) 13 min   Pt was found in bed requesting assistance to [facility]. At EOS returned to bed with all needs met. Call bell in reach and NT in room.   Erminio Leos,  Mobility Specialist Can be reached via Secure Chat

## 2024-11-01 NOTE — Progress Notes (Addendum)
 " Progress Note   Patient: [name] FMW:[id number] DOB: Feb 27, 1927 DOA: 10/31/2024     1 DOS: the patient was seen and examined on 11/01/2024   Brief hospital course: 89yo with h/o chronic HFpEF, moderate AS, HTN, stage 3b CKD, ILD, and anemia who presented on 1/3 with SOB.  CXR with small pleural effusion, elevated proBNP, troponin 116.  Found to be in new afib, rate controlled.  Given Lasix , cardiology consulted.    Assessment & Plan Acute on chronic heart failure with preserved ejection fraction (HFpEF) (HCC) Patient with known h/o chronic diastolic CHF (grade 1 DD in 05/2024) presenting with worsening SOB and hypoxia to 81% CXR with vascular congestion, probable bibasilar edema, small R and moderate L pleural effusion Elevated BNP  With elevated BNP and abnl CXR, acute decompensated CHF seems probable as diagnosis Admitted to telemetry in the setting of new afib Will request repeat echocardiogram CHF order set utilized Was given Lasix  20 mg x 1 in ER and will repeat with 20 mg IV BID Continue [place] O2 for now Stable kidney function at this time, will follow Mildly elevated HS troponin is likely related to demand ischemia; doubt ACS based on symptoms and negative delta Cardiology is consulting Patient is extremely upset about being in the hospital; she will be assessed for H@H  New onset atrial fibrillation (HCC) Rate controlled without medication Started on low-dose metoprolol  by cardiology Will keep patient on heparin  infusion for now, although she does not appear to be a candidate for long-term anticoagulation Consulted cardiology  Elevated troponin Likely associated with demand ischemia in the setting of CHF exacerbation Troponins are flat Denies any chest pain Cardiology consulted Echo pending  Normocytic normochromic anemia Chronic disease/renal disease  Continue Integra Plus  Aortic stenosis Noted on echo in August 2025 Per Dr. Shlomo, patient was not seeking any active  intervention She is unlikely to be a candidate for intervention Repeat echo is pending Chronic kidney disease, stage 3b (HCC) Appears to be stable at this time Attempt to avoid nephrotoxic medications Recheck BMP in AM  ILD (interstitial lung disease) (HCC) Followed by Dr. Shelah  Per late pulmonology note, patient's pulmonary function test showed restrictive lung disease which was showing response to albuterol , but the patient has not been using inhalers at home as per the patient's friend Chest x-ray does show worsening infiltrates likely from interstitial lung disease and CHF Given doxycycline  x 1 Will stop antibiotics given clinical presentation and negative procalcitonin MCI (mild cognitive impairment) Per patient's friend, patient has been having slowly progressive chronic cognitive decline CT head is unremarkable May need assistance at home since patient lives alone Significantly abnormal TSH which may be a factor Hypothyroidism Significantly elevated TSH, depressed free T4 Appears to have new hypothyroidism Will start Synthroid  50 mcg daily She will need f/u thyroid testing in 4-6 weeks Dyslipidemia Continue simvastatin  Essential hypertension On amlodipine  at home - will hold given possible need for rate-controlling medication Continue valsartan  Stop low-dose metoprolol  Elevated d-dimer She does have SOB with hypoxia  D-dimer is nonspecific Needs evaluation for PE Currently on heparin  drip given new afib With her stage 3b CKD, will order VQ scan DNR (do not resuscitate) Patient's nephew/HCPOA is Mr. [name] Crimes, lives in [place] , [phone].  Per patient's nephew, okay to treat medically Confirmed DNR status       Consultants: Cardiology  Procedures: Echocardiogram  Antibiotics: Doxycycline  1/5  30 Day Unplanned Readmission Risk Score    Flowsheet Row ED to Hosp-Admission (Current) from  10/31/2024 in [place] [facility] 4TH FLOOR PROGRESSIVE CARE AND  UROLOGY  30 Day Unplanned Readmission Risk Score (%) 12.88 Filed at 11/01/2024 0400    This score is the patient's risk of an unplanned readmission within 30 days of being discharged (0 -100%). The score is based on dignosis, age, lab data, medications, orders, and past utilization.   Low:  0-14.9   Medium: 15-21.9   High: 22-29.9   Extreme: 30 and above           Subjective: She is very upset about being hospitalized and desperate to go home.  She has not been consistent in wearing O2 and is still hypoxic.  She is being considered for H@H .  She declined home with hospice, which was also offered.  Still having some SOB, denies CP.  I spoke with her nephew.  He is very open to H@H .  She did use a computer for a while.     Objective: Vitals:   11/01/24 0900 11/01/24 1004  BP:  (!) 140/76  Pulse:  99  Resp: (!) 23 (!) 22  Temp:  98.7 F (37.1 C)  SpO2:  96%    Intake/Output Summary (Last 24 hours) at 11/01/2024 1253 Last data filed at 11/01/2024 0800 Gross per 24 hour  Intake 537.04 ml  Output --  Net 537.04 ml   Filed Weights   10/31/24 1648 11/01/24 0500  Weight: 47.6 kg 49.1 kg    Exam:  General:  Appears calm and comfortable and is in NAD, O2 88% on RA and improved with [place] O2; frail Eyes:  normal lids, iris ENT:  grossly normal hearing, lips & tongue, mmm Cardiovascular:  RRR. No LE edema.  Respiratory:   CTA bilaterally with no wheezes/rales/rhonchi.  Normal respiratory effort. Abdomen:  soft, NT, ND Skin:  no rash or induration seen on limited exam Musculoskeletal:  grossly normal tone BUE/BLE, good ROM, no bony abnormality Psychiatric:  mildly anxious mood and affect, speech fluent and appropriate, AOx3 Neurologic:  CN 2-12 grossly intact, moves all extremities in coordinated fashion  Data Reviewed: I have reviewed the patient's lab results since admission.  Pertinent labs for today include:   Na++ 134, not clinically significant BUN 21/Creatinine 1.25/GFR  39, stable ProBNP 10,738 HS troponin 116, 106 Procalcitonin <0.10 WBC 5.8 Hgb 10.3, 11.7 on presentation D-dimer 2.27 TSH 47.800 UA: >300 protein, rare bacteria     Family Communication: None present; I spoke with her nephew/POA by telephone  Mobility: PT/OT Consulted     Code Status: Limited: Do not attempt resuscitation (DNR) -DNR-LIMITED -Do Not Intubate/DNI    Disposition: Status is: Inpatient Remains inpatient appropriate because: ongoing management     Time spent: 50 minutes  Unresulted Labs (From admission, onward)     Start     Ordered   11/02/24 0500  Heparin  level (unfractionated)  Daily,   R      11/01/24 0813   11/02/24 0500  Basic metabolic panel with GFR  Tomorrow morning,   R        11/01/24 1253   11/01/24 1500  Heparin  level (unfractionated)  Once-Timed,   TIMED        11/01/24 0813   11/01/24 0832  T3  Add-on,   AD        11/01/24 0831   11/01/24 0500  CBC  Daily,   R      10/31/24 2200             Author: Delon  Barbarann, MD 11/01/2024 12:53 PM  For on call review [email or web address].            "

## 2024-11-01 NOTE — Assessment & Plan Note (Addendum)
-  Appears to be stable at this time -Attempt to avoid nephrotoxic medications -Recheck BMP in AM  

## 2024-11-01 NOTE — Progress Notes (Signed)
 PHARMACY - ANTICOAGULATION CONSULT NOTE  Pharmacy Consult for Heparin  Indication: atrial fibrillation  Allergies[1]  Patient Measurements: Height: 5' 1 (154.9 cm) Weight: 49.1 kg (108 lb 3.9 oz) IBW/kg (Calculated) : 47.8 HEPARIN  DW (KG): 47.6  Vital Signs: Temp: 98.8 F (37.1 C) (01/05 0558) Temp Source: Oral (01/05 0558) BP: 142/72 (01/05 0558) Pulse Rate: 103 (01/05 0558)  Labs: Recent Labs    10/31/24 1811 11/01/24 0240 11/01/24 0702  HGB 11.7* 10.3*  --   HCT 36.5 32.0*  --   PLT 316 275  --   HEPARINUNFRC  --   --  0.34  CREATININE 1.26* 1.25*  --     Estimated Creatinine Clearance: 20.3 mL/min (A) (by C-G formula based on SCr of 1.25 mg/dL (H)).   Medical History: Past Medical History:  Diagnosis Date   Aortic stenosis    Moderate by echo 05/2024   Arthritis    Chronic kidney disease, stage 3b (HCC)    GERD (gastroesophageal reflux disease)    occ tums   Heart murmur    High cholesterol    Hyperlipemia    Hypertension    Osteoarthritis    Sleep disturbance    Vitamin D  deficiency     Medications:  Infusions:   doxycycline  (VIBRAMYCIN ) IV Stopped (11/01/24 0523)   heparin  550 Units/hr (11/01/24 0555)    Assessment: 89 yo F with new onset Afib.   Not on anticoagulation PTA.  CHAS2VASc=5.   Pharmacy consulted to start IV heparin .    -First heparin  level 0.34 - therapeutic with heparin  infusing at 550 units/hr -Hgb/plt slightly down from initial labs -No complications of therapy noted  Goal of Therapy:  Heparin  level 0.3-0.7 units/ml Monitor platelets by anticoagulation protocol: Yes   Plan:  -Continue heparin  infusion at 550 units/hr -Check confirmatory heparin  level around 1500 -Daily CBC and heparin  level -Follow up plan for long term anticoagulation   Stefano MARLA Bologna, PharmD, BCPS Clinical Pharmacist 11/01/2024 8:08 AM       [1]  Allergies Allergen Reactions   Codeine Other (See Comments)    Insomnia

## 2024-11-01 NOTE — Consult Note (Signed)
 "  Cardiology Consultation  Patient ID: [name] MRN: [id number]; DOB: September 19, 1927  Admit date: 10/31/2024 Date of Consult: 11/01/2024  PCP:  Okey Carlin Redbird, MD   [facility] Providers Cardiologist:  Wilbert Bihari, MD     Patient Profile: [name] is a 89 y.o. female with a hx of moderate aortic stenosis, chronic HFpEF, CKD stage IIIb, GERD, hyperlipidemia, hypertension, chronic interstitial lung disease, and osteoarthritis  who is being seen 11/01/2024 for the evaluation of acute on chronic CHF, new onset A-Fib at the request of Dr. Barbarann.  History of Present Illness: Ms. [name] has past medical history as listed above.  She presented to the [facility] emergency department on 10/31/2024 by family friend who noted progressive worsening symptoms over the last 6 weeks.  They noted that she has sudden drop in appetite, not eating any significant amount of food, becoming progressively short of breath with increasing dyspnea on exertion.  Patient reports that she has been having increased lower extremity edema.  Her friend had noted that she had a change in her mental status, noting memory loss that has not been present prior.  Patient denies any falls however her family friend notes that due to the condition of the house will be very likely that there could have been a fall.  Relevant workup this admission includes: proBNP elevated at 10,738, troponin 116 ? 106, UA showed proteinuria, procalcitonin negative, CBC showed chronic anemia with hemoglobin around baseline at 10.3, TSH significantly elevated, magnesium normal, metabolic panel showed hyponatremia with sodium 134, creatinine below previous baseline at 1.26 on arrival, D-dimer elevated.  EKG showed A-Fib with HR 88.   CXR showed cardiomegaly with pulmonary vascular congestion, small right pleural effusion, moderate left pleural effusion.  CT head showed no acute intracranial abnormality, chronic microvascular ischemic changes,  findings consistent with acute sinusitis.  She is a patient of Dr. Dorine, last seen August 2025.  She was doing well at this appointment, noting that her edema was well-controlled on diuretic therapy, denying any anginal symptoms.  She reported some mild dyspnea on exertion with ambulation.  Her medication regimen at this time included: Amlodipine  5 mg daily, valsartan  320 mg daily, Lasix  20 mg daily due to renal function.  Note indicates that they were unable to use beta-blockers due to resting heart rate in the 60s.  Of note, patient does have her nephew (who lives in [place]) as her POA. Mr. [name] Crimes, who is able to be reached at [phone]. The primary team has been in close contact with him to confirm DNRs status and treatment plan.   After speaking with the patient, patient states she has had dyspnea on exertion for 4 weeks.  She also has noticed bilateral lower extremity edema.  She denies chest pain, palpitations or syncope.  She does fall occasionally at home.  Past Medical History:  Diagnosis Date   Aortic stenosis    Moderate by echo 05/2024   Arthritis    Chronic kidney disease, stage 3b (HCC)    GERD (gastroesophageal reflux disease)    occ tums   Heart murmur    High cholesterol    Hyperlipemia    Hypertension    Osteoarthritis    Sleep disturbance    Vitamin D  deficiency    Past Surgical History:  Procedure Laterality Date   BACK SURGERY     CARPAL TUNNEL RELEASE Bilateral    HAND SURGERY Left    laceration artery,?tendon  HIP SURGERY Bilateral 04/2005   replacement   REVERSE SHOULDER ARTHROPLASTY Right 09/27/2016   Procedure: RIGHT REVERSE SHOULDER ARTHROPLASTY;  Surgeon: Marcey Her, MD;  Location: [facility];  Service: Orthopedics;  Laterality: Right;   SHOULDER SURGERY Bilateral    rotator cuff repairs  several    Home Medications:  Prior to Admission medications  Medication Sig Start Date End Date Taking? Authorizing Provider  albuterol  (VENTOLIN  HFA)  108 (90 Base) MCG/ACT inhaler Inhale 1-2 puffs into the lungs every 6 (six) hours as needed for wheezing or shortness of breath. 08/27/24   Ruthell Lauraine FALCON, NP  amLODipine  (NORVASC ) 5 MG tablet Take 1 tablet (5 mg total) by mouth daily. 05/31/24 10/19/24  Shlomo Wilbert SAUNDERS, MD  cholecalciferol  (VITAMIN D ) 1000 units tablet Take 5,000 Units by mouth once a week.    [provider]  FeFum-FePoly-FA-B Cmp-C-Biot (INTEGRA PLUS ) CAPS Take 1 capsule by mouth daily. 08/18/23   Sherrod Sherrod, MD  Multiple Vitamin (MULTIVITAMIN WITH MINERALS) TABS tablet Take 1 tablet by mouth daily.    [provider]  mupirocin  ointment (BACTROBAN ) 2 % Apply 1 Application topically 2 (two) times daily. 05/28/24   Christine Rush, DPM  simvastatin  (ZOCOR ) 20 MG tablet Take 20 mg by mouth daily.    [provider]  Spacer/Aero-Holding Chambers (AEROCHAMBER PLUS) Device Use as needed with albuterol  inhaler 08/27/24   Groce, Sarah F, NP  trolamine salicylate (ASPERCREME) 10 % cream Apply 1 application topically daily as needed for muscle pain.    [provider]  valsartan  (DIOVAN ) 320 MG tablet Take 1 tablet (320 mg total) by mouth daily. 05/31/24   Shlomo Wilbert SAUNDERS, MD   Scheduled Meds:  amLODipine   5 mg Oral Daily   furosemide   20 mg Intravenous Q12H   irbesartan   300 mg Oral Daily   Continuous Infusions:  doxycycline  (VIBRAMYCIN ) IV Stopped (11/01/24 0523)   heparin  550 Units/hr (11/01/24 0555)   PRN Meds: acetaminophen  **OR** acetaminophen   Allergies:   Allergies[1]  Social History:   Social History   Socioeconomic History   Marital status: Widowed    Spouse name: Not on file   Number of children: Not on file   Years of education: Not on file   Highest education level: Not on file  Occupational History   Not on file  Tobacco Use   Smoking status: Never    Passive exposure: Past   Smokeless tobacco: Never  Substance and Sexual Activity   Alcohol  use: No   Drug use: No    Sexual activity: Not on file  Other Topics Concern   Not on file  Social History Narrative   Not on file   Social Drivers of Health   Tobacco Use: Low Risk (10/31/2024)   Patient History    Smoking Tobacco Use: Never    Smokeless Tobacco Use: Never    Passive Exposure: Past  Financial Resource Strain: Not on file  Food Insecurity: Patient Unable To Answer (10/31/2024)   Epic    Worried About Programme Researcher, Broadcasting/film/video in the Last Year: Patient unable to answer    Ran Out of Food in the Last Year: Patient unable to answer  Transportation Needs: Patient Unable To Answer (10/31/2024)   Epic    Lack of Transportation (Medical): Patient unable to answer    Lack of Transportation (Non-Medical): Patient unable to answer  Physical Activity: Not on file  Stress: Not on file  Social Connections: Unknown (10/31/2024)   Social Connection  and Isolation Panel    Frequency of Communication with Friends and Family: Patient unable to answer    Frequency of Social Gatherings with Friends and Family: Patient unable to answer    Attends Religious Services: Patient unable to answer    Active Member of Clubs or Organizations: Patient unable to answer    Attends Banker Meetings: Patient unable to answer    Marital Status: Widowed  Intimate Partner Violence: Patient Unable To Answer (10/31/2024)   Epic    Fear of Current or Ex-Partner: Patient unable to answer    Emotionally Abused: Patient unable to answer    Physically Abused: Patient unable to answer    Sexually Abused: Patient unable to answer  Depression (PHQ2-9): Not on file  Alcohol  Screen: Not on file  Housing: Unknown (10/31/2024)   Epic    Unable to Pay for Housing in the Last Year: Patient unable to answer    Number of Times Moved in the Last Year: 0    Homeless in the Last Year: No  Utilities: Patient Unable To Answer (10/31/2024)   Epic    Threatened with loss of utilities: Patient unable to answer  Health Literacy: Not on file     Family History:   History reviewed. No pertinent family history.   ROS:  Please see the history of present illness.  All other ROS reviewed and negative.     Physical Exam/Data: Vitals:   11/01/24 0157 11/01/24 0500 11/01/24 0558 11/01/24 0900  BP: (!) 146/63  (!) 142/72   Pulse: (!) 50  (!) 103   Resp:   20 (!) 23  Temp: 97.9 F (36.6 C)  98.8 F (37.1 C)   TempSrc: Oral  Oral   SpO2: 96%  94%   Weight:  49.1 kg    Height:        Intake/Output Summary (Last 24 hours) at 11/01/2024 0921 Last data filed at 11/01/2024 0800 Gross per 24 hour  Intake 537.04 ml  Output --  Net 537.04 ml      11/01/2024    5:00 AM 10/31/2024    4:48 PM 08/27/2024   11:30 AM  Last 3 Weights  Weight (lbs) 108 lb 3.9 oz 105 lb 101 lb  Weight (kg) 49.1 kg 47.628 kg 45.813 kg     Body mass index is 20.45 kg/m.   General:  Well nourished, frail in no acute distress HEENT: normal Neck: Supple Vascular: No carotid bruits; Distal pulses 2+ bilaterally Cardiac:  normal S1, S2; irregular, 2/6 systolic murmur left sternal border. Lungs: Mildly diminished breath sounds bases Abd: soft, nontender, no hepatomegaly  Ext: 1+ edema Musculoskeletal:  No deformities, BUE and BLE strength normal and equal Skin: warm and dry  Neuro:  no focal abnormalities noted Psych:  Normal affect   EKG:  The EKG was personally reviewed and demonstrates:  Afib, HR 88  Telemetry:  Telemetry was personally reviewed and demonstrates:  rate controlled Afib, rates up to 130-140s around 8AM 1/5  Relevant CV Studies:  Echocardiogram, 11/01/2024 Ordered, pending results  Echocardiogram, 06/03/2024 Left ventricular ejection fraction, by estimation, is 55 to 60% . Left ventricular ejection fraction by 3D volume is 52 % . The left ventricle has normal function. The left ventricle has no regional wall motion abnormalities. There is mild concentric left ventricular hypertrophy. Left ventricular diastolic parameters are consistent  with Grade I diastolic dysfunction ( impaired relaxation) .  Right ventricular systolic function is normal. The right ventricular size  is normal. There is normal pulmonary artery systolic pressure. The estimated right ventricular systolic pressure is 26. 2 mmHg.  Left atrial size was severely dilated.  Right atrial size was moderately dilated.  The mitral valve is normal in structure. Trivial mitral valve regurgitation. No evidence of mitral stenosis.  The aortic valve is tricuspid. There is moderate calcification of the aortic valve. Aortic valve regurgitation is trivial. Moderate aortic valve stenosis. Aortic valve area, by VTI measures 1. 05 cm . Aortic valve mean gradient measures 10. 0 mmHg.  The inferior vena cava is normal in size with greater than 50% respiratory variability, suggesting right atrial pressure of 3 mmHg.  Laboratory Data:  Recent Labs  Lab 10/31/24 1811 10/31/24 2052  TRNPT 116* 106*      Chemistry Recent Labs  Lab 10/31/24 1811 11/01/24 0240  NA 134* 134*  K 4.2 4.1  CL 99 100  CO2 23 23  GLUCOSE 113* 91  BUN 22 21  CREATININE 1.26* 1.25*  CALCIUM 9.0 8.5*  MG  --  2.0  GFRNONAA 39* 39*  ANIONGAP 12 12    Recent Labs  Lab 10/31/24 1811 11/01/24 0240  PROT 7.1 6.1*  ALBUMIN 4.1 3.5  AST 35 31  ALT 20 15  ALKPHOS 78 63  BILITOT 0.4 0.4    Hematology Recent Labs  Lab 10/31/24 1811 11/01/24 0240  WBC 6.8 5.8  RBC 3.99 3.50*  HGB 11.7* 10.3*  HCT 36.5 32.0*  MCV 91.5 91.4  MCH 29.3 29.4  MCHC 32.1 32.2  RDW 15.1 15.1  PLT 316 275   Thyroid  Recent Labs  Lab 11/01/24 0240  TSH 47.800*    BNP Recent Labs  Lab 10/31/24 1811  PROBNP 10,738.0*    DDimer  Recent Labs  Lab 11/01/24 0240  DDIMER 2.27*    Radiology/Studies:  CT Head Wo Contrast Result Date: 10/31/2024 EXAM: CT HEAD WITHOUT CONTRAST 10/31/2024 05:41:59 PM TECHNIQUE: CT of the head was performed without the administration of intravenous contrast. Automated  exposure control, iterative reconstruction, and/or weight based adjustment of the mA/kV was utilized to reduce the radiation dose to as low as reasonably achievable. COMPARISON: CT head without contrast 05/11/2022. CLINICAL HISTORY: Mental status change, unknown cause. Decreased appetite and increased memory loss. FINDINGS: BRAIN AND VENTRICLES: Moderate chronic microvascular ischemic change. Generalized volume loss without lobar predominance, consistent with moderate atrophy. White matter changes demonstrate slight progression since the prior exam. No acute hemorrhage. No evidence of acute infarct. No hydrocephalus. No extra-axial collection. No mass effect or midline shift. ORBITS: Bilateral lens replacement noted. SINUSES: Air-fluid level in right sphenoid sinus, compatible with acute sinusitis. SOFT TISSUES AND SKULL: No acute soft tissue abnormality. No skull fracture. IMPRESSION: 1. No acute intracranial abnormality. 2. Moderate chronic microvascular ischemic change and generalized volume loss without lobar predominance, slightly progressed. 3. Right sphenoid sinus air-fluid level, consistent with acute sinusitis. Electronically signed by: Lonni Necessary MD 10/31/2024 06:02 PM EST RP Workstation: [id number]   DG Chest Portable 1 View Result Date: 10/31/2024 CLINICAL DATA:  Shortness of breath.  Bilateral pitting edema. EXAM: PORTABLE CHEST 1 VIEW COMPARISON:  06/14/2024. FINDINGS: Heart is enlarged and the mediastinal contour stable. Atherosclerotic calcification of the aorta is noted. The pulmonary vasculature is distended. Interstitial prominence is noted bilaterally and increased from the prior exams. There is a small right pleural effusion and moderate left pleural effusion with a associated atelectasis or infiltrate. No pneumothorax is seen. Shoulder arthroplasty changes are noted on the right  IMPRESSION: 1. Cardiomegaly with pulmonary vascular congestion. 2. Increased interstitial prominence  bilaterally with airspace disease at the lung bases, possible edema or infiltrate. 3. Small right pleural effusion and moderate left pleural effusion. Electronically Signed   By: Leita [place] M.D.   On: 10/31/2024 17:35   Assessment and Plan:  1 acute on chronic heart failure preserved ejection fraction-patient is volume overloaded on exam likely secondary to new onset atrial fibrillation.  Agree with Lasix  20 mg IV twice daily and follow renal function.  Repeat echocardiogram.  2 new onset atrial fibrillation-patient is in atrial fibrillation with intermittent elevation in heart rate.  Will add Toprol  12.5 mg twice daily.  CHA2DS2-VASc is at least 5.  Issue of anticoagulation is complicated.  She is extremely frail and falls at home.  I would be hesitant to anticoagulate her long-term.  Atrial fibrillation is likely contributing to her congestive heart failure.  However she is noted to have biatrial enlargement on previous echocardiogram and not clear that she would hold sinus rhythm.  I am also hesitant to arrange procedures as she is extremely frail.  I am also hesitant to anticoagulate her as outlined above.  Plan will be rate control at this point.  3 Hypertension-patient's blood pressure is controlled.  Continue present medications and follow.   4 Moderate aortic stenosis-history of moderate aortic stenosis-plan follow-up echocardiogram though doubt she would ever be a candidate for TAVR.  5 elevated TSH-markedly elevated TSH.  Further therapy per primary care.  For questions or updates, please contact [facility] Please consult [email or web address] for contact info under  Signed, [place] DELENA Donath, PA-C  11/01/2024 9:21 AM     [1]  Allergies Allergen Reactions   Codeine Other (See Comments)    Insomnia    "

## 2024-11-01 NOTE — Assessment & Plan Note (Addendum)
 Likely associated with demand ischemia in the setting of CHF exacerbation Troponins are flat Denies any chest pain Cardiology consulted Echo pending

## 2024-11-01 NOTE — Assessment & Plan Note (Addendum)
"   Continue simvastatin          "

## 2024-11-01 NOTE — Assessment & Plan Note (Addendum)
 Patient with known h/o chronic diastolic CHF (grade 1 DD in 05/2024) presenting with worsening SOB and hypoxia to 81% CXR with vascular congestion, probable bibasilar edema, small R and moderate L pleural effusion Elevated BNP  With elevated BNP and abnl CXR, acute decompensated CHF seems probable as diagnosis Admitted to telemetry in the setting of new afib Will request repeat echocardiogram CHF order set utilized Was given Lasix  20 mg x 1 in ER and will repeat with 20 mg IV BID Continue [place] O2 for now Stable kidney function at this time, will follow Mildly elevated HS troponin is likely related to demand ischemia; doubt ACS based on symptoms and negative delta Cardiology is consulting Patient is extremely upset about being in the hospital; she will be assessed for H@H 

## 2024-11-01 NOTE — Evaluation (Signed)
 Occupational Therapy Evaluation Patient Details Name: [name] MRN: [id number] DOB: May 15, 1927 Today's Date: 11/01/2024   History of Present Illness   [name] is a 89 y.o. female brought to the ER after patient's friend who frequently visits the patient found that patient was very short of breath with minimal exertion since she saw her 5 days ago.  Patient does have chronic lower EXTR edema; dx Acute CHF, eval for OT potential H@H  PMH: chronic HFpEF, moderate aortic stenosis.  2D echo done in August 2025, hypertension, chronic kidney disease stage III, anemia     Clinical Impressions PTA, patient lives alone and uses a RW at baseline with 2-3 x per month assist from hired caregiver for cleaning support and friend intermittently but patient unable to state frequency or tasks except grocery support. Currently, patient presents with deficits outlined below (see OT Problem List for details) most significantly on supplemental O2, B LE edema, decreased activity tolerance, balance and strength with decreased cognition/insight (purpose of hospitalization/limited insight into needs at home) limiting BADL's and functional mobility performance. Patient requires continued Acute care hospital level OT services to progress safety and functional performance and allow for discharge. Patient will benefit from continued inpatient follow up therapy, <3 hours/day.        If plan is discharge home, recommend the following:   Two people to help with walking and/or transfers;A lot of help with bathing/dressing/bathroom;Assistance with cooking/housework;Direct supervision/assist for medications management;Direct supervision/assist for financial management;Assist for transportation;Help with stairs or ramp for entrance;Supervision due to cognitive status     Functional Status Assessment   Patient has had a recent decline in their functional status and demonstrates the ability to make significant  improvements in function in a reasonable and predictable amount of time.     Equipment Recommendations   None recommended by OT      Precautions/Restrictions   Precautions Precautions: Fall Precaution/Restrictions Comments: monitor sats Restrictions Weight Bearing Restrictions Per Provider Order: No     Mobility Bed Mobility Overal bed mobility: Needs Assistance Bed Mobility: Supine to Sit, Sit to Supine     Supine to sit: Contact guard, HOB elevated, Used rails Sit to supine: Min assist, HOB elevated, Used rails   General bed mobility comments: cues for safety    Transfers Overall transfer level: Needs assistance Equipment used: Rolling walker (2 wheels) Transfers: Sit to/from Stand, Bed to chair/wheelchair/BSC Sit to Stand: Mod assist     Step pivot transfers: Mod assist     General transfer comment: steadying assist for bed to [facility] with assist to manage O2 lines and IV      Balance Overall balance assessment: Needs assistance, History of Falls Sitting-balance support: Feet supported, No upper extremity supported Sitting balance-Leahy Scale: Fair     Standing balance support: During functional activity, Bilateral upper extremity supported, Reliant on assistive device for balance Standing balance-Leahy Scale: Poor                             ADL either performed or assessed with clinical judgement   ADL Overall ADL's : Needs assistance/impaired Eating/Feeding: Set up;Sitting   Grooming: Wash/dry hands;Wash/dry face;Sitting;Set up   Upper Body Bathing: Contact guard assist;Sitting   Lower Body Bathing: Moderate assistance;Sit to/from stand   Upper Body Dressing : Minimal assistance;Sitting   Lower Body Dressing: Moderate assistance;Sit to/from stand   Toilet Transfer: Moderate assistance;BSC/3in1;Rolling walker (2 wheels)   Toileting- Clothing Manipulation and  Hygiene: Moderate assistance;Sitting/lateral lean       Functional  mobility during ADLs: Moderate assistance;Rolling walker (2 wheels) General ADL Comments: poor activity tolerance and decreased functional reach to LE's     Vision Baseline Vision/History: 1 Wears glasses;0 No visual deficits              Pertinent Vitals/Pain Pain Assessment Pain Assessment: No/denies pain     Extremity/Trunk Assessment Upper Extremity Assessment Upper Extremity Assessment: Generalized weakness;Right hand dominant   Lower Extremity Assessment Lower Extremity Assessment: Defer to PT evaluation   Cervical / Trunk Assessment Cervical / Trunk Assessment: Kyphotic   Communication Communication Communication: Impaired Factors Affecting Communication: Hearing impaired   Cognition Arousal: Alert Behavior During Therapy: WFL for tasks assessed/performed, Anxious Cognition: History of cognitive impairments             OT - Cognition Comments: decreased insight, jusdement and STM; difficulty understanding purpose for hospitalization, O2 need, assist need                 Following commands: Impaired Following commands impaired: Follows one step commands inconsistently     Cueing  General Comments   Cueing Techniques: Verbal cues;Tactile cues  SpO2 on 1 ltr O2 94%, amb on RA 94%, back on 1 ltr HFNC at end of session, +2 B LE edema, fatigues easily, reports I am exhausted and just want to sleep on my sofa           Home Living Family/patient expects to be discharged to:: Private residence Living Arrangements: Alone Available Help at Discharge: Neighbor;Friend(s) Type of Home: House Home Access: Stairs to enter [employer] of Steps: 2   Home Layout: One level     Bathroom Shower/Tub: Producer, Television/film/video: Handicapped height     Home Equipment: [employer]ant (2 wheels);Cane - single point;Grab bars - tub/shower   Additional Comments: reports she has someone come to clean 2-3 x's month and a friend  intermittently to assist to get groceries      Prior Functioning/Environment Prior Level of Function : Independent/Modified Independent             Mobility Comments: has assist for groceries, appears  to be struggling to care for self/ memeory ADLs Comments: reports showers self, personal items somewhat unkempt in room bedside    OT Problem List: Decreased strength;Decreased activity tolerance;Impaired balance (sitting and/or standing);Decreased cognition;Decreased safety awareness;Decreased knowledge of use of DME or AE;Decreased knowledge of precautions;Cardiopulmonary status limiting activity;Pain;Increased edema   OT Treatment/Interventions: Self-care/ADL training;Therapeutic exercise;Neuromuscular education;Energy conservation;DME and/or AE instruction;Therapeutic activities;Cognitive remediation/compensation;Patient/family education;Balance training      OT Goals(Current goals can be found in the care plan section)   Acute Rehab OT Goals Patient Stated Goal: to go home OT Goal Formulation: With patient Time For Goal Achievement: 11/15/24 Potential to Achieve Goals: Fair ADL Goals Pt Will Perform Lower Body Bathing: with supervision;sit to/from stand Pt Will Perform Lower Body Dressing: with contact guard assist;sit to/from stand Pt Will Transfer to Toilet: with supervision;regular height toilet;ambulating Pt Will Perform Toileting - Clothing Manipulation and hygiene: with supervision;sit to/from stand Pt/caregiver will Perform Home Exercise Program: Both right and left upper extremity;With Supervision;With written HEP provided   OT Frequency:  Min 2X/week    Co-evaluation   Reason for Co-Treatment: For patient/therapist safety PT goals addressed during session: Mobility/safety with mobility OT goals addressed during session: ADL's and self-care      AM-PAC OT 6 Clicks Daily Activity  Outcome Measure Help from another person eating meals?: A Little Help from  another person taking care of personal grooming?: A Little Help from another person toileting, which includes using toliet, bedpan, or urinal?: A Lot Help from another person bathing (including washing, rinsing, drying)?: A Lot Help from another person to put on and taking off regular upper body clothing?: A Little Help from another person to put on and taking off regular lower body clothing?: A Lot 6 Click Score: 15   End of Session Equipment Utilized During Treatment: Gait belt;Rolling walker (2 wheels);Oxygen Nurse Communication: Mobility status  Activity Tolerance: Patient limited by fatigue Patient left: in bed;with call bell/phone within reach;with bed alarm set  OT Visit Diagnosis: Unsteadiness on feet (R26.81);Cognitive communication deficit (R41.841);Adult, failure to thrive (R62.7)                Time: 8781-8754 OT Time Calculation (min): 27 min Charges:  OT General Charges $OT Visit: 1 Visit OT Evaluation $OT Eval Low Complexity: 1 Low  Charistopher Rumble OT/L Acute Rehabilitation Department  [phone]  11/01/2024, 2:11 PM

## 2024-11-01 NOTE — Hospital Course (Addendum)
 89yo with h/o chronic HFpEF, moderate AS, HTN, stage 3b CKD, ILD, and anemia who presented on 1/3 with SOB.  CXR with small pleural effusion, elevated proBNP, troponin 116.  Found to be in new afib, rate controlled.  Given Lasix , cardiology consulted.

## 2024-11-01 NOTE — Assessment & Plan Note (Addendum)
 On amlodipine  at home - will hold given possible need for rate-controlling medication Continue valsartan  Stop low-dose metoprolol 

## 2024-11-01 NOTE — Assessment & Plan Note (Addendum)
 Noted on echo in August 2025 Per Dr. Shlomo, patient was not seeking any active intervention She is unlikely to be a candidate for intervention Repeat echo is pending

## 2024-11-01 NOTE — Evaluation (Signed)
 Physical Therapy Evaluation Patient Details Name: [name] MRN: [id number] DOB: 12-Jun-1927 Today's Date: 11/01/2024  History of Present Illness  [name] is a 89 y.o. female brought to the ER after patient's friend who frequently visits the patient found that patient was very short of breath with minimal exertion since she saw her 5 days ago.  Patient does have chronic lower EXTR edema; dx Acute CHF, eval for OT potential H@H  PMH: chronic HFpEF, moderate aortic stenosis.  2D echo done in August 2025, hypertension, chronic kidney disease stage III, anemia  Clinical Impression  Pt admitted with above diagnosis.  Pt currently with functional limitations due to the deficits listed below (see PT Problem List). Pt will benefit from acute skilled PT to increase their independence and safety with mobility to allow discharge.     The patient is pleasant, frequently stating that she needs to go home. Patient oriented x 4  but appears to not  grasp need for 24/7 support in her home. Patient did ambulate on Ra x 20' with RW, Spo2 93%. Patient will benefit from continued inpatient follow up therapy, <3 hours/day.  Patient reports that she is independent in her home, uses a RW and has assist for housekeeping.  No friends available for clarification. ZAdmit history indicates some memeory and mobility issues.        If plan is discharge home, recommend the following: A little help with walking and/or transfers;A little help with bathing/dressing/bathroom;Assistance with cooking/housework   Can travel by private vehicle   No    Equipment Recommendations None recommended by PT  Recommendations for Other Services       Functional Status Assessment       Precautions / Restrictions Precautions Precautions: Fall Precaution/Restrictions Comments: monitor sats      Mobility  Bed Mobility                    Transfers Overall transfer level: Needs assistance Equipment used: Rolling  walker (2 wheels) Transfers: Sit to/from Stand Sit to Stand: Mod assist           General transfer comment: steady assist from  bed and BSc    Ambulation/Gait Ambulation/Gait assistance: Min assist Gait Distance (Feet): 20 Feet Assistive device: Rolling walker (2 wheels) Gait Pattern/deviations: Step-to pattern, Step-through pattern, Staggering right, Staggering left Gait velocity: decr     General Gait Details: steady support  to ambulate in room,  Stairs            Wheelchair Mobility     Tilt Bed    Modified Rankin (Stroke Patients Only)       Balance Overall balance assessment: Needs assistance, History of Falls Sitting-balance support: Feet supported, No upper extremity supported Sitting balance-Leahy Scale: Fair     Standing balance support: During functional activity, Bilateral upper extremity supported, Reliant on assistive device for balance Standing balance-Leahy Scale: Poor                               Pertinent Vitals/Pain Pain Assessment Pain Assessment: No/denies pain    Home Living Family/patient expects to be discharged to:: Private residence Living Arrangements: Alone Available Help at Discharge: Neighbor;Friend(s) Type of Home: House Home Access: Stairs to enter   [employer] of Steps: 2   Home Layout: One level Home Equipment: [employer]ant (2 wheels);Cane - single point;Grab bars - tub/shower Additional Comments: reports she has some one  come to clean 2-3 x's month,    Prior Function               Mobility Comments: has assist for groceries, appears  to be struggling to care for self/ memeory ADLs Comments: reports showers self     Extremity/Trunk Assessment        Lower Extremity Assessment Lower Extremity Assessment: Generalized weakness    Cervical / Trunk Assessment Cervical / Trunk Assessment: Kyphotic  Communication   Communication Communication: Impaired Factors Affecting  Communication: Hearing impaired    Cognition Arousal: Alert Behavior During Therapy: WFL for tasks assessed/performed, Anxious   PT - Cognitive impairments: Awareness, Memory, Safety/Judgement                       PT - Cognition Comments: pt. keeps repeating that she needs to go home, Following commands: Intact, Impaired Following commands impaired: Follows one step commands inconsistently     Cueing Cueing Techniques: Verbal cues, Tactile cues     General Comments      Exercises     Assessment/Plan    PT Assessment Patient needs continued PT services  PT Problem List Decreased strength;Decreased mobility;Decreased safety awareness;Decreased activity tolerance;Decreased cognition;Decreased balance       PT Treatment Interventions DME instruction;Gait training;Functional mobility training;Therapeutic activities;Cognitive remediation;Patient/family education    PT Goals (Current goals can be found in the Care Plan section)  Acute Rehab PT Goals PT Goal Formulation: Patient unable to participate in goal setting Time For Goal Achievement: 11/15/24 Potential to Achieve Goals: Fair    Frequency Min 2X/week     Co-evaluation PT/OT/SLP Co-Evaluation/Treatment: Yes Reason for Co-Treatment: For patient/therapist safety PT goals addressed during session: Mobility/safety with mobility OT goals addressed during session: ADL's and self-care       AM-PAC PT 6 Clicks Mobility  Outcome Measure Help needed turning from your back to your side while in a flat bed without using bedrails?: A Little Help needed moving from lying on your back to sitting on the side of a flat bed without using bedrails?: A Little Help needed moving to and from a bed to a chair (including a wheelchair)?: A Little Help needed standing up from a chair using your arms (e.g., wheelchair or bedside chair)?: A Little Help needed to walk in hospital room?: A Lot Help needed climbing 3-5 steps with  a railing? : Total 6 Click Score: 15    End of Session Equipment Utilized During Treatment: Gait belt Activity Tolerance: Patient limited by fatigue Patient left: in bed;with call bell/phone within reach;with bed alarm set Nurse Communication: Mobility status PT Visit Diagnosis: Unsteadiness on feet (R26.81);Muscle weakness (generalized) (M62.81);History of falling (Z91.81);Adult, failure to thrive (R62.7)    Time: 1218-1245 PT Time Calculation (min) (ACUTE ONLY): 27 min   Charges:   PT Evaluation $PT Eval Low Complexity: 1 Low   PT General Charges $$ ACUTE PT VISIT: 1 Visit         [name] PT Acute Rehabilitation Services Office [phone]   [name] 11/01/2024, 1:43 PM

## 2024-11-01 NOTE — Progress Notes (Signed)
 Pharmacy: [facility]    Patient is a 89 y.o F with hx HFpEF who presented to the ED on 10/31/24 with c/o SOB and subsequently found to be in afib.  She is currently on heparin  drip for new onset afib.  - confirmatory heparin  level collected at 2:58 PM is therapeutic at 0.45 - no bleeding documented  Goal of Therapy:  Heparin  level 0.3-0.7 units/ml Monitor platelets by anticoagulation protocol: Yes   Plan: - continue heparin  drip at 550 units/hr - daily heparin  level and cbc - monitor for s/sx bleeding  Iantha Batch, PharmD, BCPS 11/01/2024 3:40 PM

## 2024-11-01 NOTE — Assessment & Plan Note (Addendum)
 Chronic disease/renal disease  Continue Integra Plus 

## 2024-11-01 NOTE — Assessment & Plan Note (Addendum)
 Per patient's friend, patient has been having slowly progressive chronic cognitive decline CT head is unremarkable May need assistance at home since patient lives alone Significantly abnormal TSH which may be a factor

## 2024-11-01 NOTE — Assessment & Plan Note (Addendum)
 Patient's nephew/HCPOA is Mr. [name] Crimes, lives in [place] , [phone].  Per patient's nephew, okay to treat medically Confirmed DNR status

## 2024-11-01 NOTE — Assessment & Plan Note (Addendum)
 Significantly elevated TSH, depressed free T4 Appears to have new hypothyroidism Will start Synthroid  50 mcg daily She will need f/u thyroid testing in 4-6 weeks

## 2024-11-01 NOTE — Assessment & Plan Note (Addendum)
 Followed by Dr. Shelah  Per late pulmonology note, patient's pulmonary function test showed restrictive lung disease which was showing response to albuterol , but the patient has not been using inhalers at home as per the patient's friend Chest x-ray does show worsening infiltrates likely from interstitial lung disease and CHF Given doxycycline  x 1 Will stop antibiotics given clinical presentation and negative procalcitonin

## 2024-11-01 NOTE — Assessment & Plan Note (Signed)
 She does have SOB with hypoxia  D-dimer is nonspecific Needs evaluation for PE Currently on heparin  drip given new afib With her stage 3b CKD, will order VQ scan

## 2024-11-01 NOTE — Assessment & Plan Note (Addendum)
 Rate controlled without medication Started on low-dose metoprolol  by cardiology Will keep patient on heparin  infusion for now, although she does not appear to be a candidate for long-term anticoagulation Consulted cardiology

## 2024-11-02 ENCOUNTER — Inpatient Hospital Stay (HOSPITAL_COMMUNITY)

## 2024-11-02 DIAGNOSIS — N1832 Chronic kidney disease, stage 3b: Secondary | ICD-10-CM | POA: Diagnosis not present

## 2024-11-02 DIAGNOSIS — J9601 Acute respiratory failure with hypoxia: Secondary | ICD-10-CM | POA: Diagnosis present

## 2024-11-02 DIAGNOSIS — Z1330 Encounter for screening examination for mental health and behavioral disorders, unspecified: Secondary | ICD-10-CM

## 2024-11-02 DIAGNOSIS — Z515 Encounter for palliative care: Secondary | ICD-10-CM | POA: Diagnosis not present

## 2024-11-02 DIAGNOSIS — R451 Restlessness and agitation: Secondary | ICD-10-CM

## 2024-11-02 DIAGNOSIS — Z7189 Other specified counseling: Secondary | ICD-10-CM | POA: Diagnosis not present

## 2024-11-02 DIAGNOSIS — Z66 Do not resuscitate: Secondary | ICD-10-CM | POA: Diagnosis not present

## 2024-11-02 DIAGNOSIS — G3184 Mild cognitive impairment, so stated: Secondary | ICD-10-CM | POA: Diagnosis not present

## 2024-11-02 DIAGNOSIS — I480 Paroxysmal atrial fibrillation: Secondary | ICD-10-CM

## 2024-11-02 DIAGNOSIS — J849 Interstitial pulmonary disease, unspecified: Secondary | ICD-10-CM | POA: Diagnosis not present

## 2024-11-02 DIAGNOSIS — I5033 Acute on chronic diastolic (congestive) heart failure: Secondary | ICD-10-CM | POA: Diagnosis not present

## 2024-11-02 LAB — CBC
HCT: 30.5 % — ABNORMAL LOW (ref 36.0–46.0)
Hemoglobin: 9.7 g/dL — ABNORMAL LOW (ref 12.0–15.0)
MCH: 28.9 pg (ref 26.0–34.0)
MCHC: 31.8 g/dL (ref 30.0–36.0)
MCV: 90.8 fL (ref 80.0–100.0)
Platelets: 258 K/uL (ref 150–400)
RBC: 3.36 MIL/uL — ABNORMAL LOW (ref 3.87–5.11)
RDW: 15.1 % (ref 11.5–15.5)
WBC: 6.2 K/uL (ref 4.0–10.5)
nRBC: 0 % (ref 0.0–0.2)

## 2024-11-02 LAB — BASIC METABOLIC PANEL WITH GFR
Anion gap: 11 (ref 5–15)
BUN: 23 mg/dL (ref 8–23)
CO2: 27 mmol/L (ref 22–32)
Calcium: 8.6 mg/dL — ABNORMAL LOW (ref 8.9–10.3)
Chloride: 98 mmol/L (ref 98–111)
Creatinine, Ser: 1.44 mg/dL — ABNORMAL HIGH (ref 0.44–1.00)
GFR, Estimated: 33 mL/min — ABNORMAL LOW
Glucose, Bld: 90 mg/dL (ref 70–99)
Potassium: 3.7 mmol/L (ref 3.5–5.1)
Sodium: 135 mmol/L (ref 135–145)

## 2024-11-02 LAB — T3: T3, Total: 61 ng/dL — ABNORMAL LOW (ref 71–180)

## 2024-11-02 LAB — HEPARIN LEVEL (UNFRACTIONATED): Heparin Unfractionated: 0.38 [IU]/mL (ref 0.30–0.70)

## 2024-11-02 MED ORDER — QUETIAPINE FUMARATE 25 MG PO TABS
25.0000 mg | ORAL_TABLET | Freq: Once | ORAL | Status: AC
  Administered 2024-11-02: 25 mg via ORAL
  Filled 2024-11-02: qty 1

## 2024-11-02 MED ORDER — GLYCOPYRROLATE 0.2 MG/ML IJ SOLN: 0.2000 mg | INTRAMUSCULAR | Status: DC | PRN

## 2024-11-02 MED ORDER — MORPHINE SULFATE (CONCENTRATE) 10 MG /0.5 ML PO SOLN: 5.0000 mg | ORAL | Status: DC | PRN

## 2024-11-02 MED ORDER — GLYCOPYRROLATE 1 MG PO TABS: 1.0000 mg | ORAL_TABLET | ORAL | Status: DC | PRN

## 2024-11-02 MED ORDER — BIOTENE DRY MOUTH MT LIQD: 15.0000 mL | OROMUCOSAL | Status: DC | PRN

## 2024-11-02 MED ORDER — MORPHINE SULFATE (PF) 2 MG/ML IV SOLN: 1.0000 mg | INTRAVENOUS | Status: DC | PRN

## 2024-11-02 MED ORDER — HALOPERIDOL LACTATE 5 MG/ML IJ SOLN
INTRAMUSCULAR | Status: AC
  Administered 2024-11-02: 2 mg via INTRAVENOUS
  Filled 2024-11-02: qty 2

## 2024-11-02 MED ORDER — METOPROLOL SUCCINATE ER 25 MG PO TB24: 25.0000 mg | ORAL_TABLET | Freq: Every day | ORAL | Status: DC

## 2024-11-02 MED ORDER — LORAZEPAM 2 MG/ML PO CONC: 1.0000 mg | ORAL | Status: DC | PRN

## 2024-11-02 MED ORDER — LORAZEPAM 2 MG/ML IJ SOLN
1.0000 mg | INTRAMUSCULAR | Status: DC | PRN
  Administered 2024-11-02 (×2): 1 mg via INTRAVENOUS
  Filled 2024-11-02 (×2): qty 1

## 2024-11-02 MED ORDER — HALOPERIDOL LACTATE 5 MG/ML IJ SOLN
2.0000 mg | Freq: Four times a day (QID) | INTRAMUSCULAR | Status: DC | PRN
  Administered 2024-11-02 – 2024-11-03 (×2): 2 mg via INTRAVENOUS
  Filled 2024-11-02: qty 1

## 2024-11-02 MED ORDER — ONDANSETRON HCL 4 MG/2ML IJ SOLN: 4.0000 mg | Freq: Four times a day (QID) | INTRAMUSCULAR | Status: DC | PRN

## 2024-11-02 MED ORDER — POLYVINYL ALCOHOL 1.4 % OP SOLN: 1.0000 [drp] | Freq: Four times a day (QID) | OPHTHALMIC | Status: DC | PRN

## 2024-11-02 MED ORDER — ONDANSETRON 4 MG PO TBDP: 4.0000 mg | ORAL_TABLET | Freq: Four times a day (QID) | ORAL | Status: DC | PRN

## 2024-11-02 NOTE — Progress Notes (Addendum)
 " Progress Note   Patient: [name] FMW:[id number] DOB: 08-20-1927 DOA: 10/31/2024     2 DOS: the patient was seen and examined on 11/02/2024   Brief hospital course: 89yo with h/o chronic HFpEF, moderate AS, HTN, stage 3b CKD, ILD, and anemia who presented on 1/3 with SOB.  CXR with small pleural effusion, elevated proBNP, troponin 116.  Found to be in new afib, rate controlled.  Given Lasix , cardiology consulted.    Assessment & Plan Acute on chronic heart failure with preserved ejection fraction (HFpEF) (HCC) Acute hypoxic respiratory failure (HCC) Patient with known h/o chronic diastolic CHF (grade 1 DD in 05/2024) presenting with worsening SOB and hypoxia to 81% CXR with vascular congestion, probable bibasilar edema, small R and moderate L pleural effusion Elevated BNP  With elevated BNP and abnl CXR, acute decompensated CHF seems probable as diagnosis Admitted to telemetry in the setting of new afib Repeat echo shows acute systolic CHF with EF 40-45% with now grade 2 DD, likely associated with afib; also with moderate pericardial effusion without evidence of tamponade CHF order set utilized Was given Lasix  20 mg x 1 in ER and will repeat with 20 mg IV BID with plan to transition to PO Lasix  tomorrow Refusing pulse ox and Roann O2 currently but appears comfortable on RA Stable kidney function at this time, will follow Mildly elevated HS troponin is likely related to demand ischemia; doubt ACS based on symptoms and negative delta Cardiology is consulting Patient is extremely upset about being in the hospital; after palliative care consultation yesterday, she prefers to go home with hospice Unfortunately, her stability at home is in question New onset atrial fibrillation (HCC) Rate controlled without medication Started on low-dose metoprolol  by cardiology Will keep patient on heparin  infusion for now, although she does not appear to be a candidate for long-term  anticoagulation Consulted cardiology  Elevated troponin Likely associated with demand ischemia in the setting of CHF exacerbation Troponins are flat Denies any chest pain Cardiology consulted Echo as above Normocytic normochromic anemia Chronic disease/renal disease  Continue Integra Plus  Aortic stenosis Noted on echo in August 2025 Per Dr. Shlomo, patient was not seeking any active intervention She is unlikely to be a candidate for intervention Repeat echo with moderate AS Chronic kidney disease, stage 3b (HCC) Appears to be stable at this time Attempt to avoid nephrotoxic medications ILD (interstitial lung disease) (HCC) Followed by Dr. Shelah  Per late pulmonology note, patient's pulmonary function test showed restrictive lung disease which was showing response to albuterol , but the patient has not been using inhalers at home as per the patient's friend Chest x-ray does show worsening infiltrates likely from interstitial lung disease and CHF Given doxycycline  x 1 Stopped antibiotics given clinical presentation and negative procalcitonin MCI (mild cognitive impairment) Per patient's friend, patient has been having slowly progressive chronic cognitive decline CT head is unremarkable May need assistance at home since patient lives alone Significantly abnormal TSH which may be a factor She has become quite agitated and is now being given Seroquel  with safety sitter at bedside Psychiatry consulted Her capacity is in question at this time and it is not clear if she can safely go home with hospice If so, she can be discharged today Hypothyroidism Significantly elevated TSH, depressed free T4 Appears to have new hypothyroidism Will start Synthroid  50 mcg daily She will need f/u thyroid testing in 4-6 weeks Dyslipidemia Continue simvastatin  Essential hypertension On amlodipine  at home - will hold given possible  need for rate-controlling medication Continue valsartan  Stop low-dose  metoprolol  Elevated d-dimer She does have SOB with hypoxia  D-dimer is nonspecific Needs evaluation for PE Currently on heparin  drip given new afib With her stage 3b CKD, will order VQ scan DNR (do not resuscitate) Patient's nephew/HCPOA is Mr. [name] Crimes, lives in [place] , [phone].  Per patient's nephew, okay to treat medically Confirmed DNR status       Consultants: Cardiology   Procedures: Echocardiogram   Antibiotics: Doxycycline  1/5  30 Day Unplanned Readmission Risk Score    Flowsheet Row ED to Hosp-Admission (Current) from 10/31/2024 in [facility] 4TH FLOOR PROGRESSIVE CARE AND UROLOGY  30 Day Unplanned Readmission Risk Score (%) 13.4 Filed at 11/02/2024 0801    This score is the patient's risk of an unplanned readmission within 30 days of being discharged (0 -100%). The score is based on dignosis, age, lab data, medications, orders, and past utilization.   Low:  0-14.9   Medium: 15-21.9   High: 22-29.9   Extreme: 30 and above           Subjective: Extremely agitated today.  Refused evaluation.  Adamantly wants to go home and refusing care but also unable/unwilling to unless we are certain she will be able to care for herself safely at home.   Objective: Vitals:   11/02/24 1059 11/02/24 1305  BP:  136/76  Pulse:  77  Resp: 13 16  Temp:  97.8 F (36.6 C)  SpO2:  98%    Intake/Output Summary (Last 24 hours) at 11/02/2024 1441 Last data filed at 11/01/2024 1736 Gross per 24 hour  Intake 183.32 ml  Output --  Net 183.32 ml   Filed Weights   10/31/24 1648 11/01/24 0500 11/02/24 0500  Weight: 47.6 kg 49.1 kg 47.3 kg    Exam:  General:  Appears agitated but stable on RA Eyes:  normal lids, iris ENT:  grossly normal hearing, lips & tongue, mmm Cardiovascular:  Refused. No LE edema.  Respiratory:   Refused.  Normal respiratory effort on RA. Abdomen:  Refused Skin:  no rash or induration seen on limited exam Musculoskeletal:  no bony  abnormality on very limited exam Psychiatric:  agitated, combative, refusing exam, demanding to go home but also unwilling to say her name, where she is, or why she is here so capacity cannot be determined Neurologic:  unable to effectively perform  Data Reviewed: I have reviewed the patient's lab results since admission.  Pertinent labs for today include:   BUN 23/Creatinine 1.44/GFR 33, stable WBC 6.2 Hgb 9.7      Family Communication: None present; CW has been in touch with her nephew today, as has hospice liaison  Mobility: PT/OT Consulted and are recommending - Skilled Nursing-Short Term Rehab (<3 Hours/Day)11/01/2024 1340    Code Status: Limited: Do not attempt resuscitation (DNR) -DNR-LIMITED -Do Not Intubate/DNI    Disposition: Status is: Inpatient Remains inpatient appropriate because: arranging home hospice care/24 hour supervision     Time spent: 50 minutes  Unresulted Labs (From admission, onward)     Start     Ordered   11/02/24 0500  Heparin  level (unfractionated)  Daily,   R      11/01/24 0813   11/01/24 0500  CBC  Daily,   R      10/31/24 2200             Author: Delon Herald, MD 11/02/2024 2:41 PM  For on call review [email or web address].            "

## 2024-11-02 NOTE — TOC Initial Note (Addendum)
 Transition of Care [facility]) - Initial/Assessment Note    Patient Details  Name: [name] MRN: [id number] Date of Birth: [birth year]  [facility]) CM/SW Contact:    Tawni [name], LCSW Phone Number: 11/02/2024, 10:48 AM  Clinical Narrative:                  CSW received a consult for referral to hospice services. CSW spoke with the pts nephew, [name], to discuss the recommendation for home hospice services. The pts nephew chose [facility]. CSW sent a referral to [facility] for hospice services. The pts nephew reports that the pt lives at home alone and does not have any home services in place. CSW suggested a referral to [facility] to discuss private duty care, and the pts nephew is agreeable. He reports no DME needs at this time. He stated that the pts friend, Corean, will transport the pt home upon discharge. ICM to follow.    ADDEN 2:00pm CSW spoke with the pts nephew, [name], to provide an update. He stated that he and the pts caretaker are currently at work. CSW informed him that the pt is not oriented, has been agitated, and will need additional assistance when she returns home. The pts nephew stated that he will need time to arrange for someone to stay with her overnight. He inquired about how late the pt can be discharged. CSW informed him that the pt may be discharged at any time once care is arranged and transportation is available to pick up the pt. He stated that he will contact the pts caretaker and follow up with the pts RN. Per chart review, psychiatry has been consulted. ICM to follow.  Expected Discharge Plan: Home w Hospice Care Barriers to Discharge: Continued Medical Work up   Patient Goals and CMS Choice Patient states their goals for this hospitalization and ongoing recovery are:: retrun home CMS Medicare.gov Compare Post Acute Care list provided to:: Patient Represenative (must comment) Choice offered to / list presented  to : [facility] POA / Guardian      Expected Discharge Plan and Services       Living arrangements for the past 2 months: Single Family Home                                      Prior Living Arrangements/Services Living arrangements for the past 2 months: Single Family Home Lives with:: Self Patient language and need for interpreter reviewed:: Yes Do you feel safe going back to the place where you live?: Yes      Need for Family Participation in Patient Care: Yes (Comment)     Criminal Activity/Legal Involvement Pertinent to Current Situation/Hospitalization: No - Comment as needed  Activities of Daily Living   ADL Screening (condition at time of admission) Independently performs ADLs?: Yes (appropriate for developmental age) Is the patient deaf or have difficulty hearing?: Yes Does the patient have difficulty seeing, even when wearing glasses/contacts?: No Does the patient have difficulty concentrating, remembering, or making decisions?: Yes  Permission Sought/Granted                  Emotional Assessment Appearance:: Appears stated age     Orientation: : Oriented to Self   Psych Involvement: No (comment)  Admission diagnosis:  New onset atrial fibrillation (HCC) [I48.91] Acute CHF (congestive heart failure) (HCC) [I50.9] Acute on chronic right-sided congestive  heart failure (HCC) [I50.813] Patient Active Problem List   Diagnosis Date Noted   Acute on chronic heart failure with preserved ejection fraction (HFpEF) (HCC) 11/01/2024   ILD (interstitial lung disease) (HCC) 11/01/2024   MCI (mild cognitive impairment) 11/01/2024   DNR (do not resuscitate) 11/01/2024   Hypothyroidism 11/01/2024   Dyslipidemia 11/01/2024   Essential hypertension 11/01/2024   Elevated d-dimer 11/01/2024   Acute CHF (congestive heart failure) (HCC) 10/31/2024   Chronic kidney disease, stage 3b (HCC) 10/31/2024   New onset atrial fibrillation (HCC) 10/31/2024   Elevated troponin  10/31/2024   Aortic stenosis    Impacted cerumen of both ears 02/03/2024   Normocytic normochromic anemia 09/18/2023   S/P shoulder replacement, 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   PCP:  Okey Carlin Redbird, MD Pharmacy:   [id number] - [place], [place] - [address] AT [facility] [address] [place] [address] Phone: [phone] Fax: [phone]  [facility] - [place], [place] - [address] [place] [address] Phone: [phone] Fax: [phone]     Social Drivers of Health (SDOH) Social History: SDOH Screenings   Food Insecurity: Patient Unable To Answer (10/31/2024)  Housing: Unknown (10/31/2024)  Transportation Needs: Patient Unable To Answer (10/31/2024)  Utilities: Patient Unable To Answer (10/31/2024)  Social Connections: Unknown (10/31/2024)  Tobacco Use: Low Risk (10/31/2024)   SDOH Interventions:     Readmission Risk Interventions     No data to display

## 2024-11-02 NOTE — Assessment & Plan Note (Addendum)
 On amlodipine  at home - will hold given possible need for rate-controlling medication Continue valsartan  Stop low-dose metoprolol 

## 2024-11-02 NOTE — Assessment & Plan Note (Addendum)
 She does have SOB with hypoxia  D-dimer is nonspecific Needs evaluation for PE Currently on heparin  drip given new afib With her stage 3b CKD, will order VQ scan

## 2024-11-02 NOTE — Assessment & Plan Note (Addendum)
 Significantly elevated TSH, depressed free T4 Appears to have new hypothyroidism Will start Synthroid  50 mcg daily She will need f/u thyroid testing in 4-6 weeks

## 2024-11-02 NOTE — Assessment & Plan Note (Addendum)
 Chronic disease/renal disease  Continue Integra Plus 

## 2024-11-02 NOTE — Progress Notes (Signed)
 "  Rounding Note   Patient Name: [name] Date of Encounter: 11/02/2024  [facility] Cardiologist: Wilbert Bihari, MD   Subjective She denies CP or dyspnea  Scheduled Meds:  furosemide   20 mg Intravenous Q12H   irbesartan   300 mg Oral Daily   levothyroxine   50 mcg Oral Q0600   metoprolol  tartrate  12.5 mg Oral BID   multivitamin with minerals  1 tablet Oral Daily   simvastatin   20 mg Oral q1800   Continuous Infusions:  heparin  550 Units/hr (11/01/24 1736)   PRN Meds: acetaminophen  **OR** acetaminophen    Vital Signs  Vitals:   11/01/24 1004 11/01/24 2145 11/02/24 0500 11/02/24 0521  BP: (!) 140/76 (!) 154/67  (!) 158/69  Pulse: 99 78  74  Resp: (!) 22 16  16   Temp: 98.7 F (37.1 C) 98.2 F (36.8 C)  98.4 F (36.9 C)  TempSrc: Oral   Oral  SpO2: 96% (!) 89%  90%  Weight:   47.3 kg   Height:        Intake/Output Summary (Last 24 hours) at 11/02/2024 0953 Last data filed at 11/01/2024 1736 Gross per 24 hour  Intake 663.32 ml  Output --  Net 663.32 ml      11/02/2024    5:00 AM 11/01/2024    5:00 AM 10/31/2024    4:48 PM  Last 3 Weights  Weight (lbs) 104 lb 4.4 oz 108 lb 3.9 oz 105 lb  Weight (kg) 47.3 kg 49.1 kg 47.628 kg      Telemetry Atrial fibrillation converting to sinus rhythm with first-degree AV block- Personally Reviewed    Physical Exam  GEN: No acute distress.  Frail Neck: No JVD Cardiac: Regular rate and rhythm Respiratory: Clear to auscultation bilaterally. GI: Soft, nontender, non-distended  MS: trace to 1+ edema; No deformity. Neuro:  Nonfocal  Psych: Normal affect   Labs  Recent Labs  Lab 10/31/24 1811 10/31/24 2052  TRNPT 116* 106*       Chemistry Recent Labs  Lab 10/31/24 1811 11/01/24 0240 11/02/24 0424  NA 134* 134* 135  K 4.2 4.1 3.7  CL 99 100 98  CO2 23 23 27   GLUCOSE 113* 91 90  BUN 22 21 23   CREATININE 1.26* 1.25* 1.44*  CALCIUM 9.0 8.5* 8.6*  MG  --  2.0  --   PROT 7.1 6.1*  --   ALBUMIN 4.1 3.5   --   AST 35 31  --   ALT 20 15  --   ALKPHOS 78 63  --   BILITOT 0.4 0.4  --   GFRNONAA 39* 39* 33*  ANIONGAP 12 12 11       Hematology Recent Labs  Lab 10/31/24 1811 11/01/24 0240 11/02/24 0424  WBC 6.8 5.8 6.2  RBC 3.99 3.50* 3.36*  HGB 11.7* 10.3* 9.7*  HCT 36.5 32.0* 30.5*  MCV 91.5 91.4 90.8  MCH 29.3 29.4 28.9  MCHC 32.1 32.2 31.8  RDW 15.1 15.1 15.1  PLT 316 275 258   Thyroid  Recent Labs  Lab 11/01/24 0240  TSH 47.800*  FREET4 0.77*    BNP Recent Labs  Lab 10/31/24 1811  PROBNP 10,738.0*    DDimer  Recent Labs  Lab 11/01/24 0240  DDIMER 2.27*     Radiology  ECHOCARDIOGRAM COMPLETE Result Date: 11/01/2024    ECHOCARDIOGRAM REPORT   Patient Name:   [name] Date of Exam: 11/01/2024 Medical Rec #:  [id number]  Height:       61.0 in Accession #:    [id number]      Weight:       108.2 lb Date of Birth:  Dec 05, 1926       BSA:          1.455 m Patient Age:    89 years        BP:           140/76 mmHg Patient Gender: F               HR:           63 bpm. Exam Location:  Inpatient Procedure: 2D Echo, Cardiac Doppler and Color Doppler (Both Spectral and Color            Flow Doppler were utilized during procedure). Indications:    Aortic stenosis I35.0                 CHF-Acute Diastolic I50.31                 Atrial Fibrillation I48.91  History:        Patient has prior history of Echocardiogram examinations, most                 recent 06/03/2024. Signs/Symptoms:Murmur; Risk                 Factors:Hypertension.  Sonographer:    Sydnee Wilson RDCS Referring Phys: [id number] TAYLOR A PARCELLS IMPRESSIONS  1. Left ventricular ejection fraction, by estimation, is 40 to 45%. The left ventricle has mildly decreased function. The left ventricle demonstrates global hypokinesis. There is mild left ventricular hypertrophy. Left ventricular diastolic parameters are consistent with Grade II diastolic dysfunction (pseudonormalization).  2. Right ventricular systolic function is  normal. The right ventricular size is normal. There is mildly elevated pulmonary artery systolic pressure. The estimated right ventricular systolic pressure is 42.9 mmHg.  3. Left atrial size was moderately dilated.  4. Moderate pericardial effusion. The pericardial effusion is posterior to the left ventricle and the left atrium. There is no evidence of cardiac tamponade. Moderate pleural effusion in the left lateral region.  5. The mitral valve is normal in structure. Trivial mitral valve regurgitation. No evidence of mitral stenosis.  6. The aortic valve is tricuspid. There is moderate calcification of the aortic valve. There is moderate thickening of the aortic valve. Aortic valve regurgitation is trivial. Mild aortic valve stenosis. Aortic valve mean gradient measures 11.0 mmHg. Aortic valve Vmax measures 2.08 m/s.  7. The inferior vena cava is normal in size with greater than 50% respiratory variability, suggesting right atrial pressure of 3 mmHg. FINDINGS  Left Ventricle: Left ventricular ejection fraction, by estimation, is 40 to 45%. The left ventricle has mildly decreased function. The left ventricle demonstrates global hypokinesis. The left ventricular internal cavity size was normal in size. There is  mild left ventricular hypertrophy. Left ventricular diastolic parameters are consistent with Grade II diastolic dysfunction (pseudonormalization). Right Ventricle: The right ventricular size is normal. No increase in right ventricular wall thickness. Right ventricular systolic function is normal. There is mildly elevated pulmonary artery systolic pressure. The tricuspid regurgitant velocity is 3.16  m/s, and with an assumed right atrial pressure of 3 mmHg, the estimated right ventricular systolic pressure is 42.9 mmHg. Left Atrium: Left atrial size was moderately dilated. Right Atrium: Right atrial size was normal in size. Pericardium: A moderately sized pericardial effusion is present. The pericardial  effusion is  posterior to the left ventricle and the left atrium. There is no evidence of cardiac tamponade. Mitral Valve: The mitral valve is normal in structure. Trivial mitral valve regurgitation. No evidence of mitral valve stenosis. Tricuspid Valve: The tricuspid valve is normal in structure. Tricuspid valve regurgitation is mild . No evidence of tricuspid stenosis. Aortic Valve: The aortic valve is tricuspid. There is moderate calcification of the aortic valve. There is moderate thickening of the aortic valve. Aortic valve regurgitation is trivial. Mild aortic stenosis is present. Aortic valve mean gradient measures 11.0 mmHg. Aortic valve peak gradient measures 17.3 mmHg. Aortic valve area, by VTI measures 1.39 cm. Pulmonic Valve: The pulmonic valve was normal in structure. Pulmonic valve regurgitation is trivial. No evidence of pulmonic stenosis. Aorta: The aortic root is normal in size and structure. Venous: The inferior vena cava is normal in size with greater than 50% respiratory variability, suggesting right atrial pressure of 3 mmHg. IAS/Shunts: No atrial level shunt detected by color flow Doppler. Additional Comments: There is a moderate pleural effusion in the left lateral region.  LEFT VENTRICLE PLAX 2D LVIDd:         4.20 cm     Diastology LVIDs:         3.60 cm     LV e' medial:    4.57 cm/s LV PW:         1.20 cm     LV E/e' medial:  20.4 LV IVS:        0.80 cm     LV e' lateral:   6.74 cm/s LVOT diam:     2.00 cm     LV E/e' lateral: 13.9 LV SV:         80 LV SV Index:   55 LVOT Area:     3.14 cm  LV Volumes (MOD) LV vol d, MOD A2C: 82.9 ml LV vol d, MOD A4C: 60.0 ml LV vol s, MOD A2C: 36.6 ml LV vol s, MOD A4C: 34.3 ml LV SV MOD A2C:     46.3 ml LV SV MOD A4C:     60.0 ml LV SV MOD BP:      33.3 ml RIGHT VENTRICLE RV S prime:     6.31 cm/s TAPSE (M-mode): 1.0 cm LEFT ATRIUM             Index        RIGHT ATRIUM           Index LA diam:        4.50 cm 3.09 cm/m   RA Area:     14.90 cm LA Vol  (A2C):   64.0 ml 43.99 ml/m  RA Volume:   37.10 ml  25.50 ml/m LA Vol (A4C):   33.2 ml 22.82 ml/m LA Biplane Vol: 50.1 ml 34.43 ml/m  AORTIC VALVE AV Area (Vmax):    1.47 cm AV Area (Vmean):   1.40 cm AV Area (VTI):     1.39 cm AV Vmax:           208.00 cm/s AV Vmean:          159.667 cm/s AV VTI:            0.576 m AV Peak Grad:      17.3 mmHg AV Mean Grad:      11.0 mmHg LVOT Vmax:         97.60 cm/s LVOT Vmean:        71.333 cm/s LVOT VTI:  0.254 m LVOT/AV VTI ratio: 0.44  AORTA Ao Root diam: 2.50 cm Ao Asc diam:  2.70 cm MITRAL VALVE               TRICUSPID VALVE MV Area (PHT): 4.31 cm    TR Peak grad:   39.9 mmHg MV E velocity: 93.40 cm/s  TR Vmax:        316.00 cm/s MV A velocity: 33.40 cm/s MV E/A ratio:  2.80        SHUNTS                            Systemic VTI:  0.25 m                            Systemic Diam: 2.00 cm Oneil Parchment MD Electronically signed by Oneil Parchment MD Signature Date/Time: 11/01/2024/4:21:00 PM    Final    CT Head Wo Contrast Result Date: 10/31/2024 EXAM: CT HEAD WITHOUT CONTRAST 10/31/2024 05:41:59 PM TECHNIQUE: CT of the head was performed without the administration of intravenous contrast. Automated exposure control, iterative reconstruction, and/or weight based adjustment of the mA/kV was utilized to reduce the radiation dose to as low as reasonably achievable. COMPARISON: CT head without contrast 05/11/2022. CLINICAL HISTORY: Mental status change, unknown cause. Decreased appetite and increased memory loss. FINDINGS: BRAIN AND VENTRICLES: Moderate chronic microvascular ischemic change. Generalized volume loss without lobar predominance, consistent with moderate atrophy. White matter changes demonstrate slight progression since the prior exam. No acute hemorrhage. No evidence of acute infarct. No hydrocephalus. No extra-axial collection. No mass effect or midline shift. ORBITS: Bilateral lens replacement noted. SINUSES: Air-fluid level in right sphenoid sinus,  compatible with acute sinusitis. SOFT TISSUES AND SKULL: No acute soft tissue abnormality. No skull fracture. IMPRESSION: 1. No acute intracranial abnormality. 2. Moderate chronic microvascular ischemic change and generalized volume loss without lobar predominance, slightly progressed. 3. Right sphenoid sinus air-fluid level, consistent with acute sinusitis. Electronically signed by: Lonni Necessary MD 10/31/2024 06:02 PM EST RP Workstation: [id number]   DG Chest Portable 1 View Result Date: 10/31/2024 CLINICAL DATA:  Shortness of breath.  Bilateral pitting edema. EXAM: PORTABLE CHEST 1 VIEW COMPARISON:  06/14/2024. FINDINGS: Heart is enlarged and the mediastinal contour stable. Atherosclerotic calcification of the aorta is noted. The pulmonary vasculature is distended. Interstitial prominence is noted bilaterally and increased from the prior exams. There is a small right pleural effusion and moderate left pleural effusion with a associated atelectasis or infiltrate. No pneumothorax is seen. Shoulder arthroplasty changes are noted on the right IMPRESSION: 1. Cardiomegaly with pulmonary vascular congestion. 2. Increased interstitial prominence bilaterally with airspace disease at the lung bases, possible edema or infiltrate. 3. Small right pleural effusion and moderate left pleural effusion. Electronically Signed   By: Leita Birmingham M.D.   On: 10/31/2024 17:35      Patient Profile   89 y.o. female with past medical history of moderate aortic stenosis, heart failure preserved ejection fraction, chronic stage IIIb kidney disease, hypertension, hyperlipidemia, interstitial lung disease for evaluation of acute on chronic congestive heart failure and new onset atrial fibrillation.  Echocardiogram this admission shows ejection fraction 40 to 45%, moderate left atrial enlargement, moderate pericardial effusion, moderate left pleural effusion, mild aortic stenosis with mean gradient 11 mmHg and trace aortic  insufficiency.  Assessment & Plan   1 acute on chronic heart failure preserved ejection fraction-volume status with  some improvement.  Continue Lasix  20 mg IV twice daily today and changed to oral Lasix  tomorrow.     2 new onset atrial fibrillation-patient has converted to sinus rhythm.  Change metoprolol  to Toprol .  CHA2DS2-VASc is at least 5.  As outlined previously issue of anticoagulation is complicated.  She is extremely frail and has fallen at home.  I therefore do not think long-term anticoagulation is appropriate.  I feel risk outweighs benefit.     3 Hypertension-continue present blood pressure medications.     4 Moderate aortic stenosis-history of moderate aortic stenosis-patient would not be a good candidate for TAVR in the future.   5 elevated TSH-markedly elevated TSH.  Further therapy per primary care.  For questions or updates, please contact [facility] Please consult [email or web address] for contact info under   Signed, Redell Shallow, MD  11/02/2024, 9:53 AM    "

## 2024-11-02 NOTE — Progress Notes (Signed)
 PHARMACY - ANTICOAGULATION CONSULT NOTE  Pharmacy Consult for Heparin  Indication: atrial fibrillation  Allergies[1]  Patient Measurements: Height: 5' 1 (154.9 cm) Weight: 47.3 kg (104 lb 4.4 oz) IBW/kg (Calculated) : 47.8 HEPARIN  DW (KG): 47.6  Vital Signs: Temp: 98.4 F (36.9 C) (01/06 0521) Temp Source: Oral (01/06 0521) BP: 158/69 (01/06 0521) Pulse Rate: 74 (01/06 0521)  Labs: Recent Labs    10/31/24 1811 11/01/24 0240 11/01/24 0702 11/01/24 1458 11/02/24 0424  HGB 11.7* 10.3*  --   --  9.7*  HCT 36.5 32.0*  --   --  30.5*  PLT 316 275  --   --  258  HEPARINUNFRC  --   --  0.34 0.45 0.38  CREATININE 1.26* 1.25*  --   --  1.44*    Estimated Creatinine Clearance: 17.5 mL/min (A) (by C-G formula based on SCr of 1.44 mg/dL (H)).   Medical History: Past Medical History:  Diagnosis Date   Aortic stenosis    Moderate by echo 05/2024   Arthritis    Chronic kidney disease, stage 3b (HCC)    GERD (gastroesophageal reflux disease)    occ tums   Heart murmur    High cholesterol    Hyperlipemia    Hypertension    Osteoarthritis    Sleep disturbance    Vitamin D  deficiency     Medications:  Infusions:   heparin  550 Units/hr (11/01/24 1736)    Assessment: 89 yo F with new onset Afib.   Not on anticoagulation PTA.  CHAS2VASc=5.   Pharmacy consulted to start IV heparin .    -Heparin  level 0.38 - remains therapeutic with heparin  infusing at 550 units/hr -Hgb/plt slight trend down -No complications of therapy noted  Goal of Therapy:  Heparin  level 0.3-0.7 units/ml Monitor platelets by anticoagulation protocol: Yes   Plan:  -Continue heparin  infusion at 550 units/hr -Daily CBC and heparin  level -Follow up plan of care - appears patient may not be a great candidate for long term anticoagulation, will continue to follow   Stefano MARLA Bologna, PharmD, BCPS Clinical Pharmacist 11/02/2024 7:20 AM        [1]  Allergies Allergen Reactions   Codeine  Other (See Comments)    Insomnia

## 2024-11-02 NOTE — Consult Note (Signed)
" °  Reason for Consult: Evaluation for agitation/combative behavior and assessment of capacity to return home.  Chart Review / Interval History: Chart reviewed. Patient is a 89 year old female who presented with small pleural effusion, elevated pro-BNP, and new-onset atrial fibrillation. During hospitalization, she was noted to have agitation and intermittent combative behaviors, prompting a psychiatry site consult.  It was confirmed that the patient has a healthcare power of attorney in place. The designated surrogate was contacted and provided consent for medical treatment and confirmed the patients DNR status. Following this determination, the medical team transitioned the patient to comfort-focused care.  Capacity Determination: Although the patient appeared disoriented and lacked capacity to make informed medical decisions at the time, decision-making authority was appropriately assumed by the healthcare power of attorney.  Disposition: Given that goals of care were clarified, comfort measures were implemented, and surrogate decision-making was established, the psychiatry site consult was discontinued and not completed.  Assessment / Plan:  Patient lacks medical decision-making capacity at this time  Healthcare power of attorney appropriately engaged  Goals of care established and comfort measures initiated  No further psychiatric intervention indicated at this time  Psychiatry consult service to sign off "

## 2024-11-02 NOTE — Progress Notes (Signed)
 "                                                                                                                                                         Daily Progress Note   Patient Name: [name]       Date: 11/02/2024 DOB: February 19, 1927  Age: 89 y.o. MRN#: [id number] Attending Physician: Barbarann Nest, MD Primary Care Physician: Okey Carlin Redbird, MD Admit Date: 10/31/2024  Reason for Consultation/Follow-up: Establishing goals of care  Subjective: Medical records reviewed including progress notes, labs, imaging. Patient assessed at the bedside.  She was resting peacefully after receiving as needed Haldol .  Discussed with RN and recruitment consultant.  Unfortunately, patient has been very agitated throughout most of the day.  No visitors present during my assessment.  Called patient's nephew [name] to follow-up for continued goals of care discussions.  Debriefed his conversation with social worker earlier today and his anticipation for discharge home on hospice either today or tomorrow, pending his ability to coordinate with patient's caregiver and any private duty caregivers to care for her overnight.  He is understandably very overwhelmed, also dealing with other family that need his support.  We discussed plan for psychiatric evaluation today.  I shared my recommendation for comfort focused care given her symptom burden and agitation.  Reviewed that patient would no longer receive aggressive medical interventions such as continuous vital signs, lab work, radiology testing, or medications not focused on comfort. All care would focus on how the patient is looking and feeling. This would include management of any symptoms that may cause discomfort, pain, shortness of breath, cough, nausea, agitation, anxiety, and/or secretions etc. Symptoms would be managed with medications and other non-pharmacological interventions such as spiritual support if requested, repositioning, music therapy, or therapeutic  listening. Family verbalized understanding and appreciation.  He is in agreement with full comfort focused care while coordinating for hospice at home.  I briefly offered option of hospice facility for patient's uncontrolled symptoms.  He declined, stating the patient would not be in agreement.  Questions and concerns addressed. PMT will continue to support holistically.   Length of Stay: 2   Physical Exam Vitals and nursing note reviewed.  Constitutional:      General: She is not in acute distress.    Appearance: She is ill-appearing.  HENT:     Head: Normocephalic and atraumatic.  Cardiovascular:     Rate and Rhythm: Normal rate.  Pulmonary:     Effort: Pulmonary effort is normal.  Neurological:     Comments: Did not arouse to assess  Psychiatric:        Cognition and Memory: Cognition is impaired.            Vital Signs: BP (!) 158/69 (BP Location: Left Arm)   Pulse 74  Temp 98.4 F (36.9 C) (Oral)   Resp 16   Ht 5' 1 (1.549 m)   Wt 47.3 kg   SpO2 90%   BMI 19.70 kg/m  SpO2: SpO2: 90 % O2 Device: O2 Device: Room Air O2 Flow Rate: O2 Flow Rate (L/min): 4 L/min       Palliative Care Assessment & Plan   Patient Profile: 89 y.o. female  with past medical history significant of chronic heart failure preserved ejection fraction, moderate aortic stenosis.  2D echo done in August 2025, hypertension, chronic kidney disease stage III, and anemia admitted on 10/31/2024 with shortness of breath with minimal exertion x 5 days.  Patient does have chronic lower extremity edema and had followed up with cardiology Dr. Shlomo who had placed her on Lasix  but medication reconciliation does not show if patient is still taking Lasix .  During the evaluation patient denies any chest pain, productive cough, fever and chills.  Patient's friend who brought her to the hospital also noted that patient has progressively had some cognitive decline.   Patient is also following with pulmonologist  for chronic interstitial lung disease with PFT showing restrictive lung disease which was responsive to albuterol .   In the ED, chest x-ray shows pleural effusion, with proBNP of 10,000 and elevated troponin of 116 which remained flat.  Chest x-ray also shows increased interstitial infiltrates though patient does not have any productive cough or fever or chills or leukocytosis.  EKG was showing atrial fibrillation which is new onset rate controlled.  CT head was unremarkable.  Patient was given 20 mg of IV Lasix  and admitted for further workup.   PMT has been consulted to assist with goals of care conversation. Patient/Family face treatment option decisions, advanced directive decisions and anticipatory care needs.   Assessment: Goals of care conversation MCI  Delirium and agitation, worsening Acute on chronic HFpEF A-fib, rate controlled ILD  Recommendations/Plan: Continue DNR/DNI Transition to comfort focused care today Morphine  PRN for pain/air hunger/comfort Robinul  PRN for excessive secretions Ativan  PRN for agitation/anxiety Zofran  PRN for nausea Liquifilm tears PRN for dry eyes Haldol  PRN for agitation/anxiety May have comfort feeding Comfort cart for family Unrestricted visitations in the setting of EOL (per policy) Oxygen PRN 2L or less for comfort. No escalation.  Goal remains to discharge home with hospice.  Patient's nephew continues to attempt coordinating caregivers Psychosocial and emotional support provided PMT will continue to follow and support   Prognosis:  < 6 months  Discharge Planning: Home with Hospice   Care plan was discussed with patient's RN, sitter, MD, and nephew [name] Mickle SHAUNNA Wonda DEVONNA  Palliative Medicine Team Team phone # [phone]  Thank you for allowing the Palliative Medicine Team to assist in the care of this patient. Please utilize secure chat with additional questions, if there is no response within 30 minutes please  call the above phone number.  Palliative Medicine Team providers are available by phone from 7am to 7pm daily and can be reached through the team cell phone.  Should this patient require assistance outside of these hours, please call the patient's attending physician.    Billing based on MDM: high  Problems Addressed: One acute or chronic illness or injury that poses a threat to life or bodily function  Amount and/or Complexity of Data: Category 1:Review of prior external note(s) from each unique source, Review of the result(s) of each unique test, and Assessment requiring an independent historian(s), Category 2:Independent interpretation of a test  performed by another physician/other qualified health care professional (not separately reported), and Category 3:Discussion of management or test interpretation with external physician/other qualified health care professional/appropriate source (not separately reported)  Risks: Parenteral controlled substances and Decision not to resuscitate or to de-escalate care because of poor prognosis  "

## 2024-11-02 NOTE — Assessment & Plan Note (Addendum)
-  Appears to be stable at this time -Attempt to avoid nephrotoxic medications

## 2024-11-02 NOTE — Assessment & Plan Note (Signed)
 Patient with known h/o chronic diastolic CHF (grade 1 DD in 05/2024) presenting with worsening SOB and hypoxia to 81% CXR with vascular congestion, probable bibasilar edema, small R and moderate L pleural effusion Elevated BNP  With elevated BNP and abnl CXR, acute decompensated CHF seems probable as diagnosis Admitted to telemetry in the setting of new afib Repeat echo shows acute systolic CHF with EF 40-45% with now grade 2 DD, likely associated with afib; also with moderate pericardial effusion without evidence of tamponade CHF order set utilized Was given Lasix  20 mg x 1 in ER and will repeat with 20 mg IV BID with plan to transition to PO Lasix  tomorrow Refusing pulse ox and [place] O2 currently but appears comfortable on RA Stable kidney function at this time, will follow Mildly elevated HS troponin is likely related to demand ischemia; doubt ACS based on symptoms and negative delta Cardiology is consulting Patient is extremely upset about being in the hospital; after palliative care consultation yesterday, she prefers to go home with hospice Unfortunately, her stability at home is in question

## 2024-11-02 NOTE — Assessment & Plan Note (Addendum)
 Rate controlled without medication Started on low-dose metoprolol  by cardiology Will keep patient on heparin  infusion for now, although she does not appear to be a candidate for long-term anticoagulation Consulted cardiology

## 2024-11-02 NOTE — Assessment & Plan Note (Addendum)
 Likely associated with demand ischemia in the setting of CHF exacerbation Troponins are flat Denies any chest pain Cardiology consulted Echo as above

## 2024-11-02 NOTE — Assessment & Plan Note (Addendum)
 Patient's nephew/HCPOA is Mr. [name] Crimes, lives in [place] , [phone].  Per patient's nephew, okay to treat medically Confirmed DNR status

## 2024-11-02 NOTE — Assessment & Plan Note (Addendum)
"   Continue simvastatin          "

## 2024-11-02 NOTE — Assessment & Plan Note (Addendum)
 Followed by Dr. Shelah  Per late pulmonology note, patient's pulmonary function test showed restrictive lung disease which was showing response to albuterol , but the patient has not been using inhalers at home as per the patient's friend Chest x-ray does show worsening infiltrates likely from interstitial lung disease and CHF Given doxycycline  x 1 Stopped antibiotics given clinical presentation and negative procalcitonin

## 2024-11-02 NOTE — Assessment & Plan Note (Addendum)
 Per patient's friend, patient has been having slowly progressive chronic cognitive decline CT head is unremarkable May need assistance at home since patient lives alone Significantly abnormal TSH which may be a factor She has become quite agitated and is now being given Seroquel  with safety sitter at bedside Psychiatry consulted Her capacity is in question at this time and it is not clear if she can safely go home with hospice If so, she can be discharged today

## 2024-11-02 NOTE — Assessment & Plan Note (Addendum)
 Noted on echo in August 2025 Per Dr. Shlomo, patient was not seeking any active intervention She is unlikely to be a candidate for intervention Repeat echo with moderate AS

## 2024-11-02 NOTE — Assessment & Plan Note (Addendum)
 Patient with known h/o chronic diastolic CHF (grade 1 DD in 05/2024) presenting with worsening SOB and hypoxia to 81% CXR with vascular congestion, probable bibasilar edema, small R and moderate L pleural effusion Elevated BNP  With elevated BNP and abnl CXR, acute decompensated CHF seems probable as diagnosis Admitted to telemetry in the setting of new afib Repeat echo shows acute systolic CHF with EF 40-45% with now grade 2 DD, likely associated with afib; also with moderate pericardial effusion without evidence of tamponade CHF order set utilized Was given Lasix  20 mg x 1 in ER and will repeat with 20 mg IV BID with plan to transition to PO Lasix  tomorrow Refusing pulse ox and [place] O2 currently but appears comfortable on RA Stable kidney function at this time, will follow Mildly elevated HS troponin is likely related to demand ischemia; doubt ACS based on symptoms and negative delta Cardiology is consulting Patient is extremely upset about being in the hospital; after palliative care consultation yesterday, she prefers to go home with hospice Unfortunately, her stability at home is in question

## 2024-11-03 DIAGNOSIS — I480 Paroxysmal atrial fibrillation: Secondary | ICD-10-CM | POA: Diagnosis not present

## 2024-11-03 DIAGNOSIS — N1832 Chronic kidney disease, stage 3b: Secondary | ICD-10-CM | POA: Diagnosis not present

## 2024-11-03 DIAGNOSIS — I5033 Acute on chronic diastolic (congestive) heart failure: Secondary | ICD-10-CM | POA: Diagnosis not present

## 2024-11-03 DIAGNOSIS — J849 Interstitial pulmonary disease, unspecified: Secondary | ICD-10-CM | POA: Diagnosis not present

## 2024-11-03 DIAGNOSIS — G3184 Mild cognitive impairment, so stated: Secondary | ICD-10-CM | POA: Diagnosis not present

## 2024-11-03 DIAGNOSIS — R7989 Other specified abnormal findings of blood chemistry: Secondary | ICD-10-CM

## 2024-11-03 DIAGNOSIS — I350 Nonrheumatic aortic (valve) stenosis: Secondary | ICD-10-CM | POA: Diagnosis not present

## 2024-11-03 DIAGNOSIS — J9601 Acute respiratory failure with hypoxia: Secondary | ICD-10-CM

## 2024-11-03 DIAGNOSIS — Z66 Do not resuscitate: Secondary | ICD-10-CM | POA: Diagnosis not present

## 2024-11-03 DIAGNOSIS — D649 Anemia, unspecified: Secondary | ICD-10-CM

## 2024-11-03 DIAGNOSIS — I509 Heart failure, unspecified: Secondary | ICD-10-CM | POA: Diagnosis not present

## 2024-11-03 DIAGNOSIS — E785 Hyperlipidemia, unspecified: Secondary | ICD-10-CM

## 2024-11-03 DIAGNOSIS — Z515 Encounter for palliative care: Secondary | ICD-10-CM | POA: Diagnosis not present

## 2024-11-03 MED ORDER — METOPROLOL SUCCINATE ER 25 MG PO TB24: 25.0000 mg | ORAL_TABLET | Freq: Every day | ORAL | Status: DC

## 2024-11-03 MED ORDER — ADULT MULTIVITAMIN W/MINERALS CH: 1.0000 | ORAL_TABLET | Freq: Every day | ORAL | Status: DC

## 2024-11-03 MED ORDER — SIMVASTATIN 20 MG PO TABS: 20.0000 mg | ORAL_TABLET | Freq: Every day | ORAL | Status: DC

## 2024-11-03 NOTE — Plan of Care (Signed)

## 2024-11-03 NOTE — Progress Notes (Signed)
 [id number] [facility] Liaison Note  Received a request from Care Manager for hospice services at home after discharge. Spoke with nephew [name] to initiate education related to hospice philosophy, services and team approach to care. Robbie verbalized understanding of information given. Per discussion, [name] is trying to locate legal documents so that decisions can be made by legal family representatives regarding additional home care support.  Liaison to follow up with [facility] and provide assistance as needed.  Please call with any questions or concerns.  Thank you for the opportunity to participate in this patient's care.  Inocente Jacobs, BSN, RN [employer] [phone]

## 2024-11-03 NOTE — Progress Notes (Signed)
 Heart Failure Navigator Progress Note  Assessed for Heart & Vascular TOC clinic readiness.  Patient does not meet criteria due to per MD note , plan is for Hospice services at discharge. No HF TOC. .   Navigator will sign off at this time.   Stephane Haddock, BSN, Scientist, Clinical (histocompatibility And Immunogenetics) Only

## 2024-11-03 NOTE — Progress Notes (Signed)
 "                                                                                                                                                         Daily Progress Note   Patient Name: [name]       Date: 11/03/2024 DOB: 1927-05-19  Age: 89 y.o. MRN#: [id number] Attending Physician: Kathrin Mignon DASEN, MD Primary Care Physician: Okey Carlin Redbird, MD Admit Date: 10/31/2024  Reason for Consultation/Follow-up: Establishing goals of care  Subjective: Her nephew [name] and niece [name] were visiting after driving from out of state overnight. They are disappointed that she is sedated, as they requested at 5am not to give more sedating medications. They understand that she may still be experiencing effects of PRN haldol  from 453. They would like to be called as soon as patient is alert, requesting that NO additional sedating medications are administered for agitation, but rather patient is notified that they are in town. This should calm her down. They share that facetiming them while they are on their way back may help calm her down until they arrive as well (only 10 minutes from the hospital).   Reviewed current care plan and explored caregiver [name]'s concerns about care. Offered to reorder home medications which they would like. Risks and benefits of restarting IV heparin  were discussed. Patient's nephew and niece declined. Overall focus remains comfort. Counseled on lack of benefit from medications for HR, cholesterol, etc. At this point.   Disposition was also discussed. They are unsure how to pay for private caregivers without accessing patient's funds, and the lawyer will not speak to them without permission from their estranged cousin [name], patient's other nephew. Requested I call [name] to ask for permission to speak with attorney, which I did and left a voicemail. Emotional support and therapeutic listening was provided as they described complicated family dynamics with previous deaths  in the family.   Attempted to call [name] per her request to nursing last night but was unable to reach. Left voicemail with PMT contact information.  Questions and concerns addressed. PMT will continue to support holistically.  Objective: Medical records reviewed including progress notes, MAR. Patient received 3 doses of PRN haldol  and 2 doses of PRN ativan  in the past 24 hours.   Patient was unresponsive to voice or touch, breathing comfortably with [place] in place. Occasional making sounds and grimacing.  Length of Stay: 3   Physical Exam Vitals and nursing note reviewed.  Constitutional:      General: She is not in acute distress.    Appearance: She is ill-appearing.  HENT:     Head: Normocephalic and atraumatic.  Cardiovascular:     Rate and Rhythm: Normal rate.  Pulmonary:     Effort: Pulmonary effort is normal. No respiratory distress.  Skin:    General: Skin is cool and dry.  Neurological:     Mental Status: She is unresponsive.  Psychiatric:        Cognition and Memory: Cognition is impaired.            Vital Signs: BP (!) 151/82 (BP Location: Left Arm)   Pulse 90   Temp (!) 97.5 F (36.4 C)   Resp 16   Ht 5' 1 (1.549 m)   Wt 47.3 kg   SpO2 97%   BMI 19.70 kg/m  SpO2: SpO2: 97 % O2 Device: O2 Device: Room Air O2 Flow Rate: O2 Flow Rate (L/min): 2 L/min       Palliative Care Assessment & Plan   Patient Profile: 89 y.o. female  with past medical history significant of chronic heart failure preserved ejection fraction, moderate aortic stenosis.  2D echo done in August 2025, hypertension, chronic kidney disease stage III, and anemia admitted on 10/31/2024 with shortness of breath with minimal exertion x 5 days.  Patient does have chronic lower extremity edema and had followed up with cardiology Dr. Shlomo who had placed her on Lasix  but medication reconciliation does not show if patient is still taking Lasix .  During the evaluation patient denies any chest pain,  productive cough, fever and chills.  Patient's friend who brought her to the hospital also noted that patient has progressively had some cognitive decline.   Patient is also following with pulmonologist for chronic interstitial lung disease with PFT showing restrictive lung disease which was responsive to albuterol .   In the ED, chest x-ray shows pleural effusion, with proBNP of 10,000 and elevated troponin of 116 which remained flat.  Chest x-ray also shows increased interstitial infiltrates though patient does not have any productive cough or fever or chills or leukocytosis.  EKG was showing atrial fibrillation which is new onset rate controlled.  CT head was unremarkable.  Patient was given 20 mg of IV Lasix  and admitted for further workup.   PMT has been consulted to assist with goals of care conversation. Patient/Family face treatment option decisions, advanced directive decisions and anticipatory care needs.   Assessment: End of life care  Recommendations/Plan: Continue DNR/DNI Continue comfort focused care RN notified of family's request to hold off on sedating medications until after they are able to interact with her Please notify [name] immediately when patient arouses  Education on comfort medications provided to family Reordered Toprol , statin, and multivitamin at family's request. Will not reorder IV heparin  after discussion with family  Goal remains to discharge home with hospice. Supported family in coordinating private caregivers - left voicemail for patient's financial POA, requesting copy of advance directives and permission for [name] to speak with lawyer to utilize her funds for caregivers Psychosocial and emotional support provided PMT will continue to follow and support   Prognosis:  < 6 months  Discharge Planning: Home with Hospice   Care plan was discussed with patient's RN, MD, nephew [name], niece [name]   [name] [name]  Palliative Medicine  Team Team phone # [phone]  Thank you for allowing the Palliative Medicine Team to assist in the care of this patient. Please utilize secure chat with additional questions, if there is no response within 30 minutes please call the above phone number.  Palliative Medicine Team providers are available by phone from 7am to 7pm daily and can be reached through the team cell phone.  Should this patient require assistance outside of these hours, please  call the patient's attending physician.    I personally spent a total of 50 minutes in the care of the patient today including preparing to see the patient, getting/reviewing separately obtained history, performing a medically appropriate exam/evaluation, counseling and educating, documenting clinical information in the EHR, and coordinating care.  "

## 2024-11-03 NOTE — TOC Progression Note (Signed)
 Transition of Care [facility]) - Progression Note    Patient Details  Name: [name] MRN: [id number] Date of Birth: 1927-07-12  Transition of Care [facility]) CM/SW Contact  Tawni [name], LCSW Phone Number: 11/03/2024, 2:54 PM  Clinical Narrative:     CSW spoke with the pts nephew, [name]. He reported that he is working to make contact with the pts POA, Alm Shams, to establish care. He stated that several voicemails have been left and believes that Mr. Shams may be unwilling to make arrangements for the pt; however, he has not spoken with him directly to confirm.  CSW received Mr. Campbells contact information and attempted to call; there was no answer. A HIPAA-compliant voicemail was left requesting a return call. CSW explained to the pts nephew that if the pts POA is unwilling to make arrangements for the pts safe discharge, an APS report will need to be made. ICM to follow.   Expected Discharge Plan: Home w Hospice Care Barriers to Discharge: Continued Medical Work up               Expected Discharge Plan and Services       Living arrangements for the past 2 months: Single Family Home                                       Social Drivers of Health (SDOH) Interventions SDOH Screenings   Food Insecurity: Patient Unable To Answer (10/31/2024)  Housing: Unknown (10/31/2024)  Transportation Needs: Patient Unable To Answer (10/31/2024)  Utilities: Patient Unable To Answer (10/31/2024)  Social Connections: Unknown (10/31/2024)  Tobacco Use: Low Risk (10/31/2024)    Readmission Risk Interventions     No data to display

## 2024-11-03 NOTE — Progress Notes (Signed)
 " PROGRESS NOTE  [name] FMW:[id number] DOB: 1927/06/12   PCP: Okey Carlin Redbird, MD  Patient is from: Home.  DOA: 10/31/2024 LOS: 3  Chief complaints Chief Complaint  Patient presents with   Failure To Thrive     Brief Narrative / Interim history: 89yo with h/o chronic HFpEF, moderate AS, HTN, stage 3b CKD, ILD, and anemia who presented on 1/3 with SOB. CXR with small pleural effusion, elevated proBNP, troponin 116. Found to be in new afib, rate controlled. Given Lasix , cardiology consulted.  Patient was admitted with working diagnosis of acute on chronic HFpEF, acute respiratory failure with hypoxia and new onset atrial fibrillation.  TTE with LVEF of 40 to 45%, G2 DD, moderate pericardial effusion without evidence of tamponade.  She was diuresed with IV Lasix .  She was also started on low-dose metoprolol  with IV heparin  for new onset A-fib.    Hospital course complicated by agitation and delirium.  Given age and guarded prognosis, palliative medicine consulted, and that she was transitioned to full comfort care on 1/6 after discussion with patient's nephew, [name].    Subjective: Seen and examined earlier this morning.  No major events overnight or this morning.  Sleepy this morning.   Assessment and plan: Acute on chronic HFpEF: TTE with LVEF of 40 to 45%, G2 DD and moderate pericardial effusion without tamponade Acute hypoxic respiratory failure: Likely due to acute CHF. -Diuresed with IV Lasix  under the guidance of cardiology.  Now comfort care. -Appreciate help by cardiology and palliative medicine  New onset atrial fibrillation: Rate controlled.  TTE as above.  New diagnosis of primary hypothyroidism. - Continue Toprol -XL - Treat hypothyroidism as below  Elevated troponin: Likely demand due to CHF.  No RWMA on TTE. -Manage CHF as above  Anemia of renal disease: Relatively stable.  Aortic stenosis-mild on TTE this admission.  CKD-3B: Relatively  stable  Interstitial lung disease-followed by Dr. Shelah outpatient.  Mild cognitive impairment: Unable to assess.  Currently sleepy.  Primary hypothyroidism: TSH 48,000.  Free T4 low at 0.77. -Started on Synthroid  here.  Continue.  Dyslipidemia -Continue simvastatin   Essential hypertension -Continue metoprolol .  Elevated d-dimer: Nonspecific.  Clinically low suspicion for VTE.  Also comfort care.  End-of-life care/full comfort care -Appreciate help by palliative medicine.   Body mass index is 19.7 kg/m.          DVT prophylaxis:    Code Status: DNR-comfort Family Communication: None at bedside Level of care: Palliative Care Status is: Inpatient Remains inpatient appropriate because: End-of-life care   Final disposition: Home with home hospice   35 minutes with more than 50% spent in reviewing records, counseling patient/family and coordinating care.  Consultants:  Cardiology Palliative medicine  Procedures: None  Microbiology summarized: None  Objective: Vitals:   11/02/24 1305 11/02/24 2056 11/02/24 2232 11/03/24 1414  BP: 136/76 (!) 161/90 (!) 151/82 (!) 149/60  Pulse: 77 66 90 66  Resp: 16 17 16 14   Temp: 97.8 F (36.6 C) 97.9 F (36.6 C) (!) 97.5 F (36.4 C) 98.4 F (36.9 C)  TempSrc: Oral Oral  Oral  SpO2: 98% 99% 97% 100%  Weight:      Height:        Examination:  GENERAL: No apparent distress.  Sleeping. NECK: Supple.  No apparent JVD.  RESP:  No IWOB.  Fair aeration bilaterally. CVS:  RRR. Heart sounds normal.  ABD/GI/GU: BS+. Abd soft, NTND.  MSK/EXT:  No apparent deformity. No edema.  SKIN: no  apparent skin lesion or wound NEURO: Sleepy.  No apparent focal neuro deficit. PSYCH: Calm.  Sleepy.  Sch Meds:  Scheduled Meds:  levothyroxine   50 mcg Oral Q0600   metoprolol  succinate  25 mg Oral Daily   multivitamin with minerals  1 tablet Oral Daily   simvastatin   20 mg Oral q1800   Continuous Infusions: PRN  Meds:.acetaminophen  **OR** acetaminophen , antiseptic oral rinse, artificial tears, glycopyrrolate  **OR** glycopyrrolate  **OR** glycopyrrolate , haloperidol  lactate, LORazepam  **OR** LORazepam , morphine  injection, morphine  CONCENTRATE **OR** morphine  CONCENTRATE, ondansetron  **OR** ondansetron  (ZOFRAN ) IV  Antimicrobials: Anti-infectives (From admission, onward)    Start     Dose/Rate Route Frequency Ordered Stop   11/01/24 0330  doxycycline  (VIBRAMYCIN ) 100 mg in sodium chloride  0.9 % 250 mL IVPB  Status:  Discontinued        100 mg 125 mL/hr over 120 Minutes Intravenous Every 12 hours 11/01/24 0232 11/01/24 1237        I have personally reviewed the following labs and images: CBC: Recent Labs  Lab 10/31/24 1811 11/01/24 0240 11/02/24 0424  WBC 6.8 5.8 6.2  NEUTROABS 4.8  --   --   HGB 11.7* 10.3* 9.7*  HCT 36.5 32.0* 30.5*  MCV 91.5 91.4 90.8  PLT 316 275 258   BMP &GFR Recent Labs  Lab 10/31/24 1811 11/01/24 0240 11/02/24 0424  NA 134* 134* 135  K 4.2 4.1 3.7  CL 99 100 98  CO2 23 23 27   GLUCOSE 113* 91 90  BUN 22 21 23   CREATININE 1.26* 1.25* 1.44*  CALCIUM 9.0 8.5* 8.6*  MG  --  2.0  --    Estimated Creatinine Clearance: 17.5 mL/min (A) (by C-G formula based on SCr of 1.44 mg/dL (H)). Liver & Pancreas: Recent Labs  Lab 10/31/24 1811 11/01/24 0240  AST 35 31  ALT 20 15  ALKPHOS 78 63  BILITOT 0.4 0.4  PROT 7.1 6.1*  ALBUMIN 4.1 3.5   No results for input(s): LIPASE, AMYLASE in the last 168 hours. No results for input(s): AMMONIA in the last 168 hours. Diabetic: No results for input(s): HGBA1C in the last 72 hours. No results for input(s): GLUCAP in the last 168 hours. Cardiac Enzymes: No results for input(s): CKTOTAL, CKMB, CKMBINDEX, TROPONINI in the last 168 hours. Recent Labs    05/31/24 0900 06/03/24 1127 10/31/24 1811  PROBNP 2,478* 1,988* 10,738.0*   Coagulation Profile: No results for input(s): INR, PROTIME in the  last 168 hours. Thyroid Function Tests: Recent Labs    11/01/24 0240  TSH 47.800*  FREET4 0.77*   Lipid Profile: No results for input(s): CHOL, HDL, LDLCALC, TRIG, CHOLHDL, LDLDIRECT in the last 72 hours. Anemia Panel: Recent Labs    11/01/24 0240  VITAMINB12 792   Urine analysis:    Component Value Date/Time   COLORURINE YELLOW 10/31/2024 1849   APPEARANCEUR HAZY (A) 10/31/2024 1849   LABSPEC 1.012 10/31/2024 1849   PHURINE 5.0 10/31/2024 1849   GLUCOSEU NEGATIVE 10/31/2024 1849   HGBUR NEGATIVE 10/31/2024 1849   BILIRUBINUR NEGATIVE 10/31/2024 1849   KETONESUR NEGATIVE 10/31/2024 1849   PROTEINUR >=300 (A) 10/31/2024 1849   NITRITE NEGATIVE 10/31/2024 1849   LEUKOCYTESUR NEGATIVE 10/31/2024 1849   Sepsis Labs: Invalid input(s): PROCALCITONIN, LACTICIDVEN  Microbiology: No results found for this or any previous visit (from the past 240 hours).  Radiology Studies: No results found.    [name] T. Kemberly Taves [facility]  If 7PM-7AM, please contact night-coverage [email or web address] 11/03/2024, 2:40 PM   "

## 2024-11-04 DIAGNOSIS — Z515 Encounter for palliative care: Secondary | ICD-10-CM | POA: Diagnosis not present

## 2024-11-04 DIAGNOSIS — D649 Anemia, unspecified: Secondary | ICD-10-CM | POA: Diagnosis not present

## 2024-11-04 DIAGNOSIS — J849 Interstitial pulmonary disease, unspecified: Secondary | ICD-10-CM | POA: Diagnosis not present

## 2024-11-04 DIAGNOSIS — I480 Paroxysmal atrial fibrillation: Secondary | ICD-10-CM | POA: Diagnosis not present

## 2024-11-04 DIAGNOSIS — N1832 Chronic kidney disease, stage 3b: Secondary | ICD-10-CM | POA: Diagnosis not present

## 2024-11-04 DIAGNOSIS — I5033 Acute on chronic diastolic (congestive) heart failure: Secondary | ICD-10-CM | POA: Diagnosis not present

## 2024-11-04 DIAGNOSIS — I509 Heart failure, unspecified: Secondary | ICD-10-CM | POA: Diagnosis not present

## 2024-11-04 DIAGNOSIS — I4891 Unspecified atrial fibrillation: Secondary | ICD-10-CM | POA: Diagnosis not present

## 2024-11-04 DIAGNOSIS — G3184 Mild cognitive impairment, so stated: Secondary | ICD-10-CM | POA: Diagnosis not present

## 2024-11-04 DIAGNOSIS — I350 Nonrheumatic aortic (valve) stenosis: Secondary | ICD-10-CM | POA: Diagnosis not present

## 2024-11-04 DIAGNOSIS — J9601 Acute respiratory failure with hypoxia: Secondary | ICD-10-CM | POA: Diagnosis not present

## 2024-11-04 DIAGNOSIS — R7989 Other specified abnormal findings of blood chemistry: Secondary | ICD-10-CM | POA: Diagnosis not present

## 2024-11-04 DIAGNOSIS — Z66 Do not resuscitate: Secondary | ICD-10-CM | POA: Diagnosis not present

## 2024-11-04 MED ORDER — CARMEX CLASSIC LIP BALM EX OINT: TOPICAL_OINTMENT | CUTANEOUS | Status: DC | PRN

## 2024-11-04 NOTE — TOC Progression Note (Signed)
 Transition of Care [facility]) - Progression Note    Patient Details  Name: [name] MRN: [id number] Date of Birth: [birth year]  Transition of Care [facility]) CM/SW Contact  Tawni [name], LCSW Phone Number: 11/04/2024, 4:49 PM  Clinical Narrative:     CSW spoke with the pts nephew, [name], who stated that he was on his way to the pts lawyers office to inquire about her finances, as the POA is not willing to return his calls. He stated he will follow back up with CSW. ICM to follow  Expected Discharge Plan: Home w Hospice Care Barriers to Discharge: Continued Medical Work up               Expected Discharge Plan and Services       Living arrangements for the past 2 months: Single Family Home                                       Social Drivers of Health (SDOH) Interventions SDOH Screenings   Food Insecurity: Patient Unable To Answer (10/31/2024)  Housing: Unknown (10/31/2024)  Transportation Needs: Patient Unable To Answer (10/31/2024)  Utilities: Patient Unable To Answer (10/31/2024)  Social Connections: Unknown (10/31/2024)  Tobacco Use: Low Risk (10/31/2024)    Readmission Risk Interventions     No data to display

## 2024-11-04 NOTE — Progress Notes (Addendum)
" °   11/04/24 0900  Spiritual Encounters  Type of Visit Initial  Care provided to: Patient  Referral source Clinical staff  Reason for visit Urgent spiritual support  OnCall Visit No  Spiritual Framework  Community/Connection Family;Friend(s)  Patient Stress Factors None identified  Family Stress Factors Loss  Interventions  Spiritual Care Interventions Made Prayer  Spiritual Care Plan  Spiritual Care Issues Still Outstanding Chaplain will continue to follow   Chaplain met with patient this morning - who was non communicative.  Prayed for patient at bedside in silence.  Spoke with supportive staff and RN - will continue to follow. Spoke with RN Merilee regarding her status at this time.    Rev. Mallie Dennis Molt  "

## 2024-11-04 NOTE — Progress Notes (Signed)
 "                                                                                                                                                         Daily Progress Note   Patient Name: [name]       Date: 11/04/2024 DOB: 1927/05/28  Age: 89 y.o. MRN#: [id number] Attending Physician: Kathrin Mignon DASEN, MD Primary Care Physician: Okey Carlin Redbird, MD Admit Date: 10/31/2024  Reason for Consultation/Follow-up: Establishing goals of care  Subjective: Patient resting comfortably.  Did not attempt to arouse to pressure for comfort.  No visitors were present.  Objective: Medical records reviewed including hospitalist, psychiatry, and TOC progress notes from 1/7, as well as MAR. Patient has not received PRNs for comfort in the past 24 hours. Vitals stable. Per TOC note 1/7, may need APS report if financial POA is unwilling to be involved in coordination of patient's care with next of kin [name]. Per hospitalist note, chronic comorbidities were overall stable prior to transition to comfort care. Psych note indicated that they did not complete consult due to transition to comfort care.  Hospitalist progress note 1/8 results.  No changes.  Per [facility], patient has been to the doctor to take oral medications today.  Physical Exam Vitals and nursing note reviewed.  Constitutional:      General: She is not in acute distress.    Appearance: She is ill-appearing.     Comments: Open mouth breathing  HENT:     Head: Normocephalic and atraumatic.  Cardiovascular:     Rate and Rhythm: Normal rate.  Pulmonary:     Effort: Pulmonary effort is normal. No tachypnea or respiratory distress.  Skin:    General: Skin is cool and dry.            Vital Signs: BP (!) 149/60 (BP Location: Right Arm)   Pulse 66   Temp 98.4 F (36.9 C) (Oral)   Resp 14   Ht 5' 1 (1.549 m)   Wt 47.3 kg   SpO2 100%   BMI 19.70 kg/m  SpO2: SpO2: 100 % O2 Device: O2 Device: Nasal Cannula O2 Flow Rate: O2 Flow Rate (L/min):  2 L/min       Palliative Care Assessment & Plan   Patient Profile: 89 y.o. female  with past medical history significant of chronic heart failure preserved ejection fraction, moderate aortic stenosis.  2D echo done in August 2025, hypertension, chronic kidney disease stage III, and anemia admitted on 10/31/2024 with shortness of breath with minimal exertion x 5 days.  Patient does have chronic lower extremity edema and had followed up with cardiology Dr. Shlomo who had placed her on Lasix  but medication reconciliation does not show if patient is still taking Lasix .  During the evaluation patient denies any chest  pain, productive cough, fever and chills.  Patient's friend who brought her to the hospital also noted that patient has progressively had some cognitive decline.   Patient is also following with pulmonologist for chronic interstitial lung disease with PFT showing restrictive lung disease which was responsive to albuterol .   In the ED, chest x-ray shows pleural effusion, with proBNP of 10,000 and elevated troponin of 116 which remained flat.  Chest x-ray also shows increased interstitial infiltrates though patient does not have any productive cough or fever or chills or leukocytosis.  EKG was showing atrial fibrillation which is new onset rate controlled.  CT head was unremarkable.  Patient was given 20 mg of IV Lasix  and admitted for further workup.   PMT has been consulted to assist with goals of care conversation. Patient/Family face treatment option decisions, advanced directive decisions and anticipatory care needs.   Assessment: End of life care  Recommendations/Plan: Continue DNR/DNI Continue comfort focused care Discontinued oral medications not aimed at providing comfort Goal remains to discharge home with hospice pending arrangement of caregivers PMT will continue to follow and support as needed   Prognosis:  < 6 months  Discharge Planning: Home with  Hospice Patient  [name] Fell, PA-C  Palliative Medicine Team Team phone # [phone]  Thank you for allowing the Palliative Medicine Team to assist in the care of this patient. Please utilize secure chat with additional questions, if there is no response within 30 minutes please call the above phone number.  Palliative Medicine Team providers are available by phone from 7am to 7pm daily and can be reached through the team cell phone.  Should this patient require assistance outside of these hours, please call the patient's attending physician.    I personally spent a total of 25 minutes in the care of the patient today including preparing to see the patient, getting/reviewing separately obtained history, performing a medically appropriate exam/evaluation, documenting clinical information in the EHR, and prescription medication management.  "

## 2024-11-04 NOTE — Progress Notes (Signed)
 " PROGRESS NOTE  [name] FMW:[id number] DOB: 03-24-1927   PCP: Okey Carlin Redbird, MD  Patient is from: Home.  DOA: 10/31/2024 LOS: 4  Chief complaints Chief Complaint  Patient presents with   Failure To Thrive     Brief Narrative / Interim history: 89yo with h/o chronic HFpEF, moderate AS, HTN, stage 3b CKD, ILD, and anemia who presented on 1/3 with SOB. CXR with small pleural effusion, elevated proBNP, troponin 116. Found to be in new afib, rate controlled. Given Lasix , cardiology consulted.  Patient was admitted with working diagnosis of acute on chronic HFpEF, acute respiratory failure with hypoxia and new onset atrial fibrillation.  TTE with LVEF of 40 to 45%, G2 DD, moderate pericardial effusion without evidence of tamponade.  She was diuresed with IV Lasix .  She was also started on low-dose metoprolol  with IV heparin  for new onset A-fib.    Hospital course complicated by agitation and delirium.  Given age and guarded prognosis, palliative medicine consulted, and that she was transitioned to full comfort care on 1/6 after discussion with patient's nephew, [name].   Subjective: Seen and examined earlier this morning.  No major events overnight or this morning.  Sleepy   Assessment and plan: Acute on chronic HFpEF: TTE with LVEF of 40 to 45%, G2 DD and moderate pericardial effusion without tamponade Acute hypoxic respiratory failure: Likely due to acute CHF. -Diuresed with IV Lasix  under the guidance of cardiology.  Now comfort care. -Appreciate help by cardiology and palliative medicine  New onset atrial fibrillation: Rate controlled.  TTE as above.  New diagnosis of primary hypothyroidism. - Continue Toprol -XL - Treat hypothyroidism as below  Elevated troponin: Likely demand due to CHF.  No RWMA on TTE. -Manage CHF as above  Anemia of renal disease: Relatively stable.  Aortic stenosis-mild on TTE this admission.  CKD-3B: Relatively stable  Interstitial lung  disease-followed by Dr. Shelah outpatient.  Mild cognitive impairment: Unable to assess.  Currently sleepy.  Primary hypothyroidism: TSH 48,000.  Free T4 low at 0.77. -Started on Synthroid  here.  Continue.  Dyslipidemia -Continue simvastatin   Essential hypertension -Continue metoprolol .  Elevated d-dimer: Nonspecific.  Clinically low suspicion for VTE.  Also comfort care.  End-of-life care/full comfort care -Appreciate help by palliative medicine. -TOC working with POA and nephew for disposition.   Body mass index is 19.7 kg/m.          DVT prophylaxis:    Code Status: DNR-comfort Family Communication: None at bedside Level of care: Palliative Care Status is: Inpatient Remains inpatient appropriate because: End-of-life care   Final disposition: Home with home hospice   35 minutes with more than 50% spent in reviewing records, counseling patient/family and coordinating care.  Consultants:  Cardiology Palliative medicine  Procedures: None  Microbiology summarized: None  Objective: Vitals:   11/02/24 2056 11/02/24 2232 11/03/24 1414 11/04/24 1253  BP: (!) 161/90 (!) 151/82 (!) 149/60 122/66  Pulse: 66 90 66 93  Resp: 17 16 14 18   Temp: 97.9 F (36.6 C) (!) 97.5 F (36.4 C) 98.4 F (36.9 C) 98.2 F (36.8 C)  TempSrc: Oral  Oral Oral  SpO2: 99% 97% 100% 94%  Weight:      Height:        Examination:  GENERAL: No apparent distress.  Sleeping. NECK: Supple.  No apparent JVD.  RESP:  No IWOB.  Fair aeration bilaterally. CVS:  RRR. Heart sounds normal.  ABD/GI/GU: BS+. Abd soft, NTND.  MSK/EXT:  No apparent deformity.  No edema.  SKIN: no apparent skin lesion or wound NEURO: Sleepy.  No apparent focal neuro deficit. PSYCH: Calm.  Sleepy.  Sch Meds:  Scheduled Meds:   Continuous Infusions: PRN Meds:.antiseptic oral rinse, artificial tears, [DISCONTINUED] glycopyrrolate  **OR** glycopyrrolate  **OR** glycopyrrolate , haloperidol  lactate, LORazepam   **OR** LORazepam , morphine  injection, [DISCONTINUED] morphine  CONCENTRATE **OR** morphine  CONCENTRATE, [DISCONTINUED] ondansetron  **OR** ondansetron  (ZOFRAN ) IV  Antimicrobials: Anti-infectives (From admission, onward)    Start     Dose/Rate Route Frequency Ordered Stop   11/01/24 0330  doxycycline  (VIBRAMYCIN ) 100 mg in sodium chloride  0.9 % 250 mL IVPB  Status:  Discontinued        100 mg 125 mL/hr over 120 Minutes Intravenous Every 12 hours 11/01/24 0232 11/01/24 1237        I have personally reviewed the following labs and images: CBC: Recent Labs  Lab 10/31/24 1811 11/01/24 0240 11/02/24 0424  WBC 6.8 5.8 6.2  NEUTROABS 4.8  --   --   HGB 11.7* 10.3* 9.7*  HCT 36.5 32.0* 30.5*  MCV 91.5 91.4 90.8  PLT 316 275 258   BMP &GFR Recent Labs  Lab 10/31/24 1811 11/01/24 0240 11/02/24 0424  NA 134* 134* 135  K 4.2 4.1 3.7  CL 99 100 98  CO2 23 23 27   GLUCOSE 113* 91 90  BUN 22 21 23   CREATININE 1.26* 1.25* 1.44*  CALCIUM 9.0 8.5* 8.6*  MG  --  2.0  --    Estimated Creatinine Clearance: 17.5 mL/min (A) (by C-G formula based on SCr of 1.44 mg/dL (H)). Liver & Pancreas: Recent Labs  Lab 10/31/24 1811 11/01/24 0240  AST 35 31  ALT 20 15  ALKPHOS 78 63  BILITOT 0.4 0.4  PROT 7.1 6.1*  ALBUMIN 4.1 3.5   No results for input(s): LIPASE, AMYLASE in the last 168 hours. No results for input(s): AMMONIA in the last 168 hours. Diabetic: No results for input(s): HGBA1C in the last 72 hours. No results for input(s): GLUCAP in the last 168 hours. Cardiac Enzymes: No results for input(s): CKTOTAL, CKMB, CKMBINDEX, TROPONINI in the last 168 hours. Recent Labs    05/31/24 0900 06/03/24 1127 10/31/24 1811  PROBNP 2,478* 1,988* 10,738.0*   Coagulation Profile: No results for input(s): INR, PROTIME in the last 168 hours. Thyroid Function Tests: No results for input(s): TSH, T4TOTAL, FREET4, T3FREE, THYROIDAB in the last 72  hours.  Lipid Profile: No results for input(s): CHOL, HDL, LDLCALC, TRIG, CHOLHDL, LDLDIRECT in the last 72 hours. Anemia Panel: No results for input(s): VITAMINB12, FOLATE, FERRITIN, TIBC, IRON, RETICCTPCT in the last 72 hours.  Urine analysis:    Component Value Date/Time   COLORURINE YELLOW 10/31/2024 1849   APPEARANCEUR HAZY (A) 10/31/2024 1849   LABSPEC 1.012 10/31/2024 1849   PHURINE 5.0 10/31/2024 1849   GLUCOSEU NEGATIVE 10/31/2024 1849   HGBUR NEGATIVE 10/31/2024 1849   BILIRUBINUR NEGATIVE 10/31/2024 1849   KETONESUR NEGATIVE 10/31/2024 1849   PROTEINUR >=300 (A) 10/31/2024 1849   NITRITE NEGATIVE 10/31/2024 1849   LEUKOCYTESUR NEGATIVE 10/31/2024 1849   Sepsis Labs: Invalid input(s): PROCALCITONIN, LACTICIDVEN  Microbiology: No results found for this or any previous visit (from the past 240 hours).  Radiology Studies: No results found.    Emilyanne Mcgough T. Ladawn Boullion [facility]  If 7PM-7AM, please contact night-coverage [email or web address] 11/04/2024, 2:09 PM   "

## 2024-11-05 DIAGNOSIS — I4891 Unspecified atrial fibrillation: Secondary | ICD-10-CM | POA: Diagnosis not present

## 2024-11-05 DIAGNOSIS — I5033 Acute on chronic diastolic (congestive) heart failure: Secondary | ICD-10-CM | POA: Diagnosis not present

## 2024-11-05 DIAGNOSIS — D649 Anemia, unspecified: Secondary | ICD-10-CM | POA: Diagnosis not present

## 2024-11-05 DIAGNOSIS — J9601 Acute respiratory failure with hypoxia: Secondary | ICD-10-CM | POA: Diagnosis not present

## 2024-11-05 DIAGNOSIS — I509 Heart failure, unspecified: Secondary | ICD-10-CM | POA: Diagnosis not present

## 2024-11-05 DIAGNOSIS — Z515 Encounter for palliative care: Secondary | ICD-10-CM | POA: Diagnosis not present

## 2024-11-05 DIAGNOSIS — Z66 Do not resuscitate: Secondary | ICD-10-CM | POA: Diagnosis not present

## 2024-11-05 DIAGNOSIS — I480 Paroxysmal atrial fibrillation: Secondary | ICD-10-CM | POA: Diagnosis not present

## 2024-11-05 DIAGNOSIS — J849 Interstitial pulmonary disease, unspecified: Secondary | ICD-10-CM | POA: Diagnosis not present

## 2024-11-05 DIAGNOSIS — R7989 Other specified abnormal findings of blood chemistry: Secondary | ICD-10-CM | POA: Diagnosis not present

## 2024-11-05 DIAGNOSIS — G3184 Mild cognitive impairment, so stated: Secondary | ICD-10-CM | POA: Diagnosis not present

## 2024-11-05 DIAGNOSIS — N1832 Chronic kidney disease, stage 3b: Secondary | ICD-10-CM | POA: Diagnosis not present

## 2024-11-05 DIAGNOSIS — I350 Nonrheumatic aortic (valve) stenosis: Secondary | ICD-10-CM | POA: Diagnosis not present

## 2024-11-05 LAB — CBC
HCT: 34.2 % — ABNORMAL LOW (ref 36.0–46.0)
Hemoglobin: 11 g/dL — ABNORMAL LOW (ref 12.0–15.0)
MCH: 29.2 pg (ref 26.0–34.0)
MCHC: 32.2 g/dL (ref 30.0–36.0)
MCV: 90.7 fL (ref 80.0–100.0)
Platelets: 257 K/uL (ref 150–400)
RBC: 3.77 MIL/uL — ABNORMAL LOW (ref 3.87–5.11)
RDW: 15.4 % (ref 11.5–15.5)
WBC: 9.4 K/uL (ref 4.0–10.5)
nRBC: 0 % (ref 0.0–0.2)

## 2024-11-05 LAB — COMPREHENSIVE METABOLIC PANEL WITH GFR
ALT: 10 U/L (ref 0–44)
AST: 26 U/L (ref 15–41)
Albumin: 3.5 g/dL (ref 3.5–5.0)
Alkaline Phosphatase: 70 U/L (ref 38–126)
Anion gap: 13 (ref 5–15)
BUN: 30 mg/dL — ABNORMAL HIGH (ref 8–23)
CO2: 28 mmol/L (ref 22–32)
Calcium: 8.6 mg/dL — ABNORMAL LOW (ref 8.9–10.3)
Chloride: 94 mmol/L — ABNORMAL LOW (ref 98–111)
Creatinine, Ser: 1.48 mg/dL — ABNORMAL HIGH (ref 0.44–1.00)
GFR, Estimated: 32 mL/min — ABNORMAL LOW
Glucose, Bld: 213 mg/dL — ABNORMAL HIGH (ref 70–99)
Potassium: 3.7 mmol/L (ref 3.5–5.1)
Sodium: 135 mmol/L (ref 135–145)
Total Bilirubin: 0.4 mg/dL (ref 0.0–1.2)
Total Protein: 6.3 g/dL — ABNORMAL LOW (ref 6.5–8.1)

## 2024-11-05 LAB — MAGNESIUM: Magnesium: 1.9 mg/dL (ref 1.7–2.4)

## 2024-11-05 MED ORDER — METOPROLOL SUCCINATE ER 25 MG PO TB24
25.0000 mg | ORAL_TABLET | Freq: Every day | ORAL | Status: DC
  Administered 2024-11-05 – 2024-11-08 (×4): 25 mg via ORAL
  Filled 2024-11-05 (×4): qty 1

## 2024-11-05 MED ORDER — ENOXAPARIN SODIUM 30 MG/0.3ML IJ SOSY: 30.0000 mg | PREFILLED_SYRINGE | INTRAMUSCULAR | Status: DC

## 2024-11-05 MED ORDER — HEPARIN SODIUM (PORCINE) 5000 UNIT/ML IJ SOLN
5000.0000 [IU] | Freq: Three times a day (TID) | INTRAMUSCULAR | Status: DC
  Administered 2024-11-06 – 2024-11-08 (×5): 5000 [IU] via SUBCUTANEOUS
  Filled 2024-11-05 (×6): qty 1

## 2024-11-05 MED ORDER — LEVOTHYROXINE SODIUM 50 MCG PO TABS
50.0000 ug | ORAL_TABLET | Freq: Every day | ORAL | Status: DC
  Administered 2024-11-06 – 2024-11-07 (×2): 50 ug via ORAL
  Filled 2024-11-05 (×3): qty 1

## 2024-11-05 MED ORDER — AMLODIPINE BESYLATE 10 MG PO TABS
5.0000 mg | ORAL_TABLET | Freq: Every day | ORAL | Status: DC
  Administered 2024-11-05 – 2024-11-08 (×4): 5 mg via ORAL
  Filled 2024-11-05 (×4): qty 1

## 2024-11-05 NOTE — Progress Notes (Addendum)
 "                                                                                                                                                         Daily Progress Note   Patient Name: [name]       Date: 11/05/2024 DOB: 1927-02-28  Age: 89 y.o. MRN#: [id number] Attending Physician: Kathrin Mignon DASEN, MD Primary Care Physician: Okey Carlin Redbird, MD Admit Date: 10/31/2024  Reason for Consultation/Follow-up: Establishing goals of care  Subjective: Patient is in bedside chair, frustrating with RN and nutrition staff about her toast and its temperature. No visitors present.  During my call with patient's nephew, he shares that they were able to visit with her yesterday and plan to come again later today. He is still working on getting enough caregiver support to honor her wish of dying in her own home. No other needs at this time. Advised that PMT will be following peripherally moving forward. Encouraged Robbie to call if he has questions, concerns, or other needs.   Objective: Patient appears comfortable although irritable. Per MAR, patient has not received as needed comfort medications in the past 24 hours.  Physical Exam Vitals and nursing note reviewed.  Constitutional:      General: She is awake. She is not in acute distress.    Appearance: She is normal weight. She is ill-appearing.  HENT:     Head: Normocephalic and atraumatic.  Cardiovascular:     Rate and Rhythm: Normal rate.  Pulmonary:     Effort: Pulmonary effort is normal. No tachypnea or respiratory distress.  Neurological:     Mental Status: She is alert.  Psychiatric:        Mood and Affect: Affect is angry.        Judgment: Judgment is impulsive.            Vital Signs: BP 109/76 (BP Location: Right Arm)   Pulse 90   Temp 98.7 F (37.1 C) (Oral)   Resp 18   Ht 5' 1 (1.549 m)   Wt 47.3 kg   SpO2 94%   BMI 19.70 kg/m  SpO2: SpO2: 94 % O2 Device: O2 Device: Room Air O2 Flow Rate: O2 Flow Rate (L/min):  2 L/min       Palliative Care Assessment & Plan   Patient Profile: 89 y.o. female  with past medical history significant of chronic heart failure preserved ejection fraction, moderate aortic stenosis.  2D echo done in August 2025, hypertension, chronic kidney disease stage III, and anemia admitted on 10/31/2024 with shortness of breath with minimal exertion x 5 days.  Patient does have chronic lower extremity edema and had followed up with cardiology Dr. Shlomo who had placed her on Lasix  but medication reconciliation does not show if patient is still  taking Lasix .  During the evaluation patient denies any chest pain, productive cough, fever and chills.  Patient's friend who brought her to the hospital also noted that patient has progressively had some cognitive decline.   Patient is also following with pulmonologist for chronic interstitial lung disease with PFT showing restrictive lung disease which was responsive to albuterol .   In the ED, chest x-ray shows pleural effusion, with proBNP of 10,000 and elevated troponin of 116 which remained flat.  Chest x-ray also shows increased interstitial infiltrates though patient does not have any productive cough or fever or chills or leukocytosis.  EKG was showing atrial fibrillation which is new onset rate controlled.  CT head was unremarkable.  Patient was given 20 mg of IV Lasix  and admitted for further workup.   PMT has been consulted to assist with goals of care conversation. Patient/Family face treatment option decisions, advanced directive decisions and anticipatory care needs.   Assessment: End of life care  Recommendations/Plan: Continue DNR/DNI Continue comfort focused care, no adjustments required today Goal remains to discharge home with hospice pending arrangement of caregivers.  Confirmed with nephew that he is working on this PMT will continue to follow and support as needed.  Please call team line if urgent needs arise  Addendum:  received update from LCSW that nephew is requesting SNF placement rather than hospice. Changed code status to DNR-Limited. Spoke with primary attending, [facility] hospice liaison. Request for outpatient palliative referral received. Placed TOC consult. Will defer resumption of regular medications, labs, etc to primary physician.  Prognosis:  < 6 months  Discharge Planning: Home with Hospice   Jeanita Carneiro SHAUNNA Fell, [place]  Palliative Medicine Team Team phone # [phone]  Thank you for allowing the Palliative Medicine Team to assist in the care of this patient. Please utilize secure chat with additional questions, if there is no response within 30 minutes please call the above phone number.  Palliative Medicine Team providers are available by phone from 7am to 7pm daily and can be reached through the team cell phone.  Should this patient require assistance outside of these hours, please call the patient's attending physician.    I personally spent a total of 25 minutes in the care of the patient today including preparing to see the patient, getting/reviewing separately obtained history, performing a medically appropriate exam/evaluation, documenting clinical information in the EHR, and psychosocial support for patient's family.  "

## 2024-11-05 NOTE — TOC Progression Note (Signed)
 Transition of Care [facility]) - Progression Note    Patient Details  Name: [name] MRN: [id number] Date of Birth: Dec 04, 1926  Transition [facility]) CM/SW Contact  Tawni [name], LCSW Phone Number: 11/05/2024, 1:59 PM  Clinical Narrative:     CSW spoke with the pts nephew and niece. They would like the pt to go to rehab and do not want her to return home under hospice at this time. CSW explained the SNF placement process, noting that the pt will not need insurance authorization. CSW will fax the pt information for SNF placement. ICM to follow.               Expected Discharge Plan and Services       Living arrangements for the past 2 months: Single Family Home                                       Social Drivers of Health (SDOH) Interventions SDOH Screenings   Food Insecurity: Patient Unable To Answer (10/31/2024)  Housing: Unknown (10/31/2024)  Transportation Needs: Patient Unable To Answer (10/31/2024)  Utilities: Patient Unable To Answer (10/31/2024)  Social Connections: Unknown (10/31/2024)  Tobacco Use: Low Risk (10/31/2024)    Readmission Risk Interventions     No data to display

## 2024-11-05 NOTE — Progress Notes (Signed)
 Chaplains have been following [name] this week and have stopped by several times but have missed the family when they have been at bedside.  Until today, [name] had not been alert at the time of my visits.  I met with [name] today to provide support and was pleased to see her sitting up in her chair. She told me that she was going home soon and that seemed to be her primary focus. Her nurse said that she had been somewhat confused and was not being discharged at this time.  I received a consult later today stating that family had questions about HCPOA.  I was unable to return to meet with the family, but would recommend that if they have additional questions, staff may print for them the current living will that she has on file under ACP documents.  These documents do not list a HCPOA but make her wishes clear in the scenarios outlined in the living will.    Due to her confusion at this time, we would be unable to notarize a legal document assigning a HCPOA but her next of kin, according to [place] law, would be her decision maker.    If there are additional questions, please consult or page us  at [phone].   Chaplain [employer], Bcc

## 2024-11-05 NOTE — [place] FL2 (Signed)
 "   MEDICAID FL2 LEVEL OF CARE FORM     IDENTIFICATION  Patient Name: [name] Birthdate: 08/11/1927 Sex: female Admission Date (Current Location): 10/31/2024  [facility] and [place] Number:  Producer, Television/film/video and Address:  [facility],  [address], [place] [address]      Provider Number: [id number]  Attending Physician Name and Address:  Kathrin Mignon DASEN, MD  Relative Name and Phone Number:  Marikay [name] [place], Emergency Contact  [phone] (Mobile    Current Level of Care: Hospital Recommended Level of Care: Skilled Nursing Facility Prior Approval Number:    Date Approved/Denied:   PASRR Number: Pending  Discharge Plan: SNF    Current Diagnoses: Patient Active Problem List   Diagnosis Date Noted   Acute hypoxic respiratory failure (HCC) 11/02/2024   Acute on chronic heart failure with preserved ejection fraction (HFpEF) (HCC) 11/01/2024   ILD (interstitial lung disease) (HCC) 11/01/2024   MCI (mild cognitive impairment) 11/01/2024   DNR (do not resuscitate) 11/01/2024   Hypothyroidism 11/01/2024   Dyslipidemia 11/01/2024   Essential hypertension 11/01/2024   Elevated d-dimer 11/01/2024   Acute CHF (congestive heart failure) (HCC) 10/31/2024   Chronic kidney disease, stage 3b (HCC) 10/31/2024   New onset atrial fibrillation (HCC) 10/31/2024   Elevated troponin 10/31/2024   Aortic stenosis    Impacted cerumen of both ears 02/03/2024   Normocytic normochromic anemia 09/18/2023   S/P shoulder replacement, 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    Orientation RESPIRATION BLADDER Height & Weight     Self  Normal Incontinent Weight: 104 lb 4.4 oz (47.3 kg) Height:  5' 1 (154.9 cm)  BEHAVIORAL SYMPTOMS/MOOD NEUROLOGICAL BOWEL NUTRITION STATUS      Continent Diet (heart healthy)  AMBULATORY STATUS COMMUNICATION OF NEEDS Skin   Limited Assist Verbally PU Stage and Appropriate Care, Other (Comment) (vertbral)                       Personal  Care Assistance Level of Assistance  Feeding, Bathing, Dressing Bathing Assistance: Limited assistance Feeding assistance: Independent Dressing Assistance: Limited assistance     Functional Limitations Info  Speech, Hearing, Sight Sight Info: Adequate Hearing Info: Adequate Speech Info: Adequate    SPECIAL CARE FACTORS FREQUENCY  PT (By licensed PT), OT (By licensed OT)     PT Frequency: 5 x a week OT Frequency: 5 x a week            Contractures Contractures Info: Not present    Additional Factors Info  Code Status, Allergies Code Status Info: DNR Allergies Info: Codeine           Current Medications (11/05/2024):  This is the current hospital active medication list Current Facility-Administered Medications  Medication Dose Route Frequency Provider Last Rate Last Admin   amLODipine  (NORVASC ) tablet 5 mg  5 mg Oral Daily Gonfa, Taye T, MD   5 mg at 11/05/24 1353   antiseptic oral rinse (BIOTENE) solution 15 mL  15 mL Topical PRN Cooper, Josseline P, PA-C       artificial tears ophthalmic solution 1 drop  1 drop Both Eyes QID PRN Cooper, Josseline P, PA-C       glycopyrrolate  (ROBINUL ) injection 0.2 mg  0.2 mg Subcutaneous Q4H PRN Cooper, Josseline P, PA-C       Or   glycopyrrolate  (ROBINUL ) injection 0.2 mg  0.2 mg Intravenous Q4H PRN Cooper, Josseline P, PA-C       haloperidol  lactate (HALDOL ) injection  2 mg  2 mg Intravenous Q6H PRN Barbarann Nest, MD   2 mg at 11/03/24 0453   heparin  injection 5,000 Units  5,000 Units Subcutaneous Q8H Gonfa, Taye T, MD       [START ON 11/06/2024] levothyroxine  (SYNTHROID ) tablet 50 mcg  50 mcg Oral Q0600 Gonfa, Taye T, MD       lip balm (CARMEX) ointment   Topical PRN Gonfa, Taye T, MD       LORazepam  (ATIVAN ) 2 MG/ML concentrated solution 1 mg  1 mg Sublingual Q4H PRN Cooper, Josseline P, PA-C       Or   LORazepam  (ATIVAN ) injection 1 mg  1 mg Intravenous Q2H PRN Cooper, Josseline P, PA-C   1 mg at 11/02/24 2315   metoprolol   succinate (TOPROL -XL) 24 hr tablet 25 mg  25 mg Oral Daily Gonfa, Taye T, MD   25 mg at 11/05/24 1353   morphine  (PF) 2 MG/ML injection 1-4 mg  1-4 mg Intravenous Q15 min PRN Cooper, Josseline P, PA-C       morphine  CONCENTRATE 10 mg / 0.5 ml oral solution 5 mg  5 mg Sublingual Q2H PRN Cooper, Josseline P, PA-C       ondansetron  (ZOFRAN ) injection 4 mg  4 mg Intravenous Q6H PRN Cooper, Josseline P, PA-C         Discharge Medications: Please see discharge summary for a list of discharge medications.  Relevant Imaging Results:  Relevant Lab Results:   Additional Information SSN [id number]  Tawni HERO Tallin Hart, LCSW     "

## 2024-11-05 NOTE — Progress Notes (Signed)
 " PROGRESS NOTE  [name] FMW:[id number] DOB: 05-20-1927   PCP: Okey Carlin Redbird, MD  Patient is from: Home.  DOA: 10/31/2024 LOS: 5  Chief complaints Chief Complaint  Patient presents with   Failure To Thrive     Brief Narrative / Interim history: 89yo with h/o chronic HFpEF, moderate AS, HTN, stage 3b CKD, ILD, and anemia who presented on 1/3 with SOB. CXR with small pleural effusion, elevated proBNP, troponin 116. Found to be in new afib, rate controlled. Given Lasix , cardiology consulted.  Patient was admitted with working diagnosis of acute on chronic HFpEF, acute respiratory failure with hypoxia and new onset atrial fibrillation.  TTE with LVEF of 40 to 45%, G2 DD, moderate pericardial effusion without evidence of tamponade.  She was diuresed with IV Lasix .  She was also started on low-dose metoprolol  with IV heparin  for new onset A-fib.    Hospital course complicated by agitation and delirium.  Given age and guarded prognosis, palliative medicine consulted, and  she was transitioned to full comfort care on 1/6 after discussion with patient's nephew, [name].  Nephew changed his mind and rescinded comfort care and hospice.  He is requesting SNF placement.  CODE STATUS updated.  TOC consulted.   Subjective: Seen and examined earlier this morning.  No major events overnight or this morning.  She was sitting on bedside chair.  Asking for toast fluid.  Denies pain.   Assessment and plan: Acute on chronic HFpEF: TTE with LVEF of 40 to 45%, G2 DD and moderate pericardial effusion without tamponade Acute hypoxic respiratory failure: Likely due to acute CHF. -Diuresed with IV Lasix  under the guidance of cardiology.  Hold off further diuretics. -Resume Toprol -XL 25 mg daily -Appreciate help by cardiology and palliative medicine  New onset atrial fibrillation: Rate controlled.  TTE as above.  New diagnosis of primary hypothyroidism. - Continue Toprol -XL - Treat hypothyroidism as  below  Elevated troponin: Likely demand due to CHF.  No RWMA on TTE. -Manage CHF as above  Anemia of renal disease: Relatively stable. - Recheck labs.  Aortic stenosis-mild on TTE this admission.  CKD-3B: Relatively stable - Recheck labs.  Interstitial lung disease-followed by Dr. Shelah outpatient.  Mild cognitive impairment:  - Reorientation and delirium precaution.  Primary hypothyroidism: TSH 48,000.  Free T4 low at 0.77. -Resume Synthroid  at 50 mcg daily.  Dyslipidemia -Continue simvastatin   Essential hypertension: BP elevated. - Resume amlodipine  and Toprol -XL.  Elevated d-dimer: Nonspecific.  Clinically low suspicion for VTE.   End-of-life care/full comfort care -Appreciate help by palliative medicine. -TOC working with POA and nephew for disposition.   Body mass index is 19.7 kg/m.        Pressure skin injury: Wound 11/04/24 2330 Pressure Injury Vertebral column Right Stage 2 -  Partial thickness loss of dermis presenting as a shallow open injury with a red, pink wound bed without slough. (Active)   DVT prophylaxis:  Will resume chemical DVT prophylaxis after labs  Code Status: DNR-comfort Family Communication: None at bedside Level of care: Palliative Care Status is: Inpatient Remains inpatient appropriate because: SNF   Final disposition: SNF   35 minutes with more than 50% spent in reviewing records, counseling patient/family and coordinating care.  Consultants:  Cardiology Palliative medicine  Procedures: None  Microbiology summarized: None  Objective: Vitals:   11/04/24 1253 11/04/24 2057 11/05/24 0502 11/05/24 1253  BP: 122/66 (!) 146/76 109/76 (!) 149/95  Pulse: 93 (!) 104 90 96  Resp: 18 18 18  16  Temp: 98.2 F (36.8 C) 98.2 F (36.8 C) 98.7 F (37.1 C) 97.9 F (36.6 C)  TempSrc: Oral Oral Oral Oral  SpO2: 94% 96% 94% 99%  Weight:      Height:        Examination:  GENERAL: Appears frail.  Sitting on bedside  chair. NECK: Supple.  No apparent JVD.  RESP:  No IWOB.  Fair aeration bilaterally. CVS:  RRR. Heart sounds normal.  ABD/GI/GU: BS+. Abd soft, NTND.  MSK/EXT:  No apparent deformity. No edema.  Significant muscle mass and subcu fat loss. SKIN: no apparent skin lesion or wound NEURO: Sleepy.  No apparent focal neuro deficit. PSYCH: Calm.   Sch Meds:  Scheduled Meds:  amLODipine   5 mg Oral Daily   [START ON 11/06/2024] levothyroxine   50 mcg Oral Q0600   metoprolol  succinate  25 mg Oral Daily    Continuous Infusions: PRN Meds:.antiseptic oral rinse, artificial tears, [DISCONTINUED] glycopyrrolate  **OR** glycopyrrolate  **OR** glycopyrrolate , haloperidol  lactate, lip balm, LORazepam  **OR** LORazepam , morphine  injection, [DISCONTINUED] morphine  CONCENTRATE **OR** morphine  CONCENTRATE, [DISCONTINUED] ondansetron  **OR** ondansetron  (ZOFRAN ) IV  Antimicrobials: Anti-infectives (From admission, onward)    Start     Dose/Rate Route Frequency Ordered Stop   11/01/24 0330  doxycycline  (VIBRAMYCIN ) 100 mg in sodium chloride  0.9 % 250 mL IVPB  Status:  Discontinued        100 mg 125 mL/hr over 120 Minutes Intravenous Every 12 hours 11/01/24 0232 11/01/24 1237        I have personally reviewed the following labs and images: CBC: Recent Labs  Lab 10/31/24 1811 11/01/24 0240 11/02/24 0424  WBC 6.8 5.8 6.2  NEUTROABS 4.8  --   --   HGB 11.7* 10.3* 9.7*  HCT 36.5 32.0* 30.5*  MCV 91.5 91.4 90.8  PLT 316 275 258   BMP &GFR Recent Labs  Lab 10/31/24 1811 11/01/24 0240 11/02/24 0424  NA 134* 134* 135  K 4.2 4.1 3.7  CL 99 100 98  CO2 23 23 27   GLUCOSE 113* 91 90  BUN 22 21 23   CREATININE 1.26* 1.25* 1.44*  CALCIUM 9.0 8.5* 8.6*  MG  --  2.0  --    Estimated Creatinine Clearance: 17.5 mL/min (A) (by C-G formula based on SCr of 1.44 mg/dL (H)). Liver & Pancreas: Recent Labs  Lab 10/31/24 1811 11/01/24 0240  AST 35 31  ALT 20 15  ALKPHOS 78 63  BILITOT 0.4 0.4  PROT 7.1  6.1*  ALBUMIN 4.1 3.5   No results for input(s): LIPASE, AMYLASE in the last 168 hours. No results for input(s): AMMONIA in the last 168 hours. Diabetic: No results for input(s): HGBA1C in the last 72 hours. No results for input(s): GLUCAP in the last 168 hours. Cardiac Enzymes: No results for input(s): CKTOTAL, CKMB, CKMBINDEX, TROPONINI in the last 168 hours. Recent Labs    05/31/24 0900 06/03/24 1127 10/31/24 1811  PROBNP 2,478* 1,988* 10,738.0*   Coagulation Profile: No results for input(s): INR, PROTIME in the last 168 hours. Thyroid Function Tests: No results for input(s): TSH, T4TOTAL, FREET4, T3FREE, THYROIDAB in the last 72 hours.  Lipid Profile: No results for input(s): CHOL, HDL, LDLCALC, TRIG, CHOLHDL, LDLDIRECT in the last 72 hours. Anemia Panel: No results for input(s): VITAMINB12, FOLATE, FERRITIN, TIBC, IRON, RETICCTPCT in the last 72 hours.  Urine analysis:    Component Value Date/Time   COLORURINE YELLOW 10/31/2024 1849   APPEARANCEUR HAZY (A) 10/31/2024 1849   LABSPEC 1.012 10/31/2024 1849  PHURINE 5.0 10/31/2024 1849   GLUCOSEU NEGATIVE 10/31/2024 1849   HGBUR NEGATIVE 10/31/2024 1849   BILIRUBINUR NEGATIVE 10/31/2024 1849   KETONESUR NEGATIVE 10/31/2024 1849   PROTEINUR >=300 (A) 10/31/2024 1849   NITRITE NEGATIVE 10/31/2024 1849   LEUKOCYTESUR NEGATIVE 10/31/2024 1849   Sepsis Labs: Invalid input(s): PROCALCITONIN, LACTICIDVEN  Microbiology: No results found for this or any previous visit (from the past 240 hours).  Radiology Studies: No results found.    Christana Angelica T. Ta[place] Tapp [facility]  If 7PM-7AM, please contact night-coverage [email or web address] 11/05/2024, 1:43 PM   "

## 2024-11-06 DIAGNOSIS — I5033 Acute on chronic diastolic (congestive) heart failure: Secondary | ICD-10-CM | POA: Diagnosis not present

## 2024-11-06 DIAGNOSIS — I350 Nonrheumatic aortic (valve) stenosis: Secondary | ICD-10-CM | POA: Diagnosis not present

## 2024-11-06 DIAGNOSIS — J849 Interstitial pulmonary disease, unspecified: Secondary | ICD-10-CM | POA: Diagnosis not present

## 2024-11-06 DIAGNOSIS — I480 Paroxysmal atrial fibrillation: Secondary | ICD-10-CM | POA: Diagnosis not present

## 2024-11-06 DIAGNOSIS — G3184 Mild cognitive impairment, so stated: Secondary | ICD-10-CM | POA: Diagnosis not present

## 2024-11-06 DIAGNOSIS — D649 Anemia, unspecified: Secondary | ICD-10-CM | POA: Diagnosis not present

## 2024-11-06 DIAGNOSIS — Z515 Encounter for palliative care: Secondary | ICD-10-CM | POA: Diagnosis not present

## 2024-11-06 DIAGNOSIS — J9601 Acute respiratory failure with hypoxia: Secondary | ICD-10-CM | POA: Diagnosis not present

## 2024-11-06 DIAGNOSIS — I509 Heart failure, unspecified: Secondary | ICD-10-CM | POA: Diagnosis not present

## 2024-11-06 DIAGNOSIS — Z66 Do not resuscitate: Secondary | ICD-10-CM | POA: Diagnosis not present

## 2024-11-06 DIAGNOSIS — N1832 Chronic kidney disease, stage 3b: Secondary | ICD-10-CM | POA: Diagnosis not present

## 2024-11-06 DIAGNOSIS — R7989 Other specified abnormal findings of blood chemistry: Secondary | ICD-10-CM | POA: Diagnosis not present

## 2024-11-06 MED ORDER — ACETAMINOPHEN 325 MG PO TABS
650.0000 mg | ORAL_TABLET | Freq: Four times a day (QID) | ORAL | Status: DC | PRN
  Administered 2024-11-07: 650 mg via ORAL
  Filled 2024-11-06: qty 2

## 2024-11-06 NOTE — Progress Notes (Signed)
 Darryle Law [address]?Hospital Liaison note:??    Notified by care manager Christina of patient/family request for [facility] services at home after discharge.????????   ????   Hospital liaison will follow patient for discharge disposition.?   ????   Please call with any hospice or outpatient palliative care related questions.???   ????   Thank you for the opportunity to participate in this patients care.   Nat Babe, BSN, [employer] [phone]

## 2024-11-06 NOTE — Progress Notes (Signed)
 " PROGRESS NOTE  [name] FMW:[id number] DOB: 1927-05-26   PCP: Okey Carlin Redbird, MD  Patient is from: Home.  DOA: 10/31/2024 LOS: 6  Chief complaints Chief Complaint  Patient presents with   Failure To Thrive     Brief Narrative / Interim history: 89yo with h/o chronic HFpEF, moderate AS, HTN, stage 3b CKD, ILD, and anemia who presented on 1/3 with SOB. CXR with small pleural effusion, elevated proBNP, troponin 116. Found to be in new afib, rate controlled. Given Lasix , cardiology consulted.  Patient was admitted with working diagnosis of acute on chronic HFpEF, acute respiratory failure with hypoxia and new onset atrial fibrillation.  TTE with LVEF of 40 to 45%, G2 DD, moderate pericardial effusion without evidence of tamponade.  She was diuresed with IV Lasix .  She was also started on low-dose metoprolol  with IV heparin  for new onset A-fib.    Hospital course complicated by agitation and delirium.  Given age and guarded prognosis, palliative medicine consulted, and  she was transitioned to full comfort care on 1/6 after discussion with patient's nephew, [name].  Nephew changed his mind and rescinded comfort care and hospice.  He is requesting SNF placement.  CODE STATUS updated DNR-Limited.  TOC working on SNF placement.   Subjective: Seen and examined earlier this morning.  No major events overnight or this morning.  No complaints.  She states she wants to go home before her nephew leaves to [place] .    Assessment and plan: Acute on chronic HFpEF: TTE with LVEF of 40 to 45%, G2 DD and moderate pericardial effusion without tamponade Acute hypoxic respiratory failure: Likely due to acute CHF. -Diuresed with IV Lasix  under the guidance of cardiology.  Currently euvolemic.  Hold off further diuretics. - Continue Toprol -XL 25 mg daily. -Appreciate help by cardiology and palliative medicine  New onset atrial fibrillation: Rate controlled.  TTE as above.  New diagnosis of  primary hypothyroidism. - Continue Toprol -XL - Treat hypothyroidism as below  Elevated troponin: Likely demand due to CHF.  No RWMA on TTE. -Manage CHF as above  Anemia of renal disease: Relatively stable. - Monitor intermittently.  Aortic stenosis-mild on TTE this admission.  CKD-3B: Relatively stable - Monitor intermittently.  Interstitial lung disease-followed by Dr. Shelah outpatient.  Mild cognitive impairment: Currently oriented x 4 except date. - Reorientation and delirium precaution.  Primary hypothyroidism: TSH 48,000.  Free T4 low at 0.77. -Resume Synthroid  at 50 mcg daily.  Dyslipidemia -Continue simvastatin   Essential hypertension: BP elevated. -Continue amlodipine  and Toprol -XL.  Elevated d-dimer: Nonspecific.  Clinically low suspicion for VTE.   End-of-life care/full comfort care: Was full comfort care from 1/6-1/9 before family/POA changed their mind. -TOC working with POA and nephew for disposition.   Body mass index is 19.7 kg/m.        Pressure skin injury: Wound 11/04/24 2330 Pressure Injury Vertebral column Right Stage 2 -  Partial thickness loss of dermis presenting as a shallow open injury with a red, pink wound bed without slough. (Active)   DVT prophylaxis:  heparin  injection 5,000 Units Start: 11/05/24 1500-patient and family refusing.  Code Status: DNR-comfort Family Communication: None at bedside Level of care: Palliative Care Status is: Inpatient Remains inpatient appropriate because: SNF   Final disposition: SNF   35 minutes with more than 50% spent in reviewing records, counseling patient/family and coordinating care.  Consultants:  Cardiology Palliative medicine  Procedures: None  Microbiology summarized: None  Objective: Vitals:   11/05/24 2132 11/05/24 2133  11/06/24 0411 11/06/24 1302  BP: (!) 159/71 (!) 148/64 122/64 (!) 144/60  Pulse: 84 84 73 66  Resp:   15 17  Temp:   98.7 F (37.1 C) 98.2 F (36.8 C)   TempSrc:   Oral Oral  SpO2:   95% 98%  Weight:      Height:        Examination:  GENERAL: Appears frail.  Sitting in bed. NECK: Supple.  No apparent JVD.  RESP:  No IWOB.  Fair aeration bilaterally. CVS:  RRR. Heart sounds normal.  ABD/GI/GU: BS+. Abd soft, NTND.  MSK/EXT:  No apparent deformity. No edema.  Significant muscle mass and subcu fat loss. SKIN: no apparent skin lesion or wound NEURO: Awake and alert.  Oriented x 4 except date.  Follows commands.  No apparent focal neurodeficit. PSYCH: Calm.  No distress or agitation.  Sch Meds:  Scheduled Meds:  amLODipine   5 mg Oral Daily   heparin  injection (subcutaneous)  5,000 Units Subcutaneous Q8H   levothyroxine   50 mcg Oral Q0600   metoprolol  succinate  25 mg Oral Daily    Continuous Infusions: PRN Meds:.acetaminophen , antiseptic oral rinse, artificial tears, lip balm, [DISCONTINUED] ondansetron  **OR** ondansetron  (ZOFRAN ) IV  Antimicrobials: Anti-infectives (From admission, onward)    Start     Dose/Rate Route Frequency Ordered Stop   11/01/24 0330  doxycycline  (VIBRAMYCIN ) 100 mg in sodium chloride  0.9 % 250 mL IVPB  Status:  Discontinued        100 mg 125 mL/hr over 120 Minutes Intravenous Every 12 hours 11/01/24 0232 11/01/24 1237        I have personally reviewed the following labs and images: CBC: Recent Labs  Lab 10/31/24 1811 11/01/24 0240 11/02/24 0424 11/05/24 1333  WBC 6.8 5.8 6.2 9.4  NEUTROABS 4.8  --   --   --   HGB 11.7* 10.3* 9.7* 11.0*  HCT 36.5 32.0* 30.5* 34.2*  MCV 91.5 91.4 90.8 90.7  PLT 316 275 258 257   BMP &GFR Recent Labs  Lab 10/31/24 1811 11/01/24 0240 11/02/24 0424 11/05/24 1333  NA 134* 134* 135 135  K 4.2 4.1 3.7 3.7  CL 99 100 98 94*  CO2 23 23 27 28   GLUCOSE 113* 91 90 213*  BUN 22 21 23  30*  CREATININE 1.26* 1.25* 1.44* 1.48*  CALCIUM 9.0 8.5* 8.6* 8.6*  MG  --  2.0  --  1.9   Estimated Creatinine Clearance: 17 mL/min (A) (by C-G formula based on SCr of  1.48 mg/dL (H)). Liver & Pancreas: Recent Labs  Lab 10/31/24 1811 11/01/24 0240 11/05/24 1333  AST 35 31 26  ALT 20 15 10   ALKPHOS 78 63 70  BILITOT 0.4 0.4 0.4  PROT 7.1 6.1* 6.3*  ALBUMIN 4.1 3.5 3.5   No results for input(s): LIPASE, AMYLASE in the last 168 hours. No results for input(s): AMMONIA in the last 168 hours. Diabetic: No results for input(s): HGBA1C in the last 72 hours. No results for input(s): GLUCAP in the last 168 hours. Cardiac Enzymes: No results for input(s): CKTOTAL, CKMB, CKMBINDEX, TROPONINI in the last 168 hours. Recent Labs    05/31/24 0900 06/03/24 1127 10/31/24 1811  PROBNP 2,478* 1,988* 10,738.0*   Coagulation Profile: No results for input(s): INR, PROTIME in the last 168 hours. Thyroid Function Tests: No results for input(s): TSH, T4TOTAL, FREET4, T3FREE, THYROIDAB in the last 72 hours.  Lipid Profile: No results for input(s): CHOL, HDL, LDLCALC, TRIG, CHOLHDL, LDLDIRECT in the  last 72 hours. Anemia Panel: No results for input(s): VITAMINB12, FOLATE, FERRITIN, TIBC, IRON, RETICCTPCT in the last 72 hours.  Urine analysis:    Component Value Date/Time   COLORURINE YELLOW 10/31/2024 1849   APPEARANCEUR HAZY (A) 10/31/2024 1849   LABSPEC 1.012 10/31/2024 1849   PHURINE 5.0 10/31/2024 1849   GLUCOSEU NEGATIVE 10/31/2024 1849   HGBUR NEGATIVE 10/31/2024 1849   BILIRUBINUR NEGATIVE 10/31/2024 1849   KETONESUR NEGATIVE 10/31/2024 1849   PROTEINUR >=300 (A) 10/31/2024 1849   NITRITE NEGATIVE 10/31/2024 1849   LEUKOCYTESUR NEGATIVE 10/31/2024 1849   Sepsis Labs: Invalid input(s): PROCALCITONIN, LACTICIDVEN  Microbiology: No results found for this or any previous visit (from the past 240 hours).  Radiology Studies: No results found.    Janetta Vandoren T. Mykel Sponaugle [facility]  If 7PM-7AM, please contact night-coverage [email or web address] 11/06/2024, 2:12 PM   "

## 2024-11-06 NOTE — Consult Note (Signed)
 WOC Nurse Consult Note: Reason for Consult: back wound  Wound type: Stage 1 R upper back with partial thickness skin loss red moist  Pressure Injury POA: no  Measurement: see nursing flowsheet  Wound bed: as above  Drainage (amount, consistency, odor) serosanguinous  Periwound: erythema  Dressing procedure/placement/frequency: Cleanse R back wound with NS, apply Xeroform gauze ([employer] [phone]) to wound bed daily and secure with silicone foam.   POC discussed with bedside nurse. WOC team will not follow. Reconsult if further needs arise.   Thank you,    Powell Bar MSN, RN-BC, [employer]

## 2024-11-07 NOTE — Progress Notes (Signed)
 " PROGRESS NOTE  NEA GITTENS FMW:[id number] DOB: Aug 24, 1927   PCP: Okey Carlin Redbird, MD  Patient is from: Home.  DOA: 10/31/2024 LOS: 7  Chief complaints Chief Complaint  Patient presents with   Failure To Thrive     Brief Narrative / Interim history: 89yo with h/o chronic HFpEF, moderate AS, HTN, stage 3b CKD, ILD, and anemia who presented on 1/3 with SOB. CXR with small pleural effusion, elevated proBNP, troponin 116. Found to be in new afib, rate controlled. Given Lasix , cardiology consulted.  Patient was admitted with working diagnosis of acute on chronic HFpEF, acute respiratory failure with hypoxia and new onset atrial fibrillation.  TTE with LVEF of 40 to 45%, G2 DD, moderate pericardial effusion without evidence of tamponade.  She was diuresed with IV Lasix .  She was also started on low-dose metoprolol  with IV heparin  for new onset A-fib.    Hospital course complicated by agitation and delirium.  Given age and guarded prognosis, palliative medicine consulted, and  she was transitioned to full comfort care on 1/6 after discussion with patient's nephew, [name].  Nephew changed his mind and rescinded comfort care and hospice.  He is requesting SNF placement.  CODE STATUS updated DNR-Limited.  TOC working on SNF placement.   Subjective: Seen and examined earlier this morning.  No major events overnight or this morning.  Patient is eager to go home/rehab.  Repeatedly questions why if he is taking long to get out of the hospital.   Assessment and plan: Acute on chronic HFpEF: TTE with LVEF of 40 to 45%, G2 DD and moderate pericardial effusion without tamponade Acute hypoxic respiratory failure: Likely due to acute CHF. -Diuresed with IV Lasix  under the guidance of cardiology.  Currently euvolemic.  Hold off further diuretics. -Continue Toprol -XL 25 mg daily. -Appreciate help by cardiology and palliative medicine  New onset atrial fibrillation: Rate controlled.  TTE as above.   New diagnosis of primary hypothyroidism. - Continue Toprol -XL - Treat hypothyroidism as below  Elevated troponin: Likely demand due to CHF.  No RWMA on TTE. -Manage CHF as above  Anemia of renal disease: Relatively stable. - Monitor intermittently.  Aortic stenosis-mild on TTE this admission.  CKD-3B: Relatively stable - Monitor intermittently.  Interstitial lung disease-followed by Dr. Shelah outpatient.  Mild cognitive impairment: Currently oriented x 4 except date. - Reorientation and delirium precaution.  Primary hypothyroidism: TSH 48,000.  Free T4 low at 0.77. -Resume Synthroid  at 50 mcg daily.  Dyslipidemia -Continue simvastatin   Essential hypertension: BP elevated. -Continue amlodipine  and Toprol -XL.  Elevated d-dimer: Nonspecific.  Clinically low suspicion for VTE.   End-of-life care/full comfort care: Was full comfort care from 1/6-1/9 before family/POA changed their mind. -TOC working with POA and nephew for disposition.   Body mass index is 19.7 kg/m.        Pressure skin injury: Wound 11/04/24 2330 Pressure Injury Vertebral column Right Stage 2 -  Partial thickness loss of dermis presenting as a shallow open injury with a red, pink wound bed without slough. (Active)   DVT prophylaxis:  heparin  injection 5,000 Units Start: 11/05/24 1500-patient and family refusing.  Code Status: DNR-comfort Family Communication: None at bedside Level of care: Palliative Care Status is: Inpatient Remains inpatient appropriate because: SNF   Final disposition: SNF.  Medically stable for discharge.   35 minutes with more than 50% spent in reviewing records, counseling patient/family and coordinating care.  Consultants:  Cardiology Palliative medicine  Procedures: None  Microbiology summarized: None  Objective: Vitals:   11/06/24 1302 11/06/24 2012 11/07/24 0615 11/07/24 1444  BP: (!) 144/60 (!) 146/62 (!) 133/59 124/60  Pulse: 66 67 74 62  Resp: 17 15  16 16   Temp: 98.2 F (36.8 C) 98.8 F (37.1 C) 99.8 F (37.7 C) 98.6 F (37 C)  TempSrc: Oral Oral Oral Oral  SpO2: 98% 95% 94% 96%  Weight:      Height:        Examination:  GENERAL: Appears frail.  Sitting in bed. NECK: Supple.  No apparent JVD.  RESP:  No IWOB.  Fair aeration bilaterally. CVS:  RRR. Heart sounds normal.  ABD/GI/GU: BS+. Abd soft, NTND.  MSK/EXT:  No apparent deformity. No edema.  Significant muscle mass and subcu fat loss. SKIN: no apparent skin lesion or wound NEURO: Awake and alert.  Oriented appropriately.  Follows commands.  No apparent focal neurodeficit. PSYCH: Calm.  No distress or agitation.  Sch Meds:  Scheduled Meds:  amLODipine   5 mg Oral Daily   heparin  injection (subcutaneous)  5,000 Units Subcutaneous Q8H   levothyroxine   50 mcg Oral Q0600   metoprolol  succinate  25 mg Oral Daily    Continuous Infusions: PRN Meds:.acetaminophen , antiseptic oral rinse, artificial tears, lip balm, [DISCONTINUED] ondansetron  **OR** ondansetron  (ZOFRAN ) IV  Antimicrobials: Anti-infectives (From admission, onward)    Start     Dose/Rate Route Frequency Ordered Stop   11/01/24 0330  doxycycline  (VIBRAMYCIN ) 100 mg in sodium chloride  0.9 % 250 mL IVPB  Status:  Discontinued        100 mg 125 mL/hr over 120 Minutes Intravenous Every 12 hours 11/01/24 0232 11/01/24 1237        I have personally reviewed the following labs and images: CBC: Recent Labs  Lab 10/31/24 1811 11/01/24 0240 11/02/24 0424 11/05/24 1333  WBC 6.8 5.8 6.2 9.4  NEUTROABS 4.8  --   --   --   HGB 11.7* 10.3* 9.7* 11.0*  HCT 36.5 32.0* 30.5* 34.2*  MCV 91.5 91.4 90.8 90.7  PLT 316 275 258 257   BMP &GFR Recent Labs  Lab 10/31/24 1811 11/01/24 0240 11/02/24 0424 11/05/24 1333  NA 134* 134* 135 135  K 4.2 4.1 3.7 3.7  CL 99 100 98 94*  CO2 23 23 27 28   GLUCOSE 113* 91 90 213*  BUN 22 21 23  30*  CREATININE 1.26* 1.25* 1.44* 1.48*  CALCIUM 9.0 8.5* 8.6* 8.6*  MG  --   2.0  --  1.9   Estimated Creatinine Clearance: 17 mL/min (A) (by C-G formula based on SCr of 1.48 mg/dL (H)). Liver & Pancreas: Recent Labs  Lab 10/31/24 1811 11/01/24 0240 11/05/24 1333  AST 35 31 26  ALT 20 15 10   ALKPHOS 78 63 70  BILITOT 0.4 0.4 0.4  PROT 7.1 6.1* 6.3*  ALBUMIN 4.1 3.5 3.5   No results for input(s): LIPASE, AMYLASE in the last 168 hours. No results for input(s): AMMONIA in the last 168 hours. Diabetic: No results for input(s): HGBA1C in the last 72 hours. No results for input(s): GLUCAP in the last 168 hours. Cardiac Enzymes: No results for input(s): CKTOTAL, CKMB, CKMBINDEX, TROPONINI in the last 168 hours. Recent Labs    05/31/24 0900 06/03/24 1127 10/31/24 1811  PROBNP 2,478* 1,988* 10,738.0*   Coagulation Profile: No results for input(s): INR, PROTIME in the last 168 hours. Thyroid Function Tests: No results for input(s): TSH, T4TOTAL, FREET4, T3FREE, THYROIDAB in the last 72 hours.  Lipid Profile: No  results for input(s): CHOL, HDL, LDLCALC, TRIG, CHOLHDL, LDLDIRECT in the last 72 hours. Anemia Panel: No results for input(s): VITAMINB12, FOLATE, FERRITIN, TIBC, IRON, RETICCTPCT in the last 72 hours.  Urine analysis:    Component Value Date/Time   COLORURINE YELLOW 10/31/2024 1849   APPEARANCEUR HAZY (A) 10/31/2024 1849   LABSPEC 1.012 10/31/2024 1849   PHURINE 5.0 10/31/2024 1849   GLUCOSEU NEGATIVE 10/31/2024 1849   HGBUR NEGATIVE 10/31/2024 1849   BILIRUBINUR NEGATIVE 10/31/2024 1849   KETONESUR NEGATIVE 10/31/2024 1849   PROTEINUR >=300 (A) 10/31/2024 1849   NITRITE NEGATIVE 10/31/2024 1849   LEUKOCYTESUR NEGATIVE 10/31/2024 1849   Sepsis Labs: Invalid input(s): PROCALCITONIN, LACTICIDVEN  Microbiology: No results found for this or any previous visit (from the past 240 hours).  Radiology Studies: No results found.    Quaniyah Bugh T. Jaxten Brosh [facility]  If 7PM-7AM,  please contact night-coverage [email or web address] 11/07/2024, 3:30 PM   "

## 2024-11-07 NOTE — TOC Progression Note (Signed)
 Transition of Care [facility]) - Progression Note    Patient Details  Name: VEGA STARE MRN: [id number] Date of Birth: July 01, 1927  Transition of Care [facility]) CM/SW Contact  Lorraine LILLETTE Fenton, LCSW Phone Number: 11/07/2024, 11:07 AM  Clinical Narrative:    Plan is to DC to SNF for rehab then return home, at that time involvement with home hospice potentially.  CSW spoke with nephew and updated this in Epic.  CSW also provided a link to medicare.com for facility ratings. CSW then uploaded level 2 clinical documents. ICM following.   Expected Discharge Plan: Home w Hospice Care Barriers to Discharge: Continued Medical Work up               Expected Discharge Plan and Services       Living arrangements for the past 2 months: Single Family Home                                       Social Drivers of Health (SDOH) Interventions SDOH Screenings   Food Insecurity: Patient Unable To Answer (10/31/2024)  Housing: Unknown (10/31/2024)  Transportation Needs: Patient Unable To Answer (10/31/2024)  Utilities: Patient Unable To Answer (10/31/2024)  Social Connections: Unknown (10/31/2024)  Tobacco Use: Low Risk (10/31/2024)    Readmission Risk Interventions     No data to display

## 2024-11-08 DIAGNOSIS — I10 Essential (primary) hypertension: Secondary | ICD-10-CM

## 2024-11-08 MED ORDER — LEVOTHYROXINE SODIUM 50 MCG PO TABS: 50.0000 ug | ORAL_TABLET | Freq: Every day | ORAL | Status: DC

## 2024-11-08 MED ORDER — DOCUSATE SODIUM 100 MG PO CAPS
100.0000 mg | ORAL_CAPSULE | Freq: Two times a day (BID) | ORAL | Status: DC
  Filled 2024-11-08: qty 1

## 2024-11-08 MED ORDER — SENNOSIDES-DOCUSATE SODIUM 8.6-50 MG PO TABS: 1.0000 | ORAL_TABLET | Freq: Two times a day (BID) | ORAL | Status: DC | PRN

## 2024-11-08 MED ORDER — METOPROLOL SUCCINATE ER 25 MG PO TB24: 25.0000 mg | ORAL_TABLET | Freq: Every day | ORAL | Status: AC

## 2024-11-08 MED ORDER — LACTULOSE 10 GM/15ML PO SOLN: 10.0000 g | Freq: Every day | ORAL | Status: DC | PRN

## 2024-11-08 MED ORDER — POLYETHYLENE GLYCOL 3350 17 GM/SCOOP PO POWD: 17.0000 g | Freq: Two times a day (BID) | ORAL | Status: DC | PRN

## 2024-11-08 NOTE — Care Management Important Message (Signed)
 Important Message  Patient Details No more IM Letter's given due to Palliative Care. Name: [name] MRN: [id number] Date of Birth: [birth year]   Important Message Given:  No     [name] 11/08/2024, 11:22 AM

## 2024-11-08 NOTE — Discharge Summary (Signed)
 "  Physician Discharge Summary  [name] FMW:[id number] DOB: 01-11-1927 DOA: 10/31/2024  PCP: Victoria Carlin Redbird, MD  Admit date: 10/31/2024 Discharge date: 11/08/2024  Admitted From: Home. Disposition: SNF. Recommendations for Outpatient Follow-up:  [facility] at [facility]. Check CMP and CBC in 1 week Please follow up on the following pending results: None   Discharge Condition: Stable CODE STATUS: DNR   Contact information for after-discharge care     Destination     [facility] [place] .   Service: Skilled Nursing Contact information: [name] [place]  [address] [phone]                     Hospital course 89yo with h/o chronic HFpEF, moderate AS, HTN, stage 3b CKD, ILD, and anemia who presented on 1/3 with SOB. CXR with small pleural effusion, elevated proBNP, troponin 116. Found to be in new afib, rate controlled. Given Lasix , cardiology consulted.  Patient was admitted with working diagnosis of acute on chronic HFpEF, acute respiratory failure with hypoxia and new onset atrial fibrillation.  TTE with LVEF of 40 to 45%, G2 DD, moderate pericardial effusion without evidence of tamponade.  She was diuresed with IV Lasix .  She was also started on low-dose metoprolol  with IV heparin  for new onset A-fib.     Hospital course complicated by agitation and delirium.  Given age and guarded prognosis, palliative medicine consulted, and  she was transitioned to full comfort care on 1/6 after discussion with patient's nephew, [name].   Nephew changed his mind and rescinded comfort care and hospice.  He is requesting SNF placement.  CODE STATUS updated DNR-Limited.  Patient discharged to SNF with palliative follow-up.   See individual problem list below for more.   Problems addressed during this hospitalization Acute on chronic HFpEF: TTE with LVEF of 40 to 45%, G2-DD and moderate pericardial effusion without  tamponade Acute hypoxic respiratory failure: Likely due to acute CHF.  Resolved. -Diuresed with IV Lasix  under the guidance of cardiology.  Currently euvolemic and remains stable off diuretics. -Continue Toprol -XL 25 mg daily. -Appreciate help by cardiology and palliative medicine   New onset atrial fibrillation: Rate controlled.  TTE as above.  New diagnosis of primary hypothyroidism. -Continue Toprol -XL -Treat hypothyroidism as below -Defer anticoagulation due to anemia and guarded prognosis.   Elevated troponin: Likely demand due to CHF.  No RWMA on TTE. -Manage CHF as above   Anemia of renal disease: Relatively stable. - Monitor intermittently.   Aortic stenosis-mild on TTE this admission.   CKD-3B: Relatively stable - Monitor intermittently.   Interstitial lung disease-followed by [name] outpatient.   Mild cognitive impairment: Currently oriented x 4 except date. - Reorientation and delirium precaution.   Primary hypothyroidism: TSH 48,000.  Free T4 low at 0.77. -Continue Synthroid  50 mcg daily. -Recheck TSH in 4 to 6 weeks.   Dyslipidemia -Continue simvastatin    Essential hypertension: BP elevated. -Continue amlodipine  and Toprol -XL.   Elevated d-dimer: Nonspecific.  Clinically low suspicion for VTE.    End-of-life care/full comfort care: Was full comfort care from 1/6-1/9 before family/POA changed their mind. -TOC working with POA and nephew for disposition.  Body mass index is 19.7 kg/m.       Stage II pressure skin injury: Wound 11/04/24 2330 Pressure Injury Vertebral column Right Stage 2 -  Partial thickness loss of dermis presenting as a shallow open injury with a red, pink wound bed without slough. (Active)  Consultations: Palliative medicine  Time spent 35  minutes  Vital signs Vitals:   11/07/24 0615 11/07/24 1444 11/07/24 2024 11/08/24 0627  BP: (!) 133/59 124/60 131/64 (!) 144/64  Pulse: 74 62 73 76  Temp: 99.8 F (37.7 C) 98.6 F (37  C) 98.6 F (37 C) 98.4 F (36.9 C)  Resp: 16 16 17 19   Height:      Weight:      SpO2: 94% 96% 95% 96%  TempSrc: Oral Oral Oral Oral  BMI (Calculated):         Discharge exam  GENERAL: Appears frail.  Sitting in bed. NECK: Supple.  No apparent JVD.  RESP:  No IWOB.  Fair aeration bilaterally. CVS:  RRR. Heart sounds normal.  ABD/GI/GU: BS+. Abd soft, NTND.  MSK/EXT:  No apparent deformity. No edema.  Significant muscle mass and subcu fat loss. SKIN: no apparent skin lesion or wound NEURO: Awake and alert.  Oriented appropriately.  Follows commands.  No apparent focal neurodeficit. PSYCH: Calm.  No distress or agitation.  Discharge Instructions Discharge Instructions     Discharge wound care:   Complete by: As directed    Wound care  Daily      Comments: Cleanse R back wound with NS, apply Xeroform gauze Soila [phone]) to wound bed daily and secure with silicone foam   Increase activity slowly   Complete by: As directed       Allergies as of 11/08/2024       Reactions   Codeine Other (See Comments)   Insomnia         Medication List     STOP taking these medications    Integra Plus  Caps   valsartan  320 MG tablet Commonly known as: Diovan        TAKE these medications    albuterol  108 (90 Base) MCG/ACT inhaler Commonly known as: VENTOLIN  HFA Inhale 1-2 puffs into the lungs every 6 (six) hours as needed for wheezing or shortness of breath.   amLODipine  5 MG tablet Commonly known as: NORVASC  Take 1 tablet (5 mg total) by mouth daily.   cholecalciferol  1000 units tablet Commonly known as: VITAMIN D  Take 5,000 Units by mouth once a week.   levothyroxine  50 MCG tablet Commonly known as: SYNTHROID  Take 1 tablet (50 mcg total) by mouth daily at 6 (six) AM. Start taking on: November 09, 2024   metoprolol  succinate 25 MG 24 hr tablet Commonly known as: TOPROL -XL Take 1 tablet (25 mg total) by mouth daily.   multivitamin with minerals Tabs tablet Take  1 tablet by mouth daily.   mupirocin  ointment 2 % Commonly known as: BACTROBAN  Apply 1 Application topically 2 (two) times daily.   polyethylene glycol powder 17 GM/SCOOP powder Commonly known as: MiraLax  Take 17 g by mouth 2 (two) times daily as needed for moderate constipation.   senna-docusate 8.6-50 MG tablet Commonly known as: Senokot-S Take 1 tablet by mouth 2 (two) times daily between meals as needed for mild constipation.   simvastatin  20 MG tablet Commonly known as: ZOCOR  Take 20 mg by mouth daily.   trolamine salicylate 10 % cream Commonly known as: ASPERCREME Apply 1 application topically daily as needed for muscle pain.               Discharge Care Instructions  (From admission, onward)           Start     Ordered   11/08/24 0000  Discharge wound care:  Comments: Wound care  Daily      Comments: Cleanse R back wound with NS, apply Xeroform gauze (Lawson [phone]) to wound bed daily and secure with silicone foam   11/08/24 1009             Procedures/Studies:   ECHOCARDIOGRAM COMPLETE Result Date: 11/01/2024    ECHOCARDIOGRAM REPORT   Patient Name:   JAZZMAN LOUGHMILLER Date of Exam: 11/01/2024 Medical Rec #:  [id number]       Height:       61.0 in Accession #:    [id number]      Weight:       108.2 lb Date of Birth:  Jul 28, 1927       BSA:          1.455 m Patient Age:    89 years        BP:           140/76 mmHg Patient Gender: F               HR:           63 bpm. Exam Location:  Inpatient Procedure: 2D Echo, Cardiac Doppler and Color Doppler (Both Spectral and Color            Flow Doppler were utilized during procedure). Indications:    Aortic stenosis I35.0                 CHF-Acute Diastolic I50.31                 Atrial Fibrillation I48.91  History:        Patient has prior history of Echocardiogram examinations, most                 recent 06/03/2024. Signs/Symptoms:Murmur; Risk                 Factors:Hypertension.  Sonographer:    Sydnee Wilson RDCS  Referring Phys: [id number] TAYLOR A PARCELLS IMPRESSIONS  1. Left ventricular ejection fraction, by estimation, is 40 to 45%. The left ventricle has mildly decreased function. The left ventricle demonstrates global hypokinesis. There is mild left ventricular hypertrophy. Left ventricular diastolic parameters are consistent with Grade II diastolic dysfunction (pseudonormalization).  2. Right ventricular systolic function is normal. The right ventricular size is normal. There is mildly elevated pulmonary artery systolic pressure. The estimated right ventricular systolic pressure is 42.9 mmHg.  3. Left atrial size was moderately dilated.  4. Moderate pericardial effusion. The pericardial effusion is posterior to the left ventricle and the left atrium. There is no evidence of cardiac tamponade. Moderate pleural effusion in the left lateral region.  5. The mitral valve is normal in structure. Trivial mitral valve regurgitation. No evidence of mitral stenosis.  6. The aortic valve is tricuspid. There is moderate calcification of the aortic valve. There is moderate thickening of the aortic valve. Aortic valve regurgitation is trivial. Mild aortic valve stenosis. Aortic valve mean gradient measures 11.0 mmHg. Aortic valve Vmax measures 2.08 m/s.  7. The inferior vena cava is normal in size with greater than 50% respiratory variability, suggesting right atrial pressure of 3 mmHg. FINDINGS  Left Ventricle: Left ventricular ejection fraction, by estimation, is 40 to 45%. The left ventricle has mildly decreased function. The left ventricle demonstrates global hypokinesis. The left ventricular internal cavity size was normal in size. There is  mild left ventricular hypertrophy. Left ventricular diastolic parameters are consistent with Grade II diastolic dysfunction (pseudonormalization). Right Ventricle:  The right ventricular size is normal. No increase in right ventricular wall thickness. Right ventricular systolic function is  normal. There is mildly elevated pulmonary artery systolic pressure. The tricuspid regurgitant velocity is 3.16  m/s, and with an assumed right atrial pressure of 3 mmHg, the estimated right ventricular systolic pressure is 42.9 mmHg. Left Atrium: Left atrial size was moderately dilated. Right Atrium: Right atrial size was normal in size. Pericardium: A moderately sized pericardial effusion is present. The pericardial effusion is posterior to the left ventricle and the left atrium. There is no evidence of cardiac tamponade. Mitral Valve: The mitral valve is normal in structure. Trivial mitral valve regurgitation. No evidence of mitral valve stenosis. Tricuspid Valve: The tricuspid valve is normal in structure. Tricuspid valve regurgitation is mild . No evidence of tricuspid stenosis. Aortic Valve: The aortic valve is tricuspid. There is moderate calcification of the aortic valve. There is moderate thickening of the aortic valve. Aortic valve regurgitation is trivial. Mild aortic stenosis is present. Aortic valve mean gradient measures 11.0 mmHg. Aortic valve peak gradient measures 17.3 mmHg. Aortic valve area, by VTI measures 1.39 cm. Pulmonic Valve: The pulmonic valve was normal in structure. Pulmonic valve regurgitation is trivial. No evidence of pulmonic stenosis. Aorta: The aortic root is normal in size and structure. Venous: The inferior vena cava is normal in size with greater than 50% respiratory variability, suggesting right atrial pressure of 3 mmHg. IAS/Shunts: No atrial level shunt detected by color flow Doppler. Additional Comments: There is a moderate pleural effusion in the left lateral region.  LEFT VENTRICLE PLAX 2D LVIDd:         4.20 cm     Diastology LVIDs:         3.60 cm     LV e' medial:    4.57 cm/s LV PW:         1.20 cm     LV E/e' medial:  20.4 LV IVS:        0.80 cm     LV e' lateral:   6.74 cm/s LVOT diam:     2.00 cm     LV E/e' lateral: 13.9 LV SV:         80 LV SV Index:   55 LVOT  Area:     3.14 cm  LV Volumes (MOD) LV vol d, MOD A2C: 82.9 ml LV vol d, MOD A4C: 60.0 ml LV vol s, MOD A2C: 36.6 ml LV vol s, MOD A4C: 34.3 ml LV SV MOD A2C:     46.3 ml LV SV MOD A4C:     60.0 ml LV SV MOD BP:      33.3 ml RIGHT VENTRICLE RV S prime:     6.31 cm/s TAPSE (M-mode): 1.0 cm LEFT ATRIUM             Index        RIGHT ATRIUM           Index LA diam:        4.50 cm 3.09 cm/m   RA Area:     14.90 cm LA Vol (A2C):   64.0 ml 43.99 ml/m  RA Volume:   37.10 ml  25.50 ml/m LA Vol (A4C):   33.2 ml 22.82 ml/m LA Biplane Vol: 50.1 ml 34.43 ml/m  AORTIC VALVE AV Area (Vmax):    1.47 cm AV Area (Vmean):   1.40 cm AV Area (VTI):     1.39 cm AV Vmax:  208.00 cm/s AV Vmean:          159.667 cm/s AV VTI:            0.576 m AV Peak Grad:      17.3 mmHg AV Mean Grad:      11.0 mmHg LVOT Vmax:         97.60 cm/s LVOT Vmean:        71.333 cm/s LVOT VTI:          0.254 m LVOT/AV VTI ratio: 0.44  AORTA Ao Root diam: 2.50 cm Ao Asc diam:  2.70 cm MITRAL VALVE               TRICUSPID VALVE MV Area (PHT): 4.31 cm    TR Peak grad:   39.9 mmHg MV E velocity: 93.40 cm/s  TR Vmax:        316.00 cm/s MV A velocity: 33.40 cm/s MV E/A ratio:  2.80        SHUNTS                            Systemic VTI:  0.25 m                            Systemic Diam: 2.00 cm Oneil Parchment MD Electronically signed by Oneil Parchment MD Signature Date/Time: 11/01/2024/4:21:00 PM    Final    CT Head Wo Contrast Result Date: 10/31/2024 EXAM: CT HEAD WITHOUT CONTRAST 10/31/2024 05:41:59 PM TECHNIQUE: CT of the head was performed without the administration of intravenous contrast. Automated exposure control, iterative reconstruction, and/or weight based adjustment of the mA/kV was utilized to reduce the radiation dose to as low as reasonably achievable. COMPARISON: CT head without contrast 05/11/2022. CLINICAL HISTORY: Mental status change, unknown cause. Decreased appetite and increased memory loss. FINDINGS: BRAIN AND VENTRICLES: Moderate  chronic microvascular ischemic change. Generalized volume loss without lobar predominance, consistent with moderate atrophy. White matter changes demonstrate slight progression since the prior exam. No acute hemorrhage. No evidence of acute infarct. No hydrocephalus. No extra-axial collection. No mass effect or midline shift. ORBITS: Bilateral lens replacement noted. SINUSES: Air-fluid level in right sphenoid sinus, compatible with acute sinusitis. SOFT TISSUES AND SKULL: No acute soft tissue abnormality. No skull fracture. IMPRESSION: 1. No acute intracranial abnormality. 2. Moderate chronic microvascular ischemic change and generalized volume loss without lobar predominance, slightly progressed. 3. Right sphenoid sinus air-fluid level, consistent with acute sinusitis. Electronically signed by: Lonni Necessary MD 10/31/2024 06:02 PM EST RP Workstation: [id number]   DG Chest Portable 1 View Result Date: 10/31/2024 CLINICAL DATA:  Shortness of breath.  Bilateral pitting edema. EXAM: PORTABLE CHEST 1 VIEW COMPARISON:  06/14/2024. FINDINGS: Heart is enlarged and the mediastinal contour stable. Atherosclerotic calcification of the aorta is noted. The pulmonary vasculature is distended. Interstitial prominence is noted bilaterally and increased from the prior exams. There is a small right pleural effusion and moderate left pleural effusion with a associated atelectasis or infiltrate. No pneumothorax is seen. Shoulder arthroplasty changes are noted on the right IMPRESSION: 1. Cardiomegaly with pulmonary vascular congestion. 2. Increased interstitial prominence bilaterally with airspace disease at the lung bases, possible edema or infiltrate. 3. Small right pleural effusion and moderate left pleural effusion. Electronically Signed   By: Leita Birmingham M.D.   On: 10/31/2024 17:35       The results of significant diagnostics from this hospitalization (including imaging,  microbiology, ancillary and laboratory) are  listed below for reference.     Microbiology: No results found for this or any previous visit (from the past 240 hours).   Labs:  CBC: Recent Labs  Lab 11/02/24 0424 11/05/24 1333  WBC 6.2 9.4  HGB 9.7* 11.0*  HCT 30.5* 34.2*  MCV 90.8 90.7  PLT 258 257   BMP &GFR Recent Labs  Lab 11/02/24 0424 11/05/24 1333  NA 135 135  K 3.7 3.7  CL 98 94*  CO2 27 28  GLUCOSE 90 213*  BUN 23 30*  CREATININE 1.44* 1.48*  CALCIUM 8.6* 8.6*  MG  --  1.9   Estimated Creatinine Clearance: 17 mL/min (A) (by C-G formula based on SCr of 1.48 mg/dL (H)). Liver & Pancreas: Recent Labs  Lab 11/05/24 1333  AST 26  ALT 10  ALKPHOS 70  BILITOT 0.4  PROT 6.3*  ALBUMIN 3.5   No results for input(s): LIPASE, AMYLASE in the last 168 hours. No results for input(s): AMMONIA in the last 168 hours. Diabetic: No results for input(s): HGBA1C in the last 72 hours. No results for input(s): GLUCAP in the last 168 hours. Cardiac Enzymes: No results for input(s): CKTOTAL, CKMB, CKMBINDEX, TROPONINI in the last 168 hours. Recent Labs    05/31/24 0900 06/03/24 1127 10/31/24 1811  PROBNP 2,478* 1,988* 10,738.0*   Coagulation Profile: No results for input(s): INR, PROTIME in the last 168 hours. Thyroid Function Tests: No results for input(s): TSH, T4TOTAL, FREET4, T3FREE, THYROIDAB in the last 72 hours. Lipid Profile: No results for input(s): CHOL, HDL, LDLCALC, TRIG, CHOLHDL, LDLDIRECT in the last 72 hours. Anemia Panel: No results for input(s): VITAMINB12, FOLATE, FERRITIN, TIBC, IRON, RETICCTPCT in the last 72 hours. Urine analysis:    Component Value Date/Time   COLORURINE YELLOW 10/31/2024 1849   APPEARANCEUR HAZY (A) 10/31/2024 1849   LABSPEC 1.012 10/31/2024 1849   PHURINE 5.0 10/31/2024 1849   GLUCOSEU NEGATIVE 10/31/2024 1849   HGBUR NEGATIVE 10/31/2024 1849   BILIRUBINUR NEGATIVE 10/31/2024 1849   KETONESUR NEGATIVE  10/31/2024 1849   PROTEINUR >=300 (A) 10/31/2024 1849   NITRITE NEGATIVE 10/31/2024 1849   LEUKOCYTESUR NEGATIVE 10/31/2024 1849   Sepsis Labs: Invalid input(s): PROCALCITONIN, LACTICIDVEN   SIGNED:  Mignon ONEIDA Bump, MD  [facility] 11/08/2024, 10:10 AM   "

## 2024-11-08 NOTE — TOC Transition Note (Addendum)
 Transition of Care [facility]) - Discharge Note   Patient Details  Name: [name] MRN: [id number] Date of Birth: [birth year]  Transition of Care [facility]) CM/SW Contact:  Tawni [name], LCSW Phone Number: 11/08/2024, 9:36 AM   Clinical Narrative:     Pt's PASRR was assigned [id number]. CSW spoke with pt's nephew , he has chosen [facility]. Message sent to [facility], facility is able to be admitted today.   Pt's room 1202P RN to call report to [phone]. CSW spoke with pt's nephew [name] who agrees with d/c plan to [facility] with OP palliative services. PTAR called, no further ICM needs ICM sign off.      Final next level of care: Home w Home Health Services Barriers to Discharge: Barriers Resolved   Patient Goals and CMS Choice Patient states their goals for this hospitalization and ongoing recovery are:: SNF with OP pallitive CMS Medicare.gov Compare Post Acute Care list provided to:: Patient Represenative (must comment) Choice offered to / list presented to : Patient, [facility] POA / Guardian      Discharge Placement PASRR number recieved: 11/08/24            Patient chooses bed at: [facility] Patient to be transferred to facility by: EMS Name of family member notified: Marikay [name] [place], Emergency Contact  [phone] (Mobile) Patient and family notified of of transfer: 11/08/24  Discharge Plan and Services Additional resources added to the After Visit Summary for                                       Social Drivers of Health (SDOH) Interventions SDOH Screenings   Food Insecurity: Patient Unable To Answer (10/31/2024)  Housing: Unknown (10/31/2024)  Transportation Needs: Patient Unable To Answer (10/31/2024)  Utilities: Patient Unable To Answer (10/31/2024)  Social Connections: Unknown (10/31/2024)  Tobacco Use: Low Risk (10/31/2024)     Readmission Risk Interventions     No data to display

## 2024-11-14 ENCOUNTER — Emergency Department (HOSPITAL_COMMUNITY)

## 2024-11-14 ENCOUNTER — Inpatient Hospital Stay (HOSPITAL_COMMUNITY)
Admission: EM | Admit: 2024-11-14 | Discharge: 2024-12-02 | DRG: 640 | Disposition: A | Attending: Family Medicine | Admitting: Family Medicine

## 2024-11-14 ENCOUNTER — Encounter (HOSPITAL_COMMUNITY): Payer: Self-pay

## 2024-11-14 DIAGNOSIS — Z885 Allergy status to narcotic agent status: Secondary | ICD-10-CM

## 2024-11-14 DIAGNOSIS — J849 Interstitial pulmonary disease, unspecified: Secondary | ICD-10-CM | POA: Diagnosis present

## 2024-11-14 DIAGNOSIS — I130 Hypertensive heart and chronic kidney disease with heart failure and stage 1 through stage 4 chronic kidney disease, or unspecified chronic kidney disease: Secondary | ICD-10-CM | POA: Diagnosis present

## 2024-11-14 DIAGNOSIS — R627 Adult failure to thrive: Principal | ICD-10-CM | POA: Diagnosis present

## 2024-11-14 DIAGNOSIS — E7800 Pure hypercholesterolemia, unspecified: Secondary | ICD-10-CM | POA: Diagnosis present

## 2024-11-14 DIAGNOSIS — I350 Nonrheumatic aortic (valve) stenosis: Secondary | ICD-10-CM | POA: Diagnosis present

## 2024-11-14 DIAGNOSIS — N1832 Chronic kidney disease, stage 3b: Secondary | ICD-10-CM | POA: Diagnosis present

## 2024-11-14 DIAGNOSIS — I480 Paroxysmal atrial fibrillation: Secondary | ICD-10-CM | POA: Diagnosis present

## 2024-11-14 DIAGNOSIS — Z96611 Presence of right artificial shoulder joint: Secondary | ICD-10-CM | POA: Diagnosis present

## 2024-11-14 DIAGNOSIS — M545 Low back pain, unspecified: Secondary | ICD-10-CM | POA: Diagnosis present

## 2024-11-14 DIAGNOSIS — D631 Anemia in chronic kidney disease: Secondary | ICD-10-CM | POA: Diagnosis present

## 2024-11-14 DIAGNOSIS — I4891 Unspecified atrial fibrillation: Secondary | ICD-10-CM | POA: Diagnosis present

## 2024-11-14 DIAGNOSIS — I10 Essential (primary) hypertension: Secondary | ICD-10-CM | POA: Diagnosis present

## 2024-11-14 DIAGNOSIS — Z79890 Hormone replacement therapy (postmenopausal): Secondary | ICD-10-CM

## 2024-11-14 DIAGNOSIS — F0390 Unspecified dementia without behavioral disturbance: Secondary | ICD-10-CM | POA: Diagnosis present

## 2024-11-14 DIAGNOSIS — Z66 Do not resuscitate: Secondary | ICD-10-CM | POA: Diagnosis present

## 2024-11-14 DIAGNOSIS — K219 Gastro-esophageal reflux disease without esophagitis: Secondary | ICD-10-CM | POA: Diagnosis present

## 2024-11-14 DIAGNOSIS — R4689 Other symptoms and signs involving appearance and behavior: Principal | ICD-10-CM

## 2024-11-14 DIAGNOSIS — E785 Hyperlipidemia, unspecified: Secondary | ICD-10-CM | POA: Diagnosis present

## 2024-11-14 DIAGNOSIS — Z515 Encounter for palliative care: Secondary | ICD-10-CM

## 2024-11-14 DIAGNOSIS — R64 Cachexia: Secondary | ICD-10-CM | POA: Diagnosis present

## 2024-11-14 DIAGNOSIS — E039 Hypothyroidism, unspecified: Secondary | ICD-10-CM | POA: Diagnosis present

## 2024-11-14 DIAGNOSIS — Z681 Body mass index (BMI) 19 or less, adult: Secondary | ICD-10-CM

## 2024-11-14 DIAGNOSIS — E559 Vitamin D deficiency, unspecified: Secondary | ICD-10-CM | POA: Diagnosis present

## 2024-11-14 DIAGNOSIS — Z79899 Other long term (current) drug therapy: Secondary | ICD-10-CM

## 2024-11-14 DIAGNOSIS — I5032 Chronic diastolic (congestive) heart failure: Secondary | ICD-10-CM | POA: Diagnosis present

## 2024-11-14 DIAGNOSIS — J810 Acute pulmonary edema: Secondary | ICD-10-CM

## 2024-11-14 DIAGNOSIS — G9341 Metabolic encephalopathy: Secondary | ICD-10-CM | POA: Diagnosis present

## 2024-11-14 LAB — CBC WITH DIFFERENTIAL/PLATELET
Abs Immature Granulocytes: 0.01 K/uL (ref 0.00–0.07)
Basophils Absolute: 0 K/uL (ref 0.0–0.1)
Basophils Relative: 0 %
Eosinophils Absolute: 0 K/uL (ref 0.0–0.5)
Eosinophils Relative: 1 %
HCT: 29.9 % — ABNORMAL LOW (ref 36.0–46.0)
Hemoglobin: 9.4 g/dL — ABNORMAL LOW (ref 12.0–15.0)
Immature Granulocytes: 0 %
Lymphocytes Relative: 14 %
Lymphs Abs: 0.9 K/uL (ref 0.7–4.0)
MCH: 28.9 pg (ref 26.0–34.0)
MCHC: 31.4 g/dL (ref 30.0–36.0)
MCV: 92 fL (ref 80.0–100.0)
Monocytes Absolute: 0.4 K/uL (ref 0.1–1.0)
Monocytes Relative: 8 %
Neutro Abs: 4.5 K/uL (ref 1.7–7.7)
Neutrophils Relative %: 77 %
Platelets: 372 K/uL (ref 150–400)
RBC: 3.25 MIL/uL — ABNORMAL LOW (ref 3.87–5.11)
RDW: 14.7 % (ref 11.5–15.5)
WBC: 5.9 K/uL (ref 4.0–10.5)
nRBC: 0 % (ref 0.0–0.2)

## 2024-11-14 LAB — COMPREHENSIVE METABOLIC PANEL WITH GFR
ALT: 13 U/L (ref 0–44)
AST: 35 U/L (ref 15–41)
Albumin: 3.4 g/dL — ABNORMAL LOW (ref 3.5–5.0)
Alkaline Phosphatase: 73 U/L (ref 38–126)
Anion gap: 17 — ABNORMAL HIGH (ref 5–15)
BUN: 28 mg/dL — ABNORMAL HIGH (ref 8–23)
CO2: 22 mmol/L (ref 22–32)
Calcium: 8.9 mg/dL (ref 8.9–10.3)
Chloride: 100 mmol/L (ref 98–111)
Creatinine, Ser: 1.21 mg/dL — ABNORMAL HIGH (ref 0.44–1.00)
GFR, Estimated: 41 mL/min — ABNORMAL LOW
Glucose, Bld: 73 mg/dL (ref 70–99)
Potassium: 4.1 mmol/L (ref 3.5–5.1)
Sodium: 138 mmol/L (ref 135–145)
Total Bilirubin: 0.5 mg/dL (ref 0.0–1.2)
Total Protein: 6.3 g/dL — ABNORMAL LOW (ref 6.5–8.1)

## 2024-11-14 LAB — PRO BRAIN NATRIURETIC PEPTIDE: Pro Brain Natriuretic Peptide: 12364 pg/mL — ABNORMAL HIGH

## 2024-11-14 LAB — TROPONIN T, HIGH SENSITIVITY: Troponin T High Sensitivity: 95 ng/L — ABNORMAL HIGH (ref 0–19)

## 2024-11-14 LAB — ETHANOL: Alcohol, Ethyl (B): <15 mg/dL

## 2024-11-14 MED ORDER — LABETALOL HCL 5 MG/ML IV SOLN: 10.0000 mg | Freq: Once | INTRAVENOUS | Status: DC

## 2024-11-14 MED ORDER — QUETIAPINE FUMARATE 25 MG PO TABS
12.5000 mg | ORAL_TABLET | Freq: Once | ORAL | Status: AC
  Administered 2024-11-23: 12.5 mg via ORAL

## 2024-11-14 MED ORDER — ZIPRASIDONE MESYLATE 20 MG IM SOLR
10.0000 mg | Freq: Once | INTRAMUSCULAR | Status: AC
  Administered 2024-11-14: 10 mg via INTRAMUSCULAR
  Filled 2024-11-14: qty 20

## 2024-11-14 MED ORDER — STERILE WATER FOR INJECTION IJ SOLN
INTRAMUSCULAR | Status: AC
  Administered 2024-11-14: 1.2 mL
  Filled 2024-11-14: qty 10

## 2024-11-14 NOTE — ED Notes (Signed)
 Pt is upset that her family placed her into a facility.  Sts she will continue to not eat or drink until she gets to go back home.  Pt's niece is at bedside and reports her father and uncle are HCPOAs.

## 2024-11-14 NOTE — ED Notes (Signed)
 This clinical research associate and [place], RN attempted to provide patient with privacy curtains due to patient being in the hallway. Patient then began to sit up in the bed and rip up her MOST form and throwing the pieces onto the floor. Patient states that y'all are not going to sleep tonight because you are holding me against my will. Patient continued to rip up her most form and stated that she was not going to shut up. Privacy screens were removed and patient stated she wants staff to call the police. Patient then stated she wanted to run up and down the hallways naked and pee everywhere so that yall have a mess to clean up. Patient is currently screaming nobody's gonna sleep tonight and other phrases intermittently. Patient laying in bed, in front of nurses station. MD was made aware by RN. Will continue to monitor.

## 2024-11-14 NOTE — ED Provider Notes (Signed)
 " [name] EMERGENCY DEPARTMENT AT [facility] Provider Note   CSN: [id number] Arrival date & time: 11/14/24  1702     Patient presents with: Aggressive Behavior   [name] is a 89 y.o. female.  89 year old female, DNR, history of chronic heart failure with EF 40 to 45%, moderate aortic stenosis, hypertension, chronic kidney disease, interstitial lung disease, anemia presents with aggressive behavior at her rehab facility.  Admitted here January 4 through 12 from home and treated for acute on chronic heart failure and acute respiratory failure with hypoxia and new onset atrial fibrillation.  Anticoagulation deferred due to chronic anemia and age.  She had episodes of agitation and delirium during the hospital stay and she was transition to full comfort care on January 6 but then this was changed by her nephew and she is now DNR and a facility.  She has had agitation and aggressive behavior throughout her stay there but today caused some damage to equipment and was throwing things so was transferred to the ED.  She is accompanied by her supportive niece Renee at the bedside.     Prior to Admission medications  Medication Sig Start Date End Date Taking? Authorizing Provider  acetaminophen  (TYLENOL ) 500 MG tablet Take 500 mg by mouth every 6 (six) hours as needed for mild pain (pain score 1-3) or moderate pain (pain score 4-6).   Yes [provider]  albuterol  (VENTOLIN  HFA) 108 (90 Base) MCG/ACT inhaler Inhale 1-2 puffs into the lungs every 6 (six) hours as needed for wheezing or shortness of breath. Patient taking differently: Inhale 2 puffs into the lungs every 6 (six) hours as needed for wheezing or shortness of breath. 08/27/24  Yes Ruthell Lauraine FALCON, NP  amLODipine  (NORVASC ) 5 MG tablet Take 1 tablet (5 mg total) by mouth daily. 05/31/24 11/14/24 Yes Turner, Wilbert SAUNDERS, MD  cholecalciferol  (VITAMIN D ) 1000 units tablet Take 5,000 Units by mouth once a week.   Yes [provider]  levothyroxine  (SYNTHROID ) 50 MCG tablet Take 1 tablet (50 mcg total) by mouth daily at 6 (six) AM. 11/09/24  Yes Gonfa, Mignon DASEN, MD  metoprolol  succinate (TOPROL -XL) 25 MG 24 hr tablet Take 1 tablet (25 mg total) by mouth daily. 11/08/24  Yes Gonfa, Taye T, MD  Multiple Vitamin (MULTIVITAMIN WITH MINERALS) TABS tablet Take 1 tablet by mouth daily.   Yes [provider]  polyethylene glycol powder (MIRALAX ) 17 GM/SCOOP powder Take 17 g by mouth 2 (two) times daily as needed for moderate constipation. 11/08/24  Yes Gonfa, Taye T, MD  QUEtiapine  (SEROQUEL ) 25 MG tablet Take 12.5 mg by mouth at bedtime.   Yes [provider]  senna-docusate (SENOKOT-S) 8.6-50 MG tablet Take 1 tablet by mouth 2 (two) times daily between meals as needed for mild constipation. 11/08/24  Yes Gonfa, Taye T, MD  simvastatin  (ZOCOR ) 20 MG tablet Take 20 mg by mouth at bedtime.   Yes [provider]  UNABLE TO FIND Apply 0.5 mg topically 2 (two) times daily as needed (for anxiety/agitation). Med Name: Lorazepam  1mg /ml gel   Yes [provider]    Allergies: Codeine    Review of Systems Positive for agitation Updated Vital Signs BP 124/65   Pulse 95   Temp 98.4 F (36.9 C)   Resp (!) 24   Ht 5' 1 (1.549 m)   Wt 47.2 kg   SpO2 95%   BMI 19.65 kg/m   Physical Exam  Constitutional: Thin  female on stretcher, agitated but conversant.  HENT:  Head: Normocephalic and atraumatic.  Mouth/Throat: Oropharynx is clear and moist. No oropharyngeal exudate.  Eyes: Conjunctivae are normal. Pupils are equal, round, and reactive to light. Neck: Normal range of motion. Neck supple.  Cardiovascular: Normal rate, regular rhythm, normal heart sounds and intact distal pulses.   Pulmonary/Chest: Effort normal and breath sounds normal. No respiratory distress. No wheezes or rales.  Abdominal: Soft. Bowel sounds are normal. No distension or tenderness.  Musculoskeletal: Normal range of  motion. + 3 pitting edema.  Neurological: Patient is alert and oriented to person only.  No facial droop.  Clear speech.  Normal strength and sensation throughout. Skin: Skin is warm and dry. No diaphoresis.  Psychiatric: Very agitated, anxious, flight of ideas, paranoid. Nursing note and vitals reviewed.  (all labs ordered are listed, but only abnormal results are displayed) Labs Reviewed  CBC WITH DIFFERENTIAL/PLATELET - Abnormal; Notable for the following components:      Result Value   RBC 3.25 (*)    Hemoglobin 9.4 (*)    HCT 29.9 (*)    All other components within normal limits  TSH  COMPREHENSIVE METABOLIC PANEL WITH GFR  PRO BRAIN NATRIURETIC PEPTIDE  ETHANOL  URINALYSIS, W/ REFLEX TO CULTURE (INFECTION SUSPECTED)  TROPONIN T, HIGH SENSITIVITY  TROPONIN T, HIGH SENSITIVITY    EKG: None  Radiology: DG Chest 1 View Result Date: 11/14/2024 CLINICAL DATA:  Congestive heart failure. EXAM: CHEST  1 VIEW COMPARISON:  10/31/2024. FINDINGS: The heart size and mediastinal contours are within normal limits. There is atherosclerotic calcification of the aorta. Persistent airspace disease is noted at the left lung base. There are likely small bilateral pleural effusions. No pneumothorax is seen. Shoulder arthroplasty changes are noted on the right. There is evidence of prior rotator cuff surgery on the left. IMPRESSION: 1. Stable left basilar airspace disease, possible atelectasis or infiltrate. 2. Small bilateral pleural effusions. Electronically Signed   By: Leita Birmingham M.D.   On: 11/14/2024 18:42    Procedures   Medications Ordered in the ED  QUEtiapine  (SEROQUEL ) tablet 12.5 mg (has no administration in time range)  labetalol  (NORMODYNE ) injection 10 mg (has no administration in time range)  ziprasidone  (GEODON ) injection 10 mg (10 mg Intramuscular Given 11/14/24 2155)  sterile water  (preservative free) injection (1.2 mLs  Given 11/14/24 2156)      This patient presents to the  ED with chief complaint(s) of agitation and combative behavior with pertinent past medical history of DNR, history of chronic heart failure with EF 40 to 45%, moderate aortic stenosis, hypertension, chronic kidney disease, interstitial lung disease, anemia  . The complaint involves an extensive differential diagnosis and also carries with it a high risk of complications and morbidity.    Additional history obtained from family. I have also reviewed previous admission documents  The differential diagnosis includes delirium versus dementia  The initial management included labs, EKG, chest CT  8pm Patient is refusing all labs and imaging and medication.  She is agitated and fighting with the staff.  Lengthy discussion with her great niece Charlies who is at the bedside as well as her nephew Lyndy and his sister who are her powers of attorney.  They are all in agreement at this point to pursue comfort care only.  They would not like her to have labs and imaging studies or any treatments against her well.  They would like her to be placed in hospice facility or have home  hospice in their area in [place] .  For now I will hold off on diagnostic studies as patient's vital signs are reassuring and her exam is grossly normal neurologically.  Patient is screaming and very agitated, fighting with staff and attempting to leave.  I have completed IVC paperwork and given her Geodon  for her safety as she is not safe to leave the emergency department on her own in the state of confusion and agitation and I would fear for her safety.    Consultation: - Consulted or discussed management/test interpretation with external professional: Placed order for TOC to review case and establish disposition.  Consideration for admission or further workup: No admission indicated  Social Determinants of health: None  Final diagnoses:  Aggressive behavior    ED Discharge Orders     None          Fredia Rosette Kirsch, MD 11/14/24 2249  "

## 2024-11-14 NOTE — ED Triage Notes (Signed)
 PT arrives via EMS from [facility]. Staff report aggressive behavior beginning yesterday. Pt was refusing care, medications, and threatening to harm staff. Pt attempted to bite EMS.

## 2024-11-14 NOTE — ED Notes (Signed)
 Patient assisted to the restroom per request.Two person assist, patient upset and yelling at staff entire time about how she wants no help and us  not to touch her but we better not leave her in the bathroom Ensured patient staff would not leave her in the restroom alone, this nurse and NT stayed with patient and assisted back to bed when patient stated she was done.

## 2024-11-14 NOTE — ED Notes (Signed)
 PT refused EKG and blood work, pt stated I don't want it, I know my rights. I want to go home.

## 2024-11-15 LAB — TSH: TSH: 12.7 u[IU]/mL — ABNORMAL HIGH (ref 0.350–4.500)

## 2024-11-15 LAB — TROPONIN T, HIGH SENSITIVITY: Troponin T High Sensitivity: 95 ng/L — ABNORMAL HIGH (ref 0–19)

## 2024-11-15 NOTE — Progress Notes (Signed)
 [facility] Liaison Note            [facility] received a hospice referral for Mrs. Flood through [employer] work queue.   We will be happy to provide hospice services for her when a discharge disposition has been determined.    Please reach out if there are questions or concerns.   Greig Basket BSN, RN [facility] Liaison  [phone]

## 2024-11-15 NOTE — Progress Notes (Addendum)
 CSW received communication via secure chat from [facility] that pt's caregiver reported pt cannot go home due to living alone.   CSW attempted to contact E[place] at [facility] to inquire if she is willing to accept pt back after medications are reviewed/adjusted. Awaiting response.   CSW spoke with Daphne who reported her father (pt's brother) was POA until his passing 11 months ago. She reported no one else (to her knowledge) is POA. CSW mentioned there is typically a secondary and Brandi reported she is unaware. [name] stated Rock Cogan [phone]) is Medical POA and Alm Shams [phone]) is POA. Per [name] and [place], neither of them answer the phone or respond to calls.   Daphne stated she and [name] had to return to [place]  and they hoped that pt would be in rehab for 100 days. CSW explained pt would not be in rehab for 100 days. CSW explained ideally pt would need to return home with hospice and private duty caregivers if she has the funds available.   CSW attempted to file an APS report; however, the call is disconnected after noting DSS is closed in observance for the holiday. ICM will attempt to contact APS tomorrow to file a report. APS SW Leonor Ply will call this writer back to file a report.   Brandi also provided [employer] [phone]) as contact.   Addend @ 2:31PM APS report filed. ICM will receive screening decision tomorrow via phone call.

## 2024-11-15 NOTE — ED Provider Notes (Signed)
 Emergency Medicine Observation Re-evaluation Note  [name] is a 89 y.o. female, seen on rounds today.  Pt initially presented to the ED for complaints of Aggressive Behavior Currently, the patient is sleeping.  Physical Exam  BP (!) 169/67   Pulse 67   Temp 97.9 F (36.6 C) (Axillary)   Resp 17   Ht 5' 1 (1.549 m)   Wt 47.2 kg   SpO2 96%   BMI 19.65 kg/m  Physical Exam General: nad Cardiac: regular rate Lungs: clear Psych: currently sleeping  ED Course / MDM  EKG:   I have reviewed the labs performed to date as well as medications administered while in observation.  Recent changes in the last 24 hours include none.  Plan  Current plan is for hospice and placement.    Doretha Folks, MD 11/15/24 [phone]

## 2024-11-15 NOTE — Care Management (Addendum)
 RNCM spoke to [name]) regarding patient's discharge plan. Rob stated that pt. Plans to return to [facility] at [address] [place], [place] [address] with Hospice services. RNCM contacted [facility] since family does not have a preference. RNCM had to leave messages for return calls.  RNCM spoke to Rob due to hospice decision with Rehab to explain that Rehab an Hospice can not be combined. Rob requested that RNCM speak with his sister Daphne @ [phone] to discuss d/c plan. RNCM attempted call, no answer LVM with contact information for return call.

## 2024-11-15 NOTE — Progress Notes (Signed)
 Per chart review, PA discussed comfort care/hospice with patients family last evening. CSW spoke with [name] who confirmed they are interested. He stated he is unsure if the hospice will take place here or in [place] . CSW informed RNCM will reach out to assist. [name] verbalized understanding and thanked this clinical research associate.   RNCM notified via secure chat.

## 2024-11-15 NOTE — Care Plan (Addendum)
 RNCM received  consult for hospice care.RNCM  contacted [facility] and received update from [facility] if patient discharge disposition is home [facility] will provide  home Hospice services. At this time discharge disposition remains unclear. Pt. can not return to [facility] [place] [facility] due to combative behavior per [facility]. Will continue to follow.

## 2024-11-15 NOTE — ED Notes (Signed)
"  Patient provided with breakfast tray   "

## 2024-11-15 NOTE — TOC CM/SW Note (Signed)
 Received call from Corean Matt, patient's caregiver [phone]). CM reported that patient is still is the ED at [facility] and there is no current d/c order in place.   Merilee Batty, MSN, RN Case Management [phone]

## 2024-11-16 MED ORDER — METOPROLOL SUCCINATE ER 50 MG PO TB24
25.0000 mg | ORAL_TABLET | Freq: Every day | ORAL | Status: DC
  Administered 2024-11-16 – 2024-11-21 (×5): 25 mg via ORAL
  Filled 2024-11-16 (×9): qty 1

## 2024-11-16 MED ORDER — ACETAMINOPHEN 325 MG PO TABS
650.0000 mg | ORAL_TABLET | Freq: Once | ORAL | Status: AC
  Administered 2024-11-16: 650 mg via ORAL
  Filled 2024-11-16: qty 2

## 2024-11-16 MED ORDER — POLYETHYLENE GLYCOL 3350 17 G PO PACK
17.0000 g | PACK | Freq: Every day | ORAL | Status: AC
  Administered 2024-11-16 – 2024-11-18 (×3): 17 g via ORAL
  Filled 2024-11-16 (×5): qty 1

## 2024-11-16 MED ORDER — AMLODIPINE BESYLATE 5 MG PO TABS
5.0000 mg | ORAL_TABLET | Freq: Every day | ORAL | Status: DC
  Administered 2024-11-16 – 2024-11-21 (×5): 5 mg via ORAL
  Filled 2024-11-16 (×9): qty 1

## 2024-11-16 MED ORDER — LEVOTHYROXINE SODIUM 50 MCG PO TABS
50.0000 ug | ORAL_TABLET | Freq: Every day | ORAL | Status: DC
  Administered 2024-11-16 – 2024-11-18 (×2): 50 ug via ORAL
  Filled 2024-11-16 (×7): qty 1

## 2024-11-16 MED ORDER — ACETAMINOPHEN 500 MG PO TABS
1000.0000 mg | ORAL_TABLET | Freq: Once | ORAL | Status: AC
  Administered 2024-11-16: 1000 mg via ORAL
  Filled 2024-11-16: qty 2

## 2024-11-16 MED ORDER — SIMVASTATIN 20 MG PO TABS
20.0000 mg | ORAL_TABLET | Freq: Every day | ORAL | Status: DC
  Administered 2024-11-19: 20 mg via ORAL
  Filled 2024-11-16: qty 1

## 2024-11-16 MED ORDER — PROCHLORPERAZINE MALEATE 10 MG PO TABS
5.0000 mg | ORAL_TABLET | Freq: Once | ORAL | Status: AC
  Administered 2024-11-16: 5 mg via ORAL
  Filled 2024-11-16: qty 1

## 2024-11-16 MED ORDER — ALBUTEROL SULFATE HFA 108 (90 BASE) MCG/ACT IN AERS: 1.0000 | INHALATION_SPRAY | Freq: Four times a day (QID) | RESPIRATORY_TRACT | Status: DC | PRN

## 2024-11-16 MED ORDER — SENNOSIDES-DOCUSATE SODIUM 8.6-50 MG PO TABS: 1.0000 | ORAL_TABLET | Freq: Two times a day (BID) | ORAL | Status: DC | PRN

## 2024-11-16 MED ORDER — DIPHENHYDRAMINE HCL 12.5 MG/5ML PO ELIX
12.5000 mg | ORAL_SOLUTION | Freq: Once | ORAL | Status: AC
  Administered 2024-11-16: 12.5 mg via ORAL
  Filled 2024-11-16: qty 5

## 2024-11-16 NOTE — ED Provider Notes (Signed)
 Emergency Medicine Observation Re-evaluation Note  [name] is a 89 y.o. female, seen on rounds today.  Pt initially presented to the ED for complaints of Aggressive Behavior Currently, the patient is resting.  Physical Exam  BP 138/61 (BP Location: Left Arm)   Pulse 71   Temp 98.5 F (36.9 C) (Oral)   Resp 16   Ht 5' 1 (1.549 m)   Wt 47.2 kg   SpO2 94%   BMI 19.65 kg/m  Physical Exam General: Calm Cardiac: Well perfused Lungs: Even respirations Psych: Calm  ED Course / MDM  EKG:EKG Interpretation Date/Time:  Sunday November 14 2024 22:22:12 EST Ventricular Rate:  86 PR Interval:  198 QRS Duration:  106 QT Interval:  421 QTC Calculation: 504 R Axis:   -55  Text Interpretation: Sinus rhythm Atrial premature complexes LAD, consider left anterior fascicular block Left ventricular hypertrophy Anterior infarct, old Prolonged QT interval Confirmed by Midge Golas ([id number]) on 11/15/2024 2:36:10 PM  I have reviewed the labs performed to date as well as medications administered while in observation.  Recent changes in the last 24 hours include APS report filed and Hospice care being considered at home. PT evaluation ordered.   Plan  Current plan is for placement.    Darra Fonda MATSU, MD 11/16/24 [phone]

## 2024-11-16 NOTE — Progress Notes (Addendum)
 Received call from Yolanda with APS that case is screened in and a SW has been assigned and will investigate.  Addend @ 2:22PM Will follow for PT eval and fax out for possible bed offers while APS works to access finances. Pt eligible to be faxed out due to having a 3 Midnight Inpatient stay within 30 days.   Addend @ 6:43PM CSW sent email to Etha Chang, APS SW informing need for pt's funds to be accessed to determine if she can afford to pay for 24 hour care at home or SNF (LTC).  CSW also informed pt is unable to receive hospice and STR simultaneously. ICM following.

## 2024-11-17 NOTE — ED Notes (Signed)
 Patient has been alert this shift.  Patient has refused medication this shift.   Patient has refused meals.   Patient has had fluids

## 2024-11-17 NOTE — Progress Notes (Signed)
 PT Cancellation Note  Patient Details Name: [name] MRN: [id number] DOB: 15-Nov-1926   Cancelled Treatment:    Reason Eval/Treat Not Completed: Medical issues which prohibited therapy. Pt in bed, very confused and unable to orient patient to time and situation as she repeatedly stated I am going to bed, it's 10pm, not time to walk. Pt became upset and agitated with attempts to reorient and disctraction, begins to perseverate on missing watch. PT will attempt eval at later time.  Isaiah DEL. Syrina Wake, PT, DPT   [employer] 11/17/2024, 1:03 PM

## 2024-11-17 NOTE — ED Notes (Signed)
 Pt. pull off her diaper and socks and refuse let me put another on her.

## 2024-11-17 NOTE — ED Notes (Addendum)
 Pt. told writer get out her room and the police on the way get out!! Pt. Continues to be combative.

## 2024-11-17 NOTE — Progress Notes (Signed)
 CSW spoke with [name] who stated she will give this writer a call back in a couple hours once she has more information.

## 2024-11-17 NOTE — ED Provider Notes (Signed)
 Emergency Medicine Observation Re-evaluation Note  [name] is a 89 y.o. female, seen on rounds today.  Pt initially presented to the ED for complaints of Aggressive Behavior Currently, the patient is aggressive behavior.  Physical Exam  BP (!) 172/98 (BP Location: Right Arm)   Pulse (!) 130   Temp 97.8 F (36.6 C)   Resp 20   Ht 1.549 m (5' 1)   Wt 47.2 kg   SpO2 96%   BMI 19.65 kg/m  Physical Exam General: Cachectic Cardiac: Normal heart rate on my evaluation a low heart rate of 130 is noted and nursing notes Lungs: No respiratory distress normal oxygen saturation Psych: Patient is awake and on the commode.  Responds appropriately to me.  She denies any complaints.  ED Course / MDM  EKG:EKG Interpretation Date/Time:  Sunday November 14 2024 22:22:12 EST Ventricular Rate:  86 PR Interval:  198 QRS Duration:  106 QT Interval:  421 QTC Calculation: 504 R Axis:   -55  Text Interpretation: Sinus rhythm Atrial premature complexes LAD, consider left anterior fascicular block Left ventricular hypertrophy Anterior infarct, old Prolonged QT interval Confirmed by Midge Golas ([id number]) on 11/15/2024 2:36:10 PM  I have reviewed the labs performed to date as well as medications administered while in observation.  Recent changes in the last 24 hours include none noted.  Plan  Current plan is for placement.    Levander Houston, MD 11/17/24 [phone]

## 2024-11-17 NOTE — ED Notes (Signed)
Pt. Refuse vital signs

## 2024-11-17 NOTE — ED Notes (Addendum)
 Pt refuse her vital signs and her care to be check to change if she needs it.

## 2024-11-17 NOTE — ED Notes (Signed)
 Mrs. Burbano remains angry and continues to refuse to allow staff to assist her with AM care. Continues to attempt to get oob by putting her legs over the bed rails. States I am going home today, my home so call the police and an ambulance to transport me home. Mrs Huckaba was reminded that she is under IVC and can not leave

## 2024-11-18 ENCOUNTER — Emergency Department (HOSPITAL_COMMUNITY)

## 2024-11-18 MED ORDER — FUROSEMIDE 40 MG PO TABS
40.0000 mg | ORAL_TABLET | Freq: Once | ORAL | Status: DC
  Filled 2024-11-18 (×2): qty 1

## 2024-11-18 MED ORDER — FUROSEMIDE 10 MG/ML IJ SOLN
40.0000 mg | Freq: Once | INTRAMUSCULAR | Status: DC
  Filled 2024-11-18: qty 4

## 2024-11-18 MED ORDER — ACETAMINOPHEN 500 MG PO TABS
ORAL_TABLET | ORAL | Status: AC
  Filled 2024-11-18: qty 1

## 2024-11-18 NOTE — ED Notes (Addendum)
 Dr Mannie at bedside to assess pt order for portable CXR obtained. Pt HOB elevated to 40 degress and 3L oxygen via N/C in place and Mrs Saur verbalized breathing has improved. Room air saturation 100%. Sitter remains at bedside comfort care supported vital signs show mild to moderate HTN but HR and O2 sat are WNL.

## 2024-11-18 NOTE — Evaluation (Signed)
 Physical Therapy Evaluation Patient Details Name: [name] MRN: [id number] DOB: 10-Mar-1927 Today's Date: 11/18/2024  History of Present Illness  [name] is a 89 y.o. female brought to the ER 11/15/23 from SNF with aggressive behavior. Recenttly at [facility] for CHF, new onset A fib , periods of agitation and delirium. DC to SNF.PMH: chronic HFpEF, moderate aortic stenosis.   hypertension, chronic kidney disease stage III, anemia  Clinical Impression  Pt admitted with above diagnosis.  Pt currently with functional limitations due to the deficits listed below (see PT Problem List). Pt will benefit from acute skilled PT to increase their independence and safety with mobility to allow discharge.       The patient is alert and oriented to self and hospital, month/ear. Patient does not recall being in a rehab facility. Patient able to participate in  mobility, mod support for  stand and pivot to  and from [facility].   Patient will benefit from continued inpatient follow up therapy, <3 hours/day     If plan is discharge home, recommend the following: A little help with walking and/or transfers;A lot of help with bathing/dressing/bathroom   Can travel by private vehicle   No    Equipment Recommendations None recommended by PT  Recommendations for Other Services       Functional Status Assessment Patient has had a recent decline in their functional status and/or demonstrates limited ability to make significant improvements in function in a reasonable and predictable amount of time     Precautions / Restrictions Precautions Precautions: Fall      Mobility  Bed Mobility   Bed Mobility: Rolling, Sidelying to Sit, Sit to Sidelying Rolling: Contact guard assist Sidelying to sit: Mod assist   Sit to supine: Mod assist   General bed mobility comments: assist trunk and legs    Transfers Overall transfer level: Needs assistance   Transfers: Sit to/from Stand, Bed to  chair/wheelchair/BSC Sit to Stand: Mod assist   Step pivot transfers: Mod assist       General transfer comment: steady assist to stand from bed and step to and from [facility] with mod support    Ambulation/Gait                  Stairs            Wheelchair Mobility     Tilt Bed    Modified Rankin (Stroke Patients Only)       Balance Overall balance assessment: Needs assistance, History of Falls   Sitting balance-Leahy Scale: Fair     Standing balance support: During functional activity, Bilateral upper extremity supported, Reliant on assistive device for balance Standing balance-Leahy Scale: Poor                               Pertinent Vitals/Pain Pain Assessment Pain Assessment: Faces Faces Pain Scale: Hurts even more Pain Location: back Pain Descriptors / Indicators: Aching Pain Intervention(s): Monitored during session, Repositioned    Home Living Family/patient expects to be discharged to:: Skilled nursing facility                        Prior Function Prior Level of Function : Patient poor historian/Family not available             Mobility Comments: unsure if ambulatory at [facility]       Extremity/Trunk Assessment   Upper Extremity  Assessment Upper Extremity Assessment: Generalized weakness    Lower Extremity Assessment Lower Extremity Assessment: Generalized weakness    Cervical / Trunk Assessment Cervical / Trunk Assessment: Kyphotic  Communication   Communication Communication: Impaired Factors Affecting Communication: Hearing impaired    Cognition Arousal: Alert Behavior During Therapy: WFL for tasks assessed/performed, Anxious   PT - Cognitive impairments: Orientation                       PT - Cognition Comments: oriented to self, hospital, month and year, Does not recall that she has been in rehab Following commands: Impaired Following commands impaired: Follows one step commands with  increased time     Cueing Cueing Techniques: Verbal cues, Tactile cues     General Comments      Exercises     Assessment/Plan    PT Assessment Patient needs continued PT services  PT Problem List Decreased strength;Decreased mobility;Decreased safety awareness;Decreased activity tolerance;Decreased cognition;Decreased balance       PT Treatment Interventions      PT Goals (Current goals can be found in the Care Plan section)  Acute Rehab PT Goals Patient Stated Goal: I want to go home    Frequency Min 2X/week     Co-evaluation               AM-PAC PT 6 Clicks Mobility  Outcome Measure Help needed turning from your back to your side while in a flat bed without using bedrails?: A Little Help needed moving from lying on your back to sitting on the side of a flat bed without using bedrails?: A Lot Help needed moving to and from a bed to a chair (including a wheelchair)?: A Lot Help needed standing up from a chair using your arms (e.g., wheelchair or bedside chair)?: A Lot Help needed to walk in hospital room?: Total Help needed climbing 3-5 steps with a railing? : Total 6 Click Score: 11    End of Session Equipment Utilized During Treatment: Gait belt Activity Tolerance: Patient tolerated treatment well Patient left: in bed;with call bell/phone within reach;with bed alarm set   PT Visit Diagnosis: Unsteadiness on feet (R26.81);Muscle weakness (generalized) (M62.81);History of falling (Z91.81);Adult, failure to thrive (R62.7)    Time: 0835-0900 PT Time Calculation (min) (ACUTE ONLY): 25 min   Charges:   PT Evaluation $PT Eval Low Complexity: 1 Low PT Treatments $Therapeutic Activity: 8-22 mins PT General Charges $$ ACUTE PT VISIT: 1 Visit         [name] PT Acute Rehabilitation Services Office [phone]   [name] 11/18/2024, 9:14 AM

## 2024-11-18 NOTE — NC FL2 (Signed)
 " [place]  MEDICAID FL2 LEVEL OF CARE FORM     IDENTIFICATION  Patient Name: [name] Birthdate: 1927/03/22 Sex: female Admission Date (Current Location): 11/14/2024  [facility] and [place] Number:  Producer, Television/film/video and Address:  [facility],  [address] [place], [place] [address]      Provider Number: [id number]  Attending Physician Name and Address:  Dasie Faden, MD  Relative Name and Phone Number:  Marikay [name] [place], Emergency Contact  [phone]    Current Level of Care: Hospital Recommended Level of Care: Skilled Nursing Facility Prior Approval Number:    Date Approved/Denied:   PASRR Number: [id number]  Discharge Plan: SNF    Current Diagnoses: Patient Active Problem List   Diagnosis Date Noted   Acute hypoxic respiratory failure (HCC) 11/02/2024   Acute on chronic heart failure with preserved ejection fraction (HFpEF) (HCC) 11/01/2024   ILD (interstitial lung disease) (HCC) 11/01/2024   MCI (mild cognitive impairment) 11/01/2024   DNR (do not resuscitate) 11/01/2024   Hypothyroidism 11/01/2024   Dyslipidemia 11/01/2024   Essential hypertension 11/01/2024   Elevated d-dimer 11/01/2024   Acute CHF (congestive heart failure) (HCC) 10/31/2024   Chronic kidney disease, stage 3b (HCC) 10/31/2024   New onset atrial fibrillation (HCC) 10/31/2024   Elevated troponin 10/31/2024   Aortic stenosis    Impacted cerumen of both ears 02/03/2024   Normocytic normochromic anemia 09/18/2023   S/P shoulder replacement, 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    Orientation RESPIRATION BLADDER Height & Weight     Place, Self  Normal Incontinent Weight: 104 lb (47.2 kg) Height:  5' 1 (154.9 cm)  BEHAVIORAL SYMPTOMS/MOOD NEUROLOGICAL BOWEL NUTRITION STATUS      Incontinent Diet (Regular)  AMBULATORY STATUS COMMUNICATION OF NEEDS Skin   Limited Assist Verbally Normal                       Personal Care Assistance Level of Assistance  Bathing, Feeding,  Dressing Bathing Assistance: Limited assistance Feeding assistance: Independent Dressing Assistance: Limited assistance     Functional Limitations Info  Hearing, Sight, Speech Sight Info: Adequate Hearing Info: Adequate Speech Info: Adequate    SPECIAL CARE FACTORS FREQUENCY  PT (By licensed PT), OT (By licensed OT)     PT Frequency: x5/week OT Frequency: x5/week            Contractures Contractures Info: Not present    Additional Factors Info  Code Status, Allergies Code Status Info: Full Allergies Info: Codeine           Current Medications (11/18/2024):  This is the current hospital active medication list Current Facility-Administered Medications  Medication Dose Route Frequency Provider Last Rate Last Admin   albuterol  (VENTOLIN  HFA) 108 (90 Base) MCG/ACT inhaler 1-2 puff  1-2 puff Inhalation Q6H PRN Long, Joshua G, MD       amLODipine  (NORVASC ) tablet 5 mg  5 mg Oral Daily Long, Joshua G, MD   5 mg at 11/16/24 1234   labetalol  (NORMODYNE ) injection 10 mg  10 mg Intravenous Once Jonnal, Aparna Hima, MD       levothyroxine  (SYNTHROID ) tablet 50 mcg  50 mcg Oral Q0600 Long, Joshua G, MD   50 mcg at 11/18/24 0827   metoprolol  succinate (TOPROL -XL) 24 hr tablet 25 mg  25 mg Oral Daily Long, Joshua G, MD   25 mg at 11/16/24 1233   polyethylene glycol (MIRALAX  / GLYCOLAX ) packet 17 g  17 g Oral Daily Long, Joshua G,  MD   17 g at 11/17/24 1022   QUEtiapine  (SEROQUEL ) tablet 12.5 mg  12.5 mg Oral Once Jonnal, Aparna Hima, MD       senna-docusate (Senokot-S) tablet 1 tablet  1 tablet Oral BID BM PRN Long, Joshua G, MD       simvastatin  (ZOCOR ) tablet 20 mg  20 mg Oral q1800 Long, Joshua G, MD       Current Outpatient Medications  Medication Sig Dispense Refill   acetaminophen  (TYLENOL ) 500 MG tablet Take 500 mg by mouth every 6 (six) hours as needed for mild pain (pain score 1-3) or moderate pain (pain score 4-6).     albuterol  (VENTOLIN  HFA) 108 (90 Base) MCG/ACT inhaler  Inhale 1-2 puffs into the lungs every 6 (six) hours as needed for wheezing or shortness of breath. (Patient taking differently: Inhale 2 puffs into the lungs every 6 (six) hours as needed for wheezing or shortness of breath.) 8 g 2   amLODipine  (NORVASC ) 5 MG tablet Take 1 tablet (5 mg total) by mouth daily. 180 tablet 3   cholecalciferol  (VITAMIN D ) 1000 units tablet Take 5,000 Units by mouth once a week.     levothyroxine  (SYNTHROID ) 50 MCG tablet Take 1 tablet (50 mcg total) by mouth daily at 6 (six) AM.     metoprolol  succinate (TOPROL -XL) 25 MG 24 hr tablet Take 1 tablet (25 mg total) by mouth daily.     Multiple Vitamin (MULTIVITAMIN WITH MINERALS) TABS tablet Take 1 tablet by mouth daily.     polyethylene glycol powder (MIRALAX ) 17 GM/SCOOP powder Take 17 g by mouth 2 (two) times daily as needed for moderate constipation.     QUEtiapine  (SEROQUEL ) 25 MG tablet Take 12.5 mg by mouth at bedtime.     senna-docusate (SENOKOT-S) 8.6-50 MG tablet Take 1 tablet by mouth 2 (two) times daily between meals as needed for mild constipation.     simvastatin  (ZOCOR ) 20 MG tablet Take 20 mg by mouth at bedtime.     UNABLE TO FIND Apply 0.5 mg topically 2 (two) times daily as needed (for anxiety/agitation). Med Name: Lorazepam  1mg /ml gel       Discharge Medications: Please see discharge summary for a list of discharge medications.  Relevant Imaging Results:  Relevant Lab Results:   Additional Information SSN:[id number]  Sheri ONEIDA Sharps, LCSW     "

## 2024-11-18 NOTE — Progress Notes (Signed)
 SNF ref faxed; awaiting bed offers.

## 2024-11-18 NOTE — ED Notes (Signed)
 Patient resting in room. No aggressive behavior today. Feeding self.

## 2024-11-18 NOTE — Consult Note (Incomplete)
 Initial Consultation Note   Patient: [name] FMW:[id number] DOB: 13-May-1927 PCP: Okey Carlin Redbird, MD DOA: 11/14/2024 DOS: the patient was seen and examined on 11/18/2024 Primary service: Mannie Fairy DASEN, DO  Referring physician: Mannie Fairy DASEN, DO Reason for consult: Hypoxia  Assessment/Plan: Assessment and Plan:  # Acute hypoxic respiratory failure, resolved # Chronic diastolic HF # Pulmonary edema - Initially placed on 3 L [place] due to difficulty breathing. CXR early showed mild pulmonary edema. On my evaluation, patient back on room air with appropriate and in no respiratory distress. - Supplemental O2 as needed, can be discharged with this for comfort - Patient refused oral Lasix , continue if willing to take  # Acute metabolic encephalopathy - Patient initially presented to the ED 4 days ago due to aggressive behavior at SNF. On chart review, patient did have episodes of agitation and delirium during her recent hospitalization earlier this month. - Likely dementia with some delirium - Continue IVC  # Goals of care - On the initial ED visit on 1/18 there was an extensive discussion by EDP with patient's family concerning her goals of care. The decision was made to pursue comfort care only with plan to place patient in a hospice facility or home hospice in their area in [place] . CSW assisting with possible inpatient hospice placement - Continue further discussions with family concerning inpatient hospice placement versus home hospice in [place]  - Continue comfort care  TRH will sign off at present, please call us  again when needed.  HPI: [name] is a 89 y.o. female with past medical history of chronic diastolic HF, moderate aortic stenosis, CKD 3B, GERD, HLD, HTN, osteoarthritis, paroxysmal A-fib, interstitial lung disease and GERD who has had a recent decline in her health. She initially presented to the ED on 1/18 from her SNF due to aggressive  behavior. Patient has been boarded in the psych unit while pending disposition to SNF versus residential hospice. Staff reported patient has some difficulty with her breathing today, was placed on 3 L [place] and an x-ray was obtained that showed mild pulmonary edema. EDP evaluated patient and ordered oral Lasix  40 mg x 1. TRH was consulted for admission for acute hypoxic respiratory failure. On my evaluation, patient was laying comfortably in bed on room air. She denies any shortness of breath or pain. States she does not want to take any pills and would like to die naturally. States she is the one in charge and will be the one making a decision to take any pills if she does go to [place] .  Patient allowed me to listen to her lungs once then refused to allow me to examine her any further.  I was unable to reach patient's nephew but I had an extensive conversation with her niece, [name] who is currently in [place]  over the phone. She was quite emotional and surprised by patient's acute decline, reporting that patient was doing just fine about 3 weeks ago. She expressed to me that patient wants to go home however she understand that it will be unsafe for patient to go home as she will not be able to take care of herself. They have been thinking about taking patient to [place]  but they are still surprised about the change in her behavior. She would like patient's nephew and caretaker to help make a decision about patient's disposition. I advised her to have further discussions with her brother and patient's caretaker about patient's disposition tomorrow.  Review of Systems: As  mentioned in the history of present illness. All other systems reviewed and are negative. Past Medical History:  Diagnosis Date   Aortic stenosis    Moderate by echo 05/2024   Arthritis    Chronic kidney disease, stage 3b (HCC)    GERD (gastroesophageal reflux disease)    occ tums   Heart murmur    High  cholesterol    Hyperlipemia    Hypertension    Osteoarthritis    Sleep disturbance    Vitamin D  deficiency    Past Surgical History:  Procedure Laterality Date   BACK SURGERY     CARPAL TUNNEL RELEASE Bilateral    HAND SURGERY Left    laceration artery,?tendon   HIP SURGERY Bilateral 04/2005   replacement   REVERSE SHOULDER ARTHROPLASTY Right 09/27/2016   Procedure: RIGHT REVERSE SHOULDER ARTHROPLASTY;  Surgeon: Marcey Her, MD;  Location: [facility] OR;  Service: Orthopedics;  Laterality: Right;   SHOULDER SURGERY Bilateral    rotator cuff repairs  several   Social History:  reports that she has never smoked. She has been exposed to tobacco smoke. She has never used smokeless tobacco. She reports that she does not drink alcohol  and does not use drugs.  Allergies[1]  History reviewed. No pertinent family history.  Prior to Admission medications  Medication Sig Start Date End Date Taking? Authorizing Provider  acetaminophen  (TYLENOL ) 500 MG tablet Take 500 mg by mouth every 6 (six) hours as needed for mild pain (pain score 1-3) or moderate pain (pain score 4-6).   Yes [provider]  albuterol  (VENTOLIN  HFA) 108 (90 Base) MCG/ACT inhaler Inhale 1-2 puffs into the lungs every 6 (six) hours as needed for wheezing or shortness of breath. Patient taking differently: Inhale 2 puffs into the lungs every 6 (six) hours as needed for wheezing or shortness of breath. 08/27/24  Yes Ruthell Lauraine FALCON, NP  amLODipine  (NORVASC ) 5 MG tablet Take 1 tablet (5 mg total) by mouth daily. 05/31/24 11/14/24 Yes Turner, Wilbert SAUNDERS, MD  cholecalciferol  (VITAMIN D ) 1000 units tablet Take 5,000 Units by mouth once a week.   Yes [provider]  levothyroxine  (SYNTHROID ) 50 MCG tablet Take 1 tablet (50 mcg total) by mouth daily at 6 (six) AM. 11/09/24  Yes Gonfa, Taye T, MD  metoprolol  succinate (TOPROL -XL) 25 MG 24 hr tablet Take 1 tablet (25 mg total) by mouth daily. 11/08/24  Yes Gonfa, Taye T, MD   Multiple Vitamin (MULTIVITAMIN WITH MINERALS) TABS tablet Take 1 tablet by mouth daily.   Yes [provider]  polyethylene glycol powder (MIRALAX ) 17 GM/SCOOP powder Take 17 g by mouth 2 (two) times daily as needed for moderate constipation. 11/08/24  Yes Gonfa, Taye T, MD  QUEtiapine  (SEROQUEL ) 25 MG tablet Take 12.5 mg by mouth at bedtime.   Yes [provider]  senna-docusate (SENOKOT-S) 8.6-50 MG tablet Take 1 tablet by mouth 2 (two) times daily between meals as needed for mild constipation. 11/08/24  Yes Gonfa, Taye T, MD  simvastatin  (ZOCOR ) 20 MG tablet Take 20 mg by mouth at bedtime.   Yes [provider]  UNABLE TO FIND Apply 0.5 mg topically 2 (two) times daily as needed (for anxiety/agitation). Med Name: Lorazepam  1mg /ml gel   Yes [provider]    Physical Exam: Vitals:   11/18/24 1425 11/18/24 1850 11/18/24 1951 11/18/24 2000  BP: 116/73 118/72 (!) 164/76   Pulse: 72 71 88   Resp: 16 17 15    Temp: 97.6 F (  36.4 C) 98 F (36.7 C) 97.8 F (36.6 C)   TempSrc:  Oral    SpO2: 98% 99% 100% 100%  Weight:      Height:       General: Frail elderly woman laying in bed laying in bed. No acute distress. CV: Regular rate and rhythm. Pulmonary: Anterior lung sounds clear to auscultation. Did not allow me to continue examining her Skin: Warm and dry. No obvious rash or lesions. Neuro: Alert. Moves all extremities. Normal sensation to light touch. No focal deficit. Psych: Irritated mood  Data Reviewed:   Chest x-ray shows small left pleural effusion with left basilar airspace disease and mild pulmonary edema    Family Communication: Discussed plan with patient's niece over the phone Primary team communication: Dr. Fairy Gravely, ED provider Thank you very much for involving us  in the care of your patient.  I personally spent a total of 60 minutes in the care of the patient today including preparing to see the patient, getting/reviewing  separately obtained history, performing a medically appropriate exam/evaluation, referring and communicating with other health care professionals, documenting clinical information in the EHR, and long conversation with patient's niece over the phone and extensively reviewing chart since ER visit.  Author: Claretta CHRISTELLA Alderman, MD 11/18/2024 10:55 PM  For on call review [email or web address].      [1]  Allergies Allergen Reactions   Codeine Other (See Comments)    Insomnia

## 2024-11-18 NOTE — ED Provider Notes (Addendum)
 I was asked by nursing staff to come and evaluate the patient as she was reporting some difficulty with her breathing.  Patient placed on 3 L nasal cannula and stated she felt quite a lot better.  Obtained chest x-ray of the patient which showed increasing pulmonary edema.  She has a history of heart failure.  Patient currently in the process of trying to be placed for hospice.  At this time, patient is likely not being served well by remaining in our PCU area as she now is requiring more care than standard observation.  I called the patient's nephew, [name] Crimes at the number listed in the chart however there was no response.  I am going to provide patient with oral Lasix .  From a palliative standpoint, this will improve the patient's breathing and discomfort whilst not requiring us  to place an IV.   Mannie Fairy DASEN, DO 11/18/24 2206    Mannie Fairy T, DO 11/18/24 2207

## 2024-11-19 DIAGNOSIS — R4689 Other symptoms and signs involving appearance and behavior: Principal | ICD-10-CM

## 2024-11-19 DIAGNOSIS — J810 Acute pulmonary edema: Secondary | ICD-10-CM

## 2024-11-19 NOTE — ED Provider Notes (Signed)
 Emergency Medicine Observation Re-evaluation Note  [name] is a 89 y.o. female, seen on rounds today.  Pt initially presented to the ED for complaints of Aggressive Behavior Currently, the patient is resting.  Physical Exam  BP (!) 182/112 (BP Location: Right Arm)   Pulse (!) 128   Temp 98.7 F (37.1 C)   Resp 16   Ht 1.549 m (5' 1)   Wt 47.2 kg   SpO2 100%   BMI 19.65 kg/m  Physical Exam General: nad  ED Course / MDM  EKG:EKG Interpretation Date/Time:  Sunday November 14 2024 22:22:12 EST Ventricular Rate:  86 PR Interval:  198 QRS Duration:  106 QT Interval:  421 QTC Calculation: 504 R Axis:   -55  Text Interpretation: Sinus rhythm Atrial premature complexes LAD, consider left anterior fascicular block Left ventricular hypertrophy Anterior infarct, old Prolonged QT interval Confirmed by Midge Golas ([id number]) on 11/15/2024 2:36:10 PM  I have reviewed the labs performed to date as well as medications administered while in observation.  Recent changes in the last 24 hours include consultation by IM service.  Plan  Current plan is for hospice placement.      Randol Simmonds, MD 11/19/24 [phone]

## 2024-11-19 NOTE — ED Notes (Signed)
 Mrs. Riso is currently sleeping with no signs of discomfort or distress. Pt skin is P/W/D and respirations are easy.

## 2024-11-19 NOTE — ED Notes (Signed)
 Pt did not eat anything off her tray. Only observed her take one bite of the meatloaf initially. Also observed her take 2-3 sips of her lemonade.

## 2024-11-19 NOTE — ED Notes (Signed)
 Tray given and set up for pt. Pt insists that I throw food away because she will not eat it. Tray left in front of pt.

## 2024-11-19 NOTE — ED Notes (Signed)
 Patient resting in bed quietly. States that she is not hungry at this time.

## 2024-11-20 MED ORDER — CARMEX CLASSIC LIP BALM EX OINT
1.0000 | TOPICAL_OINTMENT | CUTANEOUS | Status: DC | PRN
  Administered 2024-11-20: 1 via TOPICAL
  Filled 2024-11-20: qty 10

## 2024-11-20 NOTE — ED Provider Notes (Signed)
" °  Physical Exam  BP 138/74 (BP Location: Right Arm)   Pulse 79   Temp 98.6 F (37 C)   Resp 18   Ht 5' 1 (1.549 m)   Wt 47.2 kg   SpO2 97%   BMI 19.65 kg/m   Physical Exam  Procedures  Procedures  ED Course / MDM    Medical Decision Making Amount and/or Complexity of Data Reviewed Labs: ordered. Radiology: ordered.  Risk OTC drugs. Prescription drug management.   Pending placement to reported hospice either in-house or at nursing home in [place] .       Patsey Lot, MD 11/20/24 [phone]  "

## 2024-11-20 NOTE — ED Notes (Signed)
 Patient has been alert this shift. Patient has been irritable but reassured and redirectable. Patient has been offered food. Patient ate some bites for lunch.   Patient medication compliant, in her V8 juice. Patient has been sleeping most of the shift.  Patient does need assist with ADLs.[name]

## 2024-11-20 NOTE — TOC Progression Note (Signed)
 Transition of Care [facility]) - Progression Note    Patient Details  Name: [name] MRN: [id number] Date of Birth: October 09, 1927  Transition of Care [place]) CM/SW Contact  Bridget Cordella Simmonds, LCSW Phone Number: 11/20/2024, 7:50 AM  Clinical Narrative:   Pt with no SNF bed offers.  Referral sent out to additional facilities.                      Expected Discharge Plan and Services                                               Social Drivers of Health (SDOH) Interventions SDOH Screenings   Food Insecurity: Patient Unable To Answer (10/31/2024)  Housing: Unknown (10/31/2024)  Transportation Needs: Patient Unable To Answer (10/31/2024)  Utilities: Patient Unable To Answer (10/31/2024)  Social Connections: Unknown (10/31/2024)  Tobacco Use: Low Risk (11/14/2024)    Readmission Risk Interventions     No data to display

## 2024-11-20 NOTE — ED Notes (Signed)
 Patient urinated in the [facility] with 1 assist.

## 2024-11-21 NOTE — ED Provider Notes (Signed)
 Emergency Medicine Observation Re-evaluation Note  [name] is a 89 y.o. female, seen on rounds today.  Pt initially presented to the ED for complaints of Aggressive Behavior Currently, the patient is resting.  Physical Exam  BP (!) 155/68 (BP Location: Right Arm)   Pulse 62   Temp 98.1 F (36.7 C) (Axillary)   Resp (!) 24   Ht 5' 1 (1.549 m)   Wt 47.2 kg   SpO2 96%   BMI 19.65 kg/m  Physical Exam General: NAD  ED Course / MDM  EKG:EKG Interpretation Date/Time:  Sunday November 14 2024 22:22:12 EST Ventricular Rate:  86 PR Interval:  198 QRS Duration:  106 QT Interval:  421 QTC Calculation: 504 R Axis:   -55  Text Interpretation: Sinus rhythm Atrial premature complexes LAD, consider left anterior fascicular block Left ventricular hypertrophy Anterior infarct, old Prolonged QT interval Confirmed by Midge Golas ([id number]) on 11/15/2024 2:36:10 PM  I have reviewed the labs performed to date as well as medications administered while in observation.  Recent changes in the last 24 hours include no acute .  Plan  Current plan is for placement.    Laurice Maude JAYSON, MD 11/21/24 [phone]

## 2024-11-21 NOTE — ED Notes (Signed)
 Pts great niece called just wanting an update. Added her to emergency contact list. She will be having someone bring by more V-8s as well. If any one is able she would like an update once placement has been decided.

## 2024-11-21 NOTE — ED Notes (Signed)
 Mrs. Deschamps is refusing all vitals at this time.

## 2024-11-21 NOTE — ED Notes (Signed)
 Pt IVC expiring today. Per Dr. Laurice IVC will not be renewed.

## 2024-11-22 NOTE — Progress Notes (Signed)
 PT Cancellation Note  Patient Details Name: [name] MRN: [id number] DOB: [birth year]   Cancelled Treatment:    Reason Eval/Treat Not Completed: Fatigue/lethargy limiting ability to participate  RN reports patient is sleeping, had been agitated  earlier, will follow. [name] PT Acute Rehabilitation Services Office [phone]  [name] 11/22/2024, 1:22 PM

## 2024-11-22 NOTE — Progress Notes (Addendum)
 CSW will outreach to [employer] during normal business hours to inquire about whether they have obtained access to pt's funding.   Addend @ 8:00AM Awaiting response from [employer]. CSW requested update.   Addend @ 3:15PM CSW followed up with Etha Chang. Awaiting response.   Addend @ 3:27PM Nichole reported she outreached to family and is aware of what the pt has but states she does not have access to it. She reported she asked the family if they are willing to take pt to [place]  to be near them (family) but she has not gotten a response. She stated the office is closed and she will work on a petition but is unsure when it will be heard in court .  Addend @ 2:28PM Spoke with Etha Chang who reported she will apply for LTC Medicaid today. She reported visiting with Ms. Ebey and made aware plan is for continued comfort care. Nichole stated that family is agreeable and cooperative with plan of care and as long as this is the case, there is no need for an ex parte order. Nichole reported she will follow up with Madelin Jacobus to see if [facility] will consider pt for LTC w/hospice.

## 2024-11-22 NOTE — Progress Notes (Signed)
 " Physical Therapy Treatment Patient Details Name: [name] MRN: [id number] DOB: 02-19-1927 Today's Date: 11/22/2024   History of Present Illness [name] is a 89 y.o. female brought to the ER 11/15/23 from SNF with aggressive behavior. Recenttly at [facility] for CHF, new onset A fib , periods of agitation and delirium. DC to SNF.PMH: chronic HFpEF, moderate aortic stenosis.   hypertension, chronic kidney disease stage III, anemia    PT Comments  The patient is alert and  able to participate in mobility, stating that she needs to get  to BR. Mod/max assist to pivot to [facility] and back to bed, feet tending to slide , blocked the  feet. Patient will benefit from continued inpatient follow up therapy, <3 hours/day.    If plan is discharge home, recommend the following: A lot of help with walking and/or transfers;A lot of help with bathing/dressing/bathroom   Can travel by private vehicle     No  Equipment Recommendations  None recommended by PT    Recommendations for Other Services       Precautions / Restrictions Precautions Precautions: Fall     Mobility  Bed Mobility   Bed Mobility: Rolling, Sidelying to Sit, Sit to Sidelying Rolling: Contact guard assist Sidelying to sit: Mod assist   Sit to supine: Max assist   General bed mobility comments: assist trunk and legs    Transfers Overall transfer level: Needs assistance   Transfers: Sit to/from Stand, Bed to chair/wheelchair/BSC Sit to Stand: Max assist           General transfer comment: assist to partially stand, feet blocked so not to slide forward. Pivot to [facility] and back to bed.    Ambulation/Gait                   Stairs             Wheelchair Mobility     Tilt Bed    Modified Rankin (Stroke Patients Only)       Balance Overall balance assessment: Needs assistance, History of Falls Sitting-balance support: Feet supported, No upper extremity supported Sitting balance-Leahy Scale:  Fair     Standing balance support: During functional activity, Bilateral upper extremity supported, Reliant on assistive device for balance Standing balance-Leahy Scale: Poor                              Communication Communication Communication: Impaired Factors Affecting Communication: Hearing impaired  Cognition Arousal: Alert Behavior During Therapy: WFL for tasks assessed/performed, Anxious                           PT - Cognition Comments: oriented to self,  able to follow simple directions, stated that she needed to go to the bathroom Following commands: Impaired Following commands impaired: Follows one step commands with increased time    Cueing Cueing Techniques: Verbal cues, Tactile cues  Exercises      General Comments        Pertinent Vitals/Pain Pain Assessment Pain Assessment: No/denies pain    Home Living                          Prior Function            PT Goals (current goals can now be found in the care plan section) Acute Rehab PT Goals Patient  Stated Goal: I want to go home PT Goal Formulation: Patient unable to participate in goal setting Time For Goal Achievement: 12/06/24 Potential to Achieve Goals: Fair Progress towards PT goals: Progressing toward goals    Frequency    Min 2X/week      PT Plan      Co-evaluation              AM-PAC PT 6 Clicks Mobility   Outcome Measure  Help needed turning from your back to your side while in a flat bed without using bedrails?: A Little Help needed moving from lying on your back to sitting on the side of a flat bed without using bedrails?: A Little Help needed moving to and from a bed to a chair (including a wheelchair)?: A Lot Help needed standing up from a chair using your arms (e.g., wheelchair or bedside chair)?: A Lot Help needed to walk in hospital room?: Total Help needed climbing 3-5 steps with a railing? : Total 6 Click Score: 12    End  of Session Equipment Utilized During Treatment: Gait belt Activity Tolerance: Patient tolerated treatment well Patient left: in bed;with call bell/phone within reach;with bed alarm set Nurse Communication: Mobility status PT Visit Diagnosis: Unsteadiness on feet (R26.81);Muscle weakness (generalized) (M62.81);History of falling (Z91.81);Adult, failure to thrive (R62.7)     Time: 8469-8450 PT Time Calculation (min) (ACUTE ONLY): 19 min  Charges:    $Therapeutic Activity: 8-22 mins PT General Charges $$ ACUTE PT VISIT: 1 Visit                     [name] PT Acute Rehabilitation Services Office [phone]    [name] 11/22/2024, 4:37 PM   "

## 2024-11-22 NOTE — ED Notes (Signed)
 Pt given dinner tray and insisted on feeding herself.  Pt ate very little.  Pt will only drink water .  We are out of her V8 juice and this writer let her family know earlier on a phone call.  Pts tray cleared and pt left with her water  to drink.  Pt calm.

## 2024-11-22 NOTE — ED Provider Notes (Signed)
 Emergency Medicine Observation Re-evaluation Note  [name] is a 89 y.o. female, seen on rounds today.  Pt initially presented to the ED for complaints of Aggressive Behavior Currently, the patient is resting comfortable.  Physical Exam  BP (!) 142/84 (BP Location: Right Wrist)   Pulse 73   Temp 98.2 F (36.8 C)   Resp 16   Ht 5' 1 (1.549 m)   Wt 47.2 kg   SpO2 98%   BMI 19.65 kg/m  Physical Exam General: well appearing, resting comfortable Cardiac: normal heart rate and rhythm Lungs: equal breath sounds Psych: calm, cooperative  ED Course / MDM  EKG:EKG Interpretation Date/Time:  Sunday November 14 2024 22:22:12 EST Ventricular Rate:  86 PR Interval:  198 QRS Duration:  106 QT Interval:  421 QTC Calculation: 504 R Axis:   -55  Text Interpretation: Sinus rhythm Atrial premature complexes LAD, consider left anterior fascicular block Left ventricular hypertrophy Anterior infarct, old Prolonged QT interval Confirmed by Midge Golas ([id number]) on 11/15/2024 2:36:10 PM  I have reviewed the labs performed to date as well as medications administered while in observation.  Recent changes in the last 24 hours include -- no significant new changes.  Plan  Current plan is for pending placement for hospice or nursing home at [place]     Rosan Sherlean DEL, PA-C 11/22/24 [id number]    Bari Roxie HERO, DO 11/22/24 [id number]

## 2024-11-22 NOTE — ED Notes (Signed)
 Pt resting. Meds held.  Pt very agitated at staff when woken.

## 2024-11-23 MED ORDER — GLYCOPYRROLATE 0.2 MG/ML IJ SOLN
0.2000 mg | INTRAMUSCULAR | Status: AC | PRN
  Filled 2024-11-23: qty 1

## 2024-11-23 MED ORDER — ONDANSETRON HCL 4 MG/2ML IJ SOLN: 4.0000 mg | Freq: Four times a day (QID) | INTRAMUSCULAR | Status: AC | PRN

## 2024-11-23 MED ORDER — OXYCODONE HCL 5 MG/5ML PO SOLN
2.5000 mg | Freq: Four times a day (QID) | ORAL | Status: AC
  Administered 2024-11-23 – 2024-12-01 (×13): 2.5 mg via ORAL
  Filled 2024-11-23 (×12): qty 5

## 2024-11-23 MED ORDER — LORAZEPAM 2 MG/ML IJ SOLN
1.0000 mg | INTRAMUSCULAR | Status: DC | PRN
  Administered 2024-12-02: 1 mg via INTRAVENOUS
  Filled 2024-11-23: qty 1

## 2024-11-23 MED ORDER — HALOPERIDOL LACTATE 2 MG/ML PO CONC: 0.5000 mg | ORAL | Status: DC | PRN

## 2024-11-23 MED ORDER — ACETAMINOPHEN 325 MG PO TABS
650.0000 mg | ORAL_TABLET | Freq: Four times a day (QID) | ORAL | Status: DC | PRN
  Administered 2024-11-24 – 2024-11-29 (×2): 650 mg via ORAL
  Filled 2024-11-23 (×2): qty 2

## 2024-11-23 MED ORDER — QUETIAPINE FUMARATE 25 MG PO TABS
12.5000 mg | ORAL_TABLET | Freq: Once | ORAL | Status: DC
  Filled 2024-11-23: qty 1

## 2024-11-23 MED ORDER — ACETAMINOPHEN 650 MG RE SUPP: 650.0000 mg | Freq: Four times a day (QID) | RECTAL | Status: DC | PRN

## 2024-11-23 MED ORDER — LORAZEPAM 2 MG/ML PO CONC: 1.0000 mg | ORAL | Status: DC | PRN

## 2024-11-23 MED ORDER — OXYCODONE HCL 20 MG/ML PO CONC: 5.0000 mg | ORAL | Status: DC | PRN

## 2024-11-23 MED ORDER — FUROSEMIDE 40 MG PO TABS
40.0000 mg | ORAL_TABLET | ORAL | Status: AC
  Administered 2024-11-23: 40 mg via ORAL

## 2024-11-23 MED ORDER — HYDROMORPHONE HCL 1 MG/ML IJ SOLN
0.5000 mg | INTRAMUSCULAR | Status: DC | PRN
  Filled 2024-11-23: qty 1

## 2024-11-23 MED ORDER — BIOTENE DRY MOUTH MT LIQD: 15.0000 mL | OROMUCOSAL | Status: DC | PRN

## 2024-11-23 MED ORDER — POLYVINYL ALCOHOL 1.4 % OP SOLN
1.0000 [drp] | Freq: Four times a day (QID) | OPHTHALMIC | Status: DC | PRN
  Filled 2024-11-23: qty 15

## 2024-11-23 MED ORDER — HALOPERIDOL 0.5 MG PO TABS: 0.5000 mg | ORAL_TABLET | ORAL | Status: DC | PRN

## 2024-11-23 MED ORDER — ONDANSETRON 4 MG PO TBDP: 4.0000 mg | ORAL_TABLET | Freq: Four times a day (QID) | ORAL | Status: AC | PRN

## 2024-11-23 MED ORDER — GLYCOPYRROLATE 1 MG PO TABS
1.0000 mg | ORAL_TABLET | ORAL | Status: DC | PRN
  Filled 2024-11-23: qty 1

## 2024-11-23 MED ORDER — HALOPERIDOL LACTATE 5 MG/ML IJ SOLN
0.5000 mg | INTRAMUSCULAR | Status: DC | PRN
  Administered 2024-11-29: 0.5 mg via INTRAVENOUS
  Filled 2024-11-23: qty 1

## 2024-11-23 MED ORDER — LORAZEPAM 1 MG PO TABS
1.0000 mg | ORAL_TABLET | ORAL | Status: AC | PRN
  Filled 2024-11-23: qty 1

## 2024-11-23 MED ORDER — OLANZAPINE 5 MG PO TBDP
5.0000 mg | ORAL_TABLET | Freq: Every day | ORAL | Status: AC
  Administered 2024-11-23 – 2024-12-01 (×2): 5 mg via ORAL
  Filled 2024-11-23 (×6): qty 1

## 2024-11-23 NOTE — Consult Note (Signed)
 "                                                                                   Consultation Note Date: 11/23/2024   Patient Name: [name]  DOB: 04-Jul-1927  MRN: [id number]  Age / Sex: 89 y.o., female  PCP: Okey Carlin Redbird, MD Referring Physician: Neysa Caron PARAS, DO  Reason for Consultation:  pt with aggressive behavior, CHF- recommendations on medication management and placement options   HPI/Patient Profile: 89 y.o. female  with past medical history of CHF, cognitive dysfunction, interstitial lung disease, a fib, presented to the ED on 11/14/2024 from skilled facility due to aggressive behaviors. ED providers discussed care with patient's family and plan was determined to be comfort care and hospice. Hospice has been consulted. Unfortunately, patient does not qualify for inpatient hospice.  Transition of care is working on placement. Palliative medicine consulted for assistance with symptom management and placement options.   Primary Decision Maker NEXT OF KIN  Discussion: Chart reviewed including ED provider notes, nursing notes and PT notes as well as Palliative notes from prior admission.  Patient was hospitalized 1/4-1/12 for CHF exacerbation and goals initially were determined to be comfort and hospice. However, plans changed to discharge to SNF for rehab due to nowhere for patient to go to receive hospice care.  She now returns from SNF due to aggressive behaviors. Per notes from last admission she had some agitation and delirium during the admission. She has been confused during this ED visit.   Chest xray from 1/22 showed mild pulmonary edema. She is eating minimally.  She is now refusing medications and interventions.  Per review of ED note from last night- she became short of breath and anxious was given a dose of lasix  and seroquel .   There has also been an APS referral made per social worker note- as patient's financial POA has not returned phone calls.   On  evaluation patient was sleeping. She aroused briefly to my voice and said, I am fine, but didn't answer any other questions.   I was able to speak to patient's nephew [name]. There is no HCPOA document. Patient does have a living will on file but it doesn't designate an HCPOA.  I reviewed [place] with [name] regarding surrogate decision makers for healthcare decisions.  See: [place]  General Statutes Chapter 90. Medicine and Allied Occupations  90-21.13. Informed consent to health care treatment or procedure. In brief summary, this statute outlines that the following persons, in order indicated, are authorized to consent to medical treatment on behalf of the patient who does not demonstrate capacity to do so: Legally assigned guardian appointed by the court > healthcare agent appointed by legal healthcare power of attorney > healthcare agent appointed by the patient > legal spouse > majority of the patient's reasonably available parents and children who are at least 35 years of age > individual who has an established relationship with the patient, who is acting in good faith on behalf of the patient, and who can reliably convey the patient's wishes > attending physician with confirmation by another physician of the patient's condition and the necessity  for treatment (unless in urgent/emergent situation).  Patient does not have HCPOA document- there is no spouse, children, or parents- therefore decision maker is a person with an established relationship who is willing and has patient's best interest at heart.   Victoria stated he and his sister and cousin are willing to make decisions together. [name] agreed to be primary contact for providers while patient is hospitalized.   We discussed goals of care for patient. Victoria confirmed that goals are to focus on comfort only, no further diagnostics or treatments. Discussed transition to comfort measures only which includes stopping IV  fluids, antibiotics, labs and providing symptom management for SOB, anxiety, nausea, vomiting, and other symptoms of dying.   Disposition was touched on briefly. Patient is unable to return home with hospice due to lack of caregivers. She doesn't qualify for inpatient hospice at this time. Victoria will continue to work with transition of care team and hospice on disposition.    SUMMARY OF RECOMMENDATIONS -CHF, interstitial lung disease, pulmonary edema, cognitive dysfunction with agitation- goals are for comfort measures only -Symptom management-  -oxycodone  solution 2.5mg  q6hr -oxycodone  solution 2.5mg  q1hr prn for any discomfort or SOB -hydromorphone  0.5mg  IV q2hr prn for any discomfort or SOB -lorazepam  1mg  iv/po q4hr prn anxiety -haldol  0.5mg  IV/po q4hr prn agitation -zyprexa  ODT 5mg  nightly  -other comfort medications as ordered -Palliative care does not have any solution to the systemic lack of insurance sponsored long-term care. - will defer disposition planning to transition of care team -Discussed with attending Dr. Rolan Quale and referring provider Dr. Almarie Burner     Code Status/Advance Care Planning:   Code Status: Do not attempt resuscitation (DNR) - Comfort care    Prognosis:   Unable to determine  Discharge Planning: To Be Determined  Primary Diagnoses: Present on Admission: **None**   Review of Systems  Unable to perform ROS: Mental status change    Physical Exam Vitals and nursing note reviewed.  Constitutional:      Appearance: She is ill-appearing.     Comments: Cachectic, frail  Cardiovascular:     Rate and Rhythm: Normal rate.  Pulmonary:     Effort: Pulmonary effort is normal.  Neurological:     Comments: sleeping     Vital Signs: BP 138/62 (BP Location: Right Arm)   Pulse 62   Temp 97.7 F (36.5 C)   Resp 16   Ht 5' 1 (1.549 m)   Wt 47.2 kg   SpO2 96%   BMI 19.65 kg/m  Pain Scale: 0-10   Pain Score: 0-No pain   SpO2: SpO2: 96  % O2 Device:SpO2: 96 % O2 Flow Rate: .   IO: Intake/output summary: No intake or output data in the 24 hours ending 11/23/24 1204  LBM:   Baseline Weight: Weight: 47.2 kg Most recent weight: Weight: 47.2 kg       Thank you for this consult. Palliative medicine will continue to follow and assist as needed.  Time Total: 80 minutes Signed by: Cassondra Stain, AGNP-C Palliative Medicine  Time includes:   I personally spent a total of 80 minutes in the care of the patient today including preparing to see the patient, getting/reviewing separately obtained history, performing a medically appropriate exam/evaluation, counseling and educating, placing orders, referring and communicating with other health care professionals, documenting clinical information in the EHR, and coordinating care.   Please contact Palliative Medicine Team phone at [phone] for questions and concerns.  For individual provider: See Amion               "

## 2024-11-23 NOTE — ED Provider Notes (Signed)
 Notified by nursing of increased work of breathing.  Patient assessed at bedside, she is anxious, tachypneic.  Lungs are clear on evaluation.  She does have CHF, will trial dose of Lasix .  She is satting 100% on room air.  On record review she is comfort care with plan for hospice.  Palliative care consult placed to see if there are additional measures we can take to ensure she is more comfortable and potentially help identify a suitable place for her to continue her comfort measures.  She does appear anxious, will provide a dose of Seroquel  as well.   Griselda Norris, MD 11/23/24 [phone]

## 2024-11-23 NOTE — ED Provider Notes (Signed)
 Emergency Medicine Observation Re-evaluation Note  [name] is a 89 y.o. female, seen on rounds today.  Pt initially presented to the ED for complaints of Aggressive Behavior Currently, the patient is resting.  Physical Exam  BP 138/62 (BP Location: Right Arm)   Pulse 62   Temp 97.7 F (36.5 C)   Resp 16   Ht 5' 1 (1.549 m)   Wt 47.2 kg   SpO2 96%   BMI 19.65 kg/m  Physical Exam General: nad Cardiac: good peripheral perfusion Lungs: bilateral chest rise Psych: resting comfortably   ED Course / MDM  EKG:EKG Interpretation Date/Time:  Sunday November 14 2024 22:22:12 EST Ventricular Rate:  86 PR Interval:  198 QRS Duration:  106 QT Interval:  421 QTC Calculation: 504 R Axis:   -55  Text Interpretation: Sinus rhythm Atrial premature complexes LAD, consider left anterior fascicular block Left ventricular hypertrophy Anterior infarct, old Prolonged QT interval Confirmed by Midge Golas ([id number]) on 11/15/2024 2:36:10 PM  I have reviewed the labs performed to date as well as medications administered while in observation.  Recent changes in the last 24 hours include no acute .  Plan  Current plan is for placement.      Emil Share, DO 11/23/24 [phone]

## 2024-11-23 NOTE — ED Notes (Signed)
 Pt sleeping 2300 pain med held, no signs of pain or distress noted.

## 2024-11-23 NOTE — ED Notes (Addendum)
 Patient woke up and all of a sudden stated that she couldn't breathe. Her O2 is 100%. She refused all meds today including her PO lasix . Notified Dr. Griselda. She stated she would come and assess.   Dr. Griselda asked me to give the PO lasix  she refused earlier and seroquel . She also placed a pallaitive consult. Will try to give and continue to monitor.

## 2024-11-23 NOTE — Progress Notes (Signed)
 Outreached to APS to inquire about whether petition has been submitted. Awaiting response.

## 2024-11-24 ENCOUNTER — Inpatient Hospital Stay: Admitting: Acute Care

## 2024-11-24 NOTE — Progress Notes (Addendum)
 [facility] does not have a female bed available.   [facility]- denied (does not accept medicaid pending) [facility]- denied (waitlist) [facility]- denied (denied by clinical team) [facility]- APS will outreach [facility]- Left vm

## 2024-11-24 NOTE — ED Notes (Addendum)
 Pt is awake and alert. Pt sitting up in bed.

## 2024-11-24 NOTE — ED Notes (Signed)
 Pt stated pain score in back is a 9 out of 10 after receiving scheduled pain medication. Pt refused PRN Dilaudid , adamantly requested Tylenol .

## 2024-11-24 NOTE — Progress Notes (Signed)
 "                                                                                                                                                                                                          Daily Progress Note   Patient Name: [name]       Date: 11/24/2024 DOB: 06-08-1927  Age: 89 y.o. MRN#: [id number] Attending Physician: Ruthe Cornet, DO Primary Care Physician: Okey Carlin Redbird, MD Admit Date: 11/14/2024  Reason for Follow-up: Establishing goals of care  Patient Profile/HPI: 89 y.o. female  with past medical history of CHF, cognitive dysfunction, interstitial lung disease, a fib, presented to the ED on 11/14/2024 from skilled facility due to aggressive behaviors. ED providers discussed care with patient's family and plan was determined to be comfort care and hospice. Hospice has been consulted. Unfortunately, patient does not qualify for inpatient hospice.  Transition of care is working on placement. Palliative medicine consulted for assistance with symptom management and placement options.   Discussion: Chart reviewed. No prn medications needed.  Discussed with bedside nurse and nurse tech- patient without agitation. Has remained calm and comfortable.  She is not eating or drinking.  On eval- pt sleeping, did not arouse to my voice.   Review of Systems  Unable to perform ROS: Mental status change     Physical Exam Vitals and nursing note reviewed.  Constitutional:      Comments: cachectic  Cardiovascular:     Rate and Rhythm: Normal rate.  Pulmonary:     Effort: Pulmonary effort is normal.  Skin:    General: Skin is warm and dry.     Coloration: Skin is pale.  Neurological:     Comments: somnolent             Vital Signs: BP (!) 141/81 (BP Location: Right Arm)   Pulse 75   Temp 97.9 F (36.6 C)   Resp 16   Ht 5' 1 (1.549 m)   Wt 47.2 kg   SpO2 100%   BMI 19.65 kg/m  SpO2: SpO2: 100 % O2 Device: O2 Device: Room Air O2 Flow Rate:     Intake/output summary: No intake or output data in the 24 hours ending 11/24/24 1803 LBM:   Baseline Weight: Weight: 47.2 kg Most recent weight: Weight: 47.2 kg       Palliative Assessment/Data: PPS: 20%      Patient Active Problem List   Diagnosis Date Noted   Acute pulmonary edema (HCC) 11/19/2024   Aggressive behavior 11/19/2024   Acute hypoxic  respiratory failure (HCC) 11/02/2024   Acute on chronic heart failure with preserved ejection fraction (HFpEF) (HCC) 11/01/2024   ILD (interstitial lung disease) (HCC) 11/01/2024   MCI (mild cognitive impairment) 11/01/2024   DNR (do not resuscitate) 11/01/2024   Hypothyroidism 11/01/2024   Dyslipidemia 11/01/2024   Essential hypertension 11/01/2024   Elevated d-dimer 11/01/2024   Acute CHF (congestive heart failure) (HCC) 10/31/2024   Chronic kidney disease, stage 3b (HCC) 10/31/2024   New onset atrial fibrillation (HCC) 10/31/2024   Elevated troponin 10/31/2024   Aortic stenosis    Impacted cerumen of both ears 02/03/2024   Normocytic normochromic anemia 09/18/2023   S/P shoulder replacement, 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    Palliative Care Assessment & Plan    Assessment/Recommendations/Plan  CHF, interstitial lung disease, pulmonary edema, cognitive dysfunction- goals are for comfort measures only Continue current comfort care as ordered Defer disposition to hospice and transition of care team   Code Status:   Code Status: Do not attempt resuscitation (DNR) - Comfort care   Prognosis:  < 6 months  Discharge Planning: To Be Determined   Thank you for allowing the Palliative Medicine Team to assist in the care of this patient.  I personally spent a total of 15 minutes in the care of the patient today including preparing to see the patient, getting/reviewing separately obtained history, performing a medically appropriate exam/evaluation, referring and communicating with other health care professionals, and documenting  clinical information in the EHR.   Cassondra Stain, AGNP-C Palliative Medicine   Please contact Palliative Medicine Team phone at [phone] for questions and concerns.        "

## 2024-11-24 NOTE — Care Management (Signed)
 RNCM received Hospice consult. Pt. Has pending placement for Facility placement. Will continue to follow.

## 2024-11-24 NOTE — ED Provider Notes (Signed)
 Emergency Medicine Observation Re-evaluation Note  [name] is a 89 y.o. female, seen on rounds today.  Pt initially presented to the ED for complaints of Aggressive Behavior Currently, the patient is resting.  Physical Exam  BP 134/73 (BP Location: Right Arm)   Pulse 63   Temp 97.9 F (36.6 C) (Axillary)   Resp 18   Ht 5' 1 (1.549 m)   Wt 47.2 kg   SpO2 96%   BMI 19.65 kg/m  Physical Exam General: asleep   ED Course / MDM  EKG:EKG Interpretation Date/Time:  Sunday November 14 2024 22:22:12 EST Ventricular Rate:  86 PR Interval:  198 QRS Duration:  106 QT Interval:  421 QTC Calculation: 504 R Axis:   -55  Text Interpretation: Sinus rhythm Atrial premature complexes LAD, consider left anterior fascicular block Left ventricular hypertrophy Anterior infarct, old Prolonged QT interval Confirmed by Midge Golas ([id number]) on 11/15/2024 2:36:10 PM  I have reviewed the labs performed to date as well as medications administered while in observation.  Recent changes in the last 24 hours include nothing.  Plan  Current plan is for TOC placement.    Ruthe Cornet, DO 11/24/24 [phone]

## 2024-11-25 NOTE — ED Provider Notes (Signed)
 Emergency Medicine Observation Re-evaluation Note  [name] is a 89 y.o. female, seen on rounds today.  Pt initially presented to the ED for complaints of Aggressive Behavior Currently, the patient is awaiting TOC placement.  Physical Exam  BP (!) 151/73 (BP Location: Right Arm)   Pulse 73   Temp (!) 97.5 F (36.4 C)   Resp 18   Ht 5' 1 (1.549 m)   Wt 47.2 kg   SpO2 98%   BMI 19.65 kg/m  Physical Exam Alert and in no acute distress  ED Course / MDM  EKG:EKG Interpretation Date/Time:  Sunday November 14 2024 22:22:12 EST Ventricular Rate:  86 PR Interval:  198 QRS Duration:  106 QT Interval:  421 QTC Calculation: 504 R Axis:   -55  Text Interpretation: Sinus rhythm Atrial premature complexes LAD, consider left anterior fascicular block Left ventricular hypertrophy Anterior infarct, old Prolonged QT interval Confirmed by Midge Golas ([id number]) on 11/15/2024 2:36:10 PM  I have reviewed the labs performed to date as well as medications administered while in observation.  Recent changes in the last 24 hours include none.  Plan  Current plan is for TOC placement.    Suzette Pac, MD 11/25/24 [phone]

## 2024-11-25 NOTE — Progress Notes (Addendum)
 APS SW, Etha Chang was informed by [facility] that 5 years worth of bank statements were needed. She stated she will make the request but shared it is up to the bank how quickly she receives them.  Awaiting call back from [employer] and [employer].  Addend @ 12:49PM Tammy assessed pt and informed 5 years worth of bank statements will be needed at any facility. She will outreach to APS SW.

## 2024-11-25 NOTE — Progress Notes (Signed)
 "                                                                                                                                                                                                          Daily Progress Note   Patient Name: [name]       Date: 11/25/2024 DOB: [birth year]  Age: 89 y.o. MRN#: [id number] Attending Physician: Ruthe Cornet, DO Primary Care Physician: Okey Carlin Redbird, MD Admit Date: 11/14/2024  Reason for Follow-up: Establishing goals of care  Patient Profile/HPI: 89 y.o. female  with past medical history of CHF, cognitive dysfunction, interstitial lung disease, a fib, presented to the ED on 11/14/2024 from skilled facility due to aggressive behaviors. ED providers discussed care with patient's family and plan was determined to be comfort care and hospice. Hospice has been consulted. Unfortunately, patient does not qualify for inpatient hospice.  Transition of care is working on placement. Palliative medicine consulted for assistance with symptom management and placement options.   Discussion: Chart reviewed. Received one dose prn Tylenol  yesterday evening.  Continues not to eat or drink.  Complains of back pain- requests heating pad.  Noted per TOC note that APS is working on placement.   Review of Systems  Unable to perform ROS: Mental status change     Physical Exam Vitals and nursing note reviewed.  Constitutional:      General: She is not in acute distress.    Comments: cachectic  Cardiovascular:     Rate and Rhythm: Normal rate.  Pulmonary:     Effort: Pulmonary effort is normal.  Skin:    General: Skin is warm and dry.     Coloration: Skin is pale.  Neurological:     Comments: Awake, alert             Vital Signs: BP (!) 147/69 (BP Location: Left Arm)   Pulse 76   Temp 97.8 F (36.6 C) (Oral)   Resp 16   Ht 5' 1 (1.549 m)   Wt 47.2 kg   SpO2 97%   BMI 19.65 kg/m  SpO2: SpO2: 97 % O2 Device: O2 Device: Room Air O2 Flow  Rate:    Intake/output summary: No intake or output data in the 24 hours ending 11/25/24 1336 LBM:   Baseline Weight: Weight: 47.2 kg Most recent weight: Weight: 47.2 kg       Palliative Assessment/Data: PPS: 20%      Patient Active Problem List   Diagnosis Date Noted   Acute pulmonary edema (HCC) 11/19/2024  Aggressive behavior 11/19/2024   Acute hypoxic respiratory failure (HCC) 11/02/2024   Acute on chronic heart failure with preserved ejection fraction (HFpEF) (HCC) 11/01/2024   ILD (interstitial lung disease) (HCC) 11/01/2024   MCI (mild cognitive impairment) 11/01/2024   DNR (do not resuscitate) 11/01/2024   Hypothyroidism 11/01/2024   Dyslipidemia 11/01/2024   Essential hypertension 11/01/2024   Elevated d-dimer 11/01/2024   Acute CHF (congestive heart failure) (HCC) 10/31/2024   Chronic kidney disease, stage 3b (HCC) 10/31/2024   New onset atrial fibrillation (HCC) 10/31/2024   Elevated troponin 10/31/2024   Aortic stenosis    Impacted cerumen of both ears 02/03/2024   Normocytic normochromic anemia 09/18/2023   S/P shoulder replacement, 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    Palliative Care Assessment & Plan    Assessment/Recommendations/Plan  CHF, interstitial lung disease, pulmonary edema, cognitive dysfunction- goals are for comfort measures only Continue current comfort care as ordered Defer disposition to hospice and transition of care team   Code Status:   Code Status: Do not attempt resuscitation (DNR) - Comfort care   Prognosis:  < 6 months  Discharge Planning: To Be Determined   Thank you for allowing the Palliative Medicine Team to assist in the care of this patient.  I personally spent a total of 15 minutes in the care of the patient today including preparing to see the patient, getting/reviewing separately obtained history, performing a medically appropriate exam/evaluation, referring and communicating with other health care professionals, and documenting  clinical information in the EHR.   Cassondra Stain, AGNP-C Palliative Medicine   Please contact Palliative Medicine Team phone at [phone] for questions and concerns.        "

## 2024-11-25 NOTE — Progress Notes (Signed)
 PT Cancellation Note  Patient Details Name: [name] MRN: [id number] DOB: [birth year]   Cancelled Treatment:    Reason Eval/Treat Not Completed: Other (comment) (pt's family has decided to transition to comfort care and hospice at this time). PT will sign off at this time.  Isaiah DEL. Montrel Donahoe, PT, DPT   [employer] 11/25/2024, 3:31 PM

## 2024-11-26 NOTE — Progress Notes (Signed)
 "  Palliative Medicine Inpatient Follow Up Note   HPI: 89 year old female with past medical history significant of CHF, cognitive dysfunction, interstitial lung disease, A-fib, presented to the ED on 11/14/2024 from a skilled nursing facility due to aggressive behaviors.  ED providers discussed care with patient's family and plan was determined to be comfort care and hospice.  Hospice has been consulted.  Unfortunately, patient does not qualify for inpatient hospice.  Transition of care is working on placement.  Palliative medicine consulted for assistance with symptom management and placement options.   This is hospital day  # 11 Today's Discussion 11/26/2024    I have reviewed medical records including:  EPIC notes: Reviewed prior/recent PMT progress note, patient awaiting for disposition, patient is comfort measures only. Reviewed TOC note from 11/24/24, patient pending placement for facility. No current SNF offers.  Vital signs: Reviewed from 11/26/2024. Stable.  MAR: Reviewed MAR for the past 24 hours, note that patient has been refusing scheduled oxycodone  and Zyprexa . Reviewed PRN meds received over the last 24 hours. None received. Reviewed to assess needs for medication adjustment to optimize comfort.  Available advanced directives in ACP: Living will available from 11/22/2014 Labs: No recent lab works. Patient is on comfort focused pathway.   Patient appears comfortable. Asleep in a fetal position. No non-verbal signs of pain or discomfort noted. Respirations are even and unlabored. No excessive respiratory secretions noted. No family present at bedside currently.  Left undisturbed to optimize comfort.   Discussed and coordinated plan of care with Primary RN. RN reports patient has been sleeping for the most part this morning. No agitation or restlessness reported. No significant event over the last 24 hours. Discussed the plan for SNF placement, pending bed offers. Reviewed that patient  is on comfort-focused pathway.   Patient and her family face treatment option decisions, advanced directive decisions and anticipatory care needs.   Discussed the importance of continued conversation with family and their  medical providers regarding overall plan of care and treatment options, ensuring decisions are within the context of the patients values and GOCs. The patient, family, and medical team were encouraged to revisit these discussions regularly, as preferences and circumstances may change over time.  Questions and concerns addressed   Palliative Support Provided.   Objective Assessment: Vital Signs Vitals:   11/26/24 1238 11/26/24 1255  BP: (!) 143/73   Pulse: 74   Resp: 16   Temp:  97.6 F (36.4 C)  SpO2: 100%     Intake/Output Summary (Last 24 hours) at 11/26/2024 1417 Last data filed at 11/26/2024 0900 Gross per 24 hour  Intake 0 ml  Output --  Net 0 ml   Last Weight  Most recent update: 11/14/2024  5:31 PM    Weight  47.2 kg (104 lb)             Physical Exam Vitals and nursing note reviewed.  Constitutional:      Appearance: She is ill-appearing.  HENT:     Head: Normocephalic and atraumatic.     Mouth/Throat:     Mouth: Mucous membranes are moist.     Pharynx: Oropharynx is clear.  Cardiovascular:     Rate and Rhythm: Normal rate.  Pulmonary:     Comments: Clear, but diminished bilaterally.  Musculoskeletal:     Cervical back: Normal range of motion.     Comments: Generalized weakness   Skin:    General: Skin is warm.  Neurological:     Mental  Status: She is alert.     Comments: Asleep, unable to assess orientation.   Psychiatric:        Mood and Affect: Mood normal.     SUMMARY OF RECOMMENDATIONS    # Complex medical decision-making/goals of care  Code Status: Maintain DNR Comfort No escalation in level of care. Comfort measures only. Defer disposition to hospice and transition of care team. No distressing symptoms per  assessment and RN report.  Palliative medicine team will continue to follow for symptom management.   # Symptom management Tylenol  as needed for mild pain Hydromorphone  as needed for severe pain/discomfort/air hunger/shortness of breath Oxycodone  as needed for moderate pain/shortness of breath/discomfort Ativan  as needed for anxiety/agitation/restlessness Haloperidol  as needed for agitation/delirium Biotene as needed for dry mouth Zofran  as needed for nausea Senna S as needed for constipation Artificial tears as needed for dry eyes Robinul  as needed for excessive oral secretions Palliative medicine is available to assist as needed.    # Psychosocial support Continue to provide psycho-social and emotional support to patient.  # Discharge planning  To be determined. Awaiting for bed offers.  # Prognosis  Less than 6 months. On comfort care.    # Treatment plan discussed with: Nursing  I personally spent a total of 25 minutes in the care of the patient today including preparing to see the patient, getting/reviewing separately obtained history, performing a medically appropriate exam/evaluation, counseling and educating, documenting clinical information in the EHR, and coordinating care.    Thank you for allowing us  to participate in the care of [name]   ______________________________________________________________________________________ Kathlyne Bolder NP-C  Palliative Medicine Team Team Cell Phone: [phone] Please utilize secure chat with additional questions, if there is no response within 30 minutes please call the above phone number  Palliative Medicine Team providers are available by phone from 7am to 7pm daily and can be reached through the team cell phone.  Should this patient require assistance outside of these hours, please call the patient's attending physician.     "

## 2024-11-26 NOTE — ED Notes (Signed)
 Patient has not eaten this shift. Patient has been sleeping this shift. Patient has been comfortable this shift.  Irritable when is awakened.  Patient needs assist with all ADLs.

## 2024-11-27 NOTE — Progress Notes (Signed)
 [facility] Liaison Note  [facility] continues to follow for discharge disposition.  Please call with any questions or concerns.  Thank you, Randine Nail, BSN, [facility] [phone]

## 2024-11-27 NOTE — Progress Notes (Signed)
 APS SW informed she has petitioned to receive bank statements but shared it can take up to 10 days.   CSW informed ICM director on 1/29 that [facility] would need a continuous contract until [place] is in place. ICM following.   Madelin Jacobus approved pt clinically but does not currently have a bed.

## 2024-11-27 NOTE — ED Provider Notes (Signed)
" °  [place] EMERGENCY DEPARTMENT AT [facility] Emergency Medicine Observation Re-evaluation Note  OPAL DINNING is a 89 y.o. female, seen on rounds today.  Pt initially presented on 11/14/24 at 1702 to the ED for complaints of  Chief Complaint  Patient presents with   Aggressive Behavior   PMHx: CKD stage IIIb, GERD, hyperlipidemia, hypertension and osteoarthritis  Currently, the patient is sleeping quietly in bed.  Physical Exam  BP 120/80 (BP Location: Right Arm)   Pulse 71   Temp 98.3 F (36.8 C) (Axillary)   Resp 16   Ht 5' 1 (1.549 m)   Wt 47.2 kg   SpO2 96%   BMI 19.65 kg/m  Physical Exam General: NAD Lungs: Normal effort Psych: Currently calm  ED Course / MDM  EKG:EKG Interpretation Date/Time:  Sunday November 14 2024 22:22:12 EST Ventricular Rate:  86 PR Interval:  198 QRS Duration:  106 QT Interval:  421 QTC Calculation: 504 R Axis:   -55  Text Interpretation: Sinus rhythm Atrial premature complexes LAD, consider left anterior fascicular block Left ventricular hypertrophy Anterior infarct, old Prolonged QT interval Confirmed by Midge Golas ([id number]) on 11/15/2024 2:36:10 PM  I have reviewed the labs performed to date as well as medications administered while in observation.  Recent changes in the last 24 hours include social work note indicates that they are still working on obtaining Medicaid. Home medications: Comfort care medications ordered by prior team  diet: Reordered by prior team  Plan  Current plan is for awaiting TOC placement.   Rogelia Jerilynn RAMAN, MD 11/27/24 1133  "

## 2024-11-28 MED ORDER — HYDROMORPHONE HCL 1 MG/ML IJ SOLN
0.5000 mg | INTRAMUSCULAR | Status: DC | PRN
  Administered 2024-11-28 – 2024-11-29 (×3): 0.5 mg via INTRAMUSCULAR
  Filled 2024-11-28 (×2): qty 1

## 2024-11-28 NOTE — ED Notes (Signed)
 Pt has complaints of 10/10 pain in the back. PRN medication given. Pt repositioned.

## 2024-11-28 NOTE — Progress Notes (Signed)
 "  Palliative Medicine Inpatient Follow Up Note   HPI: 89 year old female with past medical history significant of CHF, cognitive dysfunction, interstitial lung disease, A-fib, presented to the ED on 11/14/2024 from a skilled nursing facility due to aggressive behaviors. ED providers discussed care with patient's family and plan was determined to be comfort care and hospice. Hospice has been consulted. Unfortunately, patient does not qualify for inpatient hospice. Transition of care is working on placement. Palliative medicine consulted for assistance with symptom management and placement options.   This is hospital day #: 12 Today's Discussion 11/28/2024    I have reviewed medical records including:  EPIC notes: Reviewed TOC and hospice liaison progress note from 11/27/2024.  Per TOC, APS reported that they have petition to receive bank statement but shared it can take up to 10 days.  Devora [place] would need continuous control until [place] is in place.  ICM following.  Hospice liaison continues to follow discharge disposition. MAR: Reviewed PRN meds received over the last 24 hours.  No as needed medicine received.  Reviewed to assess needs for medication adjustment to optimize comfort.  Available advanced directives in ACP: Patient has a living will document uploaded on file. Labs: No recent labs.  Patient is on comfort focused pathway.  Visited patient at bedside.  Observed patient to be resting comfortably in bed in a Semi-Fowler's position watching television.  Not so interactive and prefers to be left alone or undisturbed.  Noted for no signs of any acute distress.  I asked if she was in pain or in any discomfort, she denies.  She reports just wanting to go home.  Shortly after I met with the patient care attendant who is monitoring patient for safety.  She reports patient has been calm and, no signs or exhibits of any distress or behaviors that would need intervention.  I discussed plan of care.   Discussed that the patient is awaiting discharge disposition.  Goals of care remain unchanged.  Patient remains on comfort measures only, pending disposition.  TOC and hospice liaison following patient.  Palliative Support Provided.   Objective Assessment: Vital Signs Vitals:   11/28/24 0030 11/28/24 0612  BP: (!) 158/69 135/68  Pulse: 76 (!) 103  Resp: 16 16  Temp: 97.8 F (36.6 C) 97.9 F (36.6 C)  SpO2: 97% 98%   No intake or output data in the 24 hours ending 11/28/24 1009 Last Weight  Most recent update: 11/14/2024  5:31 PM    Weight  47.2 kg (104 lb)             Physical Exam Vitals and nursing note reviewed.  Constitutional:      Appearance: She is ill-appearing.  HENT:     Head: Normocephalic and atraumatic.     Mouth/Throat:     Mouth: Mucous membranes are moist.     Pharynx: Oropharynx is clear.  Cardiovascular:     Rate and Rhythm: Normal rate.  Pulmonary:     Comments: Clear, but diminished bilaterally.  Musculoskeletal:     Cervical back: Normal range of motion.     Comments: Generalized weakness   Skin:    General: Skin is warm.  Neurological:     Mental Status: She is alert. Mental status is at baseline.  Psychiatric:        Mood and Affect: Mood normal.     SUMMARY OF RECOMMENDATIONS    # Complex medical decision-making/goals of care  Code Status: Continue with DNR comfort  No escalation in level of care.  Comfort measures only Defer disposition to hospice and transition of care team No distressing symptoms per assessment and RN report The palliative medicine team will continue to follow for symptom management.  # Symptom management Continue with the following comfort medications: Tylenol  as needed for mild pain Hydromorphone  as needed for severe pain/discomfort/air hunger/shortness of breath Oxycodone  as needed for moderate pain/shortness of breath/discomfort Ativan  as needed for anxiety/restlessness/agitation Haloperidol  as needed  for delirium/agitation Biotene as needed for dry mouth Zofran  as needed for nausea Senna S as needed for constipation Artificial tears as needed for dry eyes Robinul  as needed for excessive oral secretions   # Psychosocial support Continue to provide psycho-social and emotional support to patient and family.  # Discharge planning  Pending disposition.  Ultimately long-term care/SNF with hospice on board.  # Prognosis  Prognosis is poor given patient's high disease burden and high risk for decompensation.    # Treatment plan discussed with: Nursing staff.  I personally spent a total of 25 minutes in the care of the patient today including preparing to see the patient, getting/reviewing separately obtained history, performing a medically appropriate exam/evaluation, and documenting clinical information in the EHR.   Thank you for allowing us  to participate in the care of [name]   ______________________________________________________________________________________ Kathlyne Bolder NP-C [facility] Palliative Medicine Team Team Cell Phone: [phone] Please utilize secure chat with additional questions, if there is no response within 30 minutes please call the above phone number  Palliative Medicine Team providers are available by phone from 7am to 7pm daily and can be reached through the team cell phone.  Should this patient require assistance outside of these hours, please call the patient's attending physician.     "

## 2024-11-28 NOTE — ED Notes (Addendum)
 Pt began complaining of 7/10 back pain, medication given, pt repositioned for comfort.

## 2024-11-28 NOTE — ED Provider Notes (Signed)
 Emergency Medicine Observation Re-evaluation Note  [name] is a 89 y.o. female, seen on rounds today.  Pt initially presented to the ED for complaints of Aggressive Behavior Currently, the patient is resting  Physical Exam  BP 135/68 (BP Location: Right Arm)   Pulse (!) 103   Temp 97.9 F (36.6 C) (Oral)   Resp 16   Ht 5' 1 (1.549 m)   Wt 47.2 kg   SpO2 98%   BMI 19.65 kg/m  Physical Exam General: nad Cardiac: rr Lungs: non labored Psych: calm  ED Course / MDM  EKG:EKG Interpretation Date/Time:  Sunday November 14 2024 22:22:12 EST Ventricular Rate:  86 PR Interval:  198 QRS Duration:  106 QT Interval:  421 QTC Calculation: 504 R Axis:   -55  Text Interpretation: Sinus rhythm Atrial premature complexes LAD, consider left anterior fascicular block Left ventricular hypertrophy Anterior infarct, old Prolonged QT interval Confirmed by Midge Golas ([id number]) on 11/15/2024 2:36:10 PM  I have reviewed the labs performed to date as well as medications administered while in observation.  Recent changes in the last 24 hours include none.  Plan  Current plan is for TOC placement.    Neysa Caron PARAS, DO 11/28/24 [phone]

## 2024-11-29 DIAGNOSIS — R627 Adult failure to thrive: Secondary | ICD-10-CM | POA: Diagnosis present

## 2024-11-29 MED ORDER — DOCUSATE SODIUM 100 MG PO CAPS: 100.0000 mg | ORAL_CAPSULE | Freq: Two times a day (BID) | ORAL | Status: DC

## 2024-11-29 MED ORDER — CHLORHEXIDINE GLUCONATE CLOTH 2 % EX PADS
6.0000 | MEDICATED_PAD | Freq: Every day | CUTANEOUS | Status: DC
  Administered 2024-11-30 – 2024-12-02 (×3): 6 via TOPICAL

## 2024-11-29 MED ORDER — HYDROMORPHONE HCL 1 MG/ML PO LIQD: 1.0000 mg | ORAL | Status: DC | PRN

## 2024-11-29 MED ORDER — HYDROMORPHONE HCL 1 MG/ML IJ SOLN: 0.5000 mg | INTRAMUSCULAR | Status: DC | PRN

## 2024-11-29 MED ORDER — SODIUM CHLORIDE 0.9% FLUSH: 3.0000 mL | INTRAVENOUS | Status: DC | PRN

## 2024-11-29 MED ORDER — SODIUM CHLORIDE 0.9% FLUSH
3.0000 mL | Freq: Two times a day (BID) | INTRAVENOUS | Status: DC
  Administered 2024-11-29 – 2024-12-02 (×4): 3 mL via INTRAVENOUS

## 2024-11-29 MED ORDER — SENNA 8.6 MG PO TABS: 1.0000 | ORAL_TABLET | Freq: Two times a day (BID) | ORAL | Status: DC

## 2024-11-29 MED ORDER — SODIUM CHLORIDE 0.9 % IV SOLN: 250.0000 mL | INTRAVENOUS | Status: AC | PRN

## 2024-11-29 NOTE — Progress Notes (Signed)
 " Palliative Medicine Inpatient Follow Up Note   HPI: 89 year old female with past medical history significant of CHF, cognitive dysfunction, interstitial lung disease, A-fib, presented to the ED on 11/14/2024 from a skilled nursing facility due to aggressive behaviors. ED providers discussed care with patient's family and plan was determined to be comfort care and hospice. Hospice has been consulted. Unfortunately, patient does not qualify for inpatient hospice. Transition of care is working on placement. Palliative medicine consulted for assistance with symptom management and placement options. She remains admitted for end of life care.  This is hospital day #: 12  I have reviewed medical records including: MAR shows patient has required doses of PRN dilaudid  twice in the past 24 hours. Missed her Zyprexa  last night.  No as needed medication has been given for agitation.  Today's Discussion 11/29/2024 Patient was resting after receiving additional dose of as needed Dilaudid  just prior to my arrival.  Did not arouse her in order to preserve her comfort.  No visitors present.  Objective Assessment: Per nursing staff, patient's oral intake is very minimal.  They have been mostly successful with attempts at redirection when she becomes agitated.  Dilaudid  is effective for dyspnea.  Vital Signs Vitals:   11/28/24 2242 11/29/24 0616  BP: 123/77 123/77  Pulse: 81 65  Resp: (!) 24 (!) 22  Temp: 97.7 F (36.5 C) 98.1 F (36.7 C)  SpO2: 98% 95%    Physical Exam Vitals and nursing note reviewed.  Constitutional:      General: She is not in acute distress.    Appearance: She is ill-appearing.  HENT:     Head: Normocephalic and atraumatic.  Cardiovascular:     Rate and Rhythm: Normal rate.  Pulmonary:     Effort: Pulmonary effort is normal.  Neurological:     Comments: Sleeping, did not assess  Psychiatric:     Comments: Calm and resting     SUMMARY OF RECOMMENDATIONS    # End-of-life  care Code Status: Continue with DNR comfort Continue full comfort measures as below Changed Dilaudid  injection to subcutaneous and increased frequency to every 30 minutes as needed No adjustments to as needed medications for agitation since she has not received any yet PMT will continue to follow and support   # Symptom management Tylenol  as needed for mild pain Hydromorphone  as needed for severe pain/discomfort/air hunger/shortness of breath Oxycodone  as needed for moderate pain/shortness of breath/discomfort Ativan  as needed for anxiety/restlessness/agitation Haloperidol  as needed for delirium/agitation Biotene as needed for dry mouth Zofran  as needed for nausea Senna S as needed for constipation Artificial tears as needed for dry eyes Robinul  as needed for excessive oral secretions    # Discharge planning Likely appropriate for hospice facility though no beds currently available  # Prognosis < 2 weeks    # Treatment plan discussed with: Nursing staff.  I personally spent a total of 25 minutes in the care of the patient today including preparing to see the patient, getting/reviewing separately obtained history, performing a medically appropriate exam/evaluation, placing orders, referring and communicating with other health care professionals, and documenting clinical information in the EHR.   Thank you for allowing us  to participate in the care of Vernell JAYSON Gash  ______________________________________________________________________________________ Mickle Fell, PA-C [facility]iative [facility] Team Team Cell Phone: [phone] Please utilize secure chat with additional questions, if there is no response within 30 minutes please call the above phone number  Palliative Medicine Team providers are available by phone from  7am to 7pm daily and can be reached through the team cell phone.  Should this patient require assistance outside of these hours, please call the  patient's attending physician.     "

## 2024-11-29 NOTE — ED Notes (Signed)
 Care team is aware of concerns staff has with pt needing Pallative Care to provide pain control and continuity of care for the patients best care and interest.

## 2024-11-29 NOTE — ED Notes (Signed)
 Patient awake.  Patient took a couple bites of applesauce and sips of water .  Patient refusing any more. Patient seems to be having difficultly swallowing .

## 2024-11-30 DIAGNOSIS — R627 Adult failure to thrive: Secondary | ICD-10-CM | POA: Diagnosis not present

## 2024-11-30 MED ORDER — HYDROMORPHONE HCL 1 MG/ML IJ SOLN
0.5000 mg | INTRAMUSCULAR | Status: DC | PRN
  Administered 2024-11-30 – 2024-12-02 (×2): 0.5 mg via INTRAVENOUS
  Filled 2024-11-30 (×2): qty 0.5

## 2024-11-30 MED ORDER — ORAL CARE MOUTH RINSE: 15.0000 mL | OROMUCOSAL | Status: DC | PRN

## 2024-11-30 NOTE — Progress Notes (Signed)
" °   11/30/24 1400  Spiritual Encounters  Type of Visit Initial;Attempt (pt unavailable)    SPIRITUAL CARE AND COUNSELING CONSULT NOTE   VISIT SUMMARY I have looked in on Mrs Hunsberger without disturbing her, also to observe family presence and assess if they may benefit from spiritual care support. No family at this time, please page chaplain if needed.   SPIRITUAL ENCOUNTER                                                                                                                                                                       Per nursing instructions, did not disturb Mrs Nop. No family present at this time   SPIRITUAL FRAMEWORK      GOALS      INTERVENTIONS        INTERVENTION OUTCOMES      SPIRITUAL CARE PLAN     Provide decision making, anticipatory grief and bereavement  support as appropriate   If immediate needs arise, please contact [facility] / Behavioral Health 24 hour on call [phone]   Greig LITTIE Delores Elia  11/30/2024 3:16 PM  "

## 2024-11-30 NOTE — Progress Notes (Signed)
 [facility]  [facility] Liaison Note  Received request from Care Manager for interest in [facility].  Chart reviewed and spoke with family to acknowledge referral.  Hospice eligibility confirmed.  Unfortunately, [facility] is not able to offer a room today.  Family and Care Manager aware hospital liaison will follow up tomorrow if room becomes available.  Please do not hesitate to call with questions.  Thank you Inocente Jacobs RN BSN [facility] Liaison [phone]

## 2024-11-30 NOTE — Plan of Care (Signed)
  Problem: Pain Managment: Goal: General experience of comfort will improve and/or be controlled Outcome: Progressing   Problem: Safety: Goal: Ability to remain free from injury will improve Outcome: Progressing

## 2024-12-01 MED ORDER — GLYCOPYRROLATE 1 MG PO TABS: 1.0000 mg | ORAL_TABLET | ORAL | 0 refills | Status: AC | PRN

## 2024-12-01 NOTE — Discharge Instructions (Signed)
 Patient is being discharged to [facility]

## 2024-12-01 NOTE — Care Management Important Message (Signed)
 Important Message  Patient Details No IM Letter given due to Comfort Care Name: [name] MRN: [id number] Date of Birth: [birth year]   Important Message Given:  No     [name] 12/01/2024, 1:51 PM

## 2024-12-01 NOTE — Discharge Summary (Signed)
 Physician Discharge Summary  [name] FMW:[id number] DOB: [birth year] DOA: 11/14/2024  PCP: Okey Carlin Redbird, MD  Admit date: 11/14/2024  Discharge date: 12/01/2024  Admitted From: SNF Disposition: [facility] inpatient hospice facility.  Recommendations for Outpatient Follow-up:  Patient is being discharged to [facility]  Home Health:None Equipment/Devices:None  Discharge Condition: Fair CODE STATUS:DNR Diet recommendation:  Soft blend diet  Brief Summary: This 89 y.o. female with PMH significant for chronic diastolic CHF, cognitive dysfunction, Interstitial lung disease, A-fib not on anticoagulation, CKD stage IIIb, GERD, hyperlipidemia, hypertension, osteoarthritis, sleep disturbance, aortic stenosis, vitamin D  deficiency was brought in the ED from SNF due to aggressive behaviors.  EDP discussed the case with patient's family and the plan was determined to be comfort care and hospice.  Palliative care and hospice has been consulted,  Unfortunately patient does not qualify for inpatient hospice.  Transition of care is working on the placement.  Palliative care is helping with symptom management. Patient has decreased oral intake.  She has stayed in the ED for more than 14 days, so for ED staff unable to manage her agitation.    Patient was recently admitted in [facility] from 10/31/24 to 11/08/2024.  She lives alone and she was found down by the neighbors.  Hospital course complicated by agitation and delirium.  Given age and guarded prognosis palliative care was consulted and care transitioned to full comfort care.    Nephew changed his mind and rescinded comfort care and hospice.  Patient was discharged to skilled nursing facility.   She is admitted this time for placement in a hospice facility.    Patient is now being discharged to [facility] facility for comfort care.  Discharge Diagnoses:  Principal Problem:   Failure to thrive in  adult Active Problems:   Aortic stenosis   Chronic kidney disease, stage 3b (HCC)   New onset atrial fibrillation (HCC)   ILD (interstitial lung disease) (HCC)   Hypothyroidism   Dyslipidemia   Essential hypertension   Acute pulmonary edema (HCC)   Aggressive behavior    Discharge Instructions  Discharge Instructions     Ambulatory referral to Hospice   Complete by: As directed    Pt now receiving comfort care only   Call MD for:  difficulty breathing, headache or visual disturbances   Complete by: As directed    Diet - low sodium heart healthy   Complete by: As directed    Discharge instructions   Complete by: As directed    Patient is being discharged to [facility]   Increase activity slowly   Complete by: As directed       Allergies as of 12/01/2024       Reactions   Codeine Other (See Comments)   Insomnia         Medication List     STOP taking these medications    albuterol  108 (90 Base) MCG/ACT inhaler Commonly known as: VENTOLIN  HFA   cholecalciferol  1000 units tablet Commonly known as: VITAMIN D    levothyroxine  50 MCG tablet Commonly known as: SYNTHROID    multivitamin with minerals Tabs tablet   polyethylene glycol powder 17 GM/SCOOP powder Commonly known as: MiraLax    QUEtiapine  25 MG tablet Commonly known as: SEROQUEL    senna-docusate 8.6-50 MG tablet Commonly known as: Senokot-S   simvastatin  20 MG tablet Commonly known as: ZOCOR    UNABLE TO FIND       TAKE these medications  acetaminophen  500 MG tablet Commonly known as: TYLENOL  Take 500 mg by mouth every 6 (six) hours as needed for mild pain (pain score 1-3) or moderate pain (pain score 4-6).   amLODipine  5 MG tablet Commonly known as: NORVASC  Take 1 tablet (5 mg total) by mouth daily.   glycopyrrolate  1 MG tablet Commonly known as: ROBINUL  Take 1 tablet (1 mg total) by mouth every 4 (four) hours as needed (excessive secretions).   metoprolol   succinate 25 MG 24 hr tablet Commonly known as: TOPROL -XL Take 1 tablet (25 mg total) by mouth daily.        Follow-up Information     Okey Carlin Redbird, MD Follow up in 1 week(s).   Specialty: Family Medicine Contact information: [address] [place] [address] [phone]                Allergies[1]  Consultations: Palliative care   Procedures/Studies: DG Chest 1 View Result Date: 11/18/2024 EXAM: 1 VIEW(S) XRAY OF THE CHEST 11/18/2024 08:17:00 PM COMPARISON: 11/14/2024 CLINICAL HISTORY: Cough. FINDINGS: LUNGS AND PLEURA: Small left pleural effusion. Left basilar airspace opacity. Mild pulmonary edema. No pneumothorax. HEART AND MEDIASTINUM: Aortic atherosclerosis. No acute abnormality of the cardiac and mediastinal silhouettes. BONES AND SOFT TISSUES: Soft tissue anchors overlying left humeral head. Right reverse shoulder arthroplasty noted. Multilevel degenerative disc disease of spine. IMPRESSION: 1. Small left pleural effusion with left basilar airspace disease. 2. Mild pulmonary edema. Electronically signed by: Greig Pique MD 11/18/2024 08:23 PM EST RP Workstation: [id number]   DG Chest 1 View Result Date: 11/14/2024 CLINICAL DATA:  Congestive heart failure. EXAM: CHEST  1 VIEW COMPARISON:  10/31/2024. FINDINGS: The heart size and mediastinal contours are within normal limits. There is atherosclerotic calcification of the aorta. Persistent airspace disease is noted at the left lung base. There are likely small bilateral pleural effusions. No pneumothorax is seen. Shoulder arthroplasty changes are noted on the right. There is evidence of prior rotator cuff surgery on the left. IMPRESSION: 1. Stable left basilar airspace disease, possible atelectasis or infiltrate. 2. Small bilateral pleural effusions. Electronically Signed   By: Leita Birmingham M.D.   On: 11/14/2024 18:42   ECHOCARDIOGRAM COMPLETE Result Date: 11/01/2024    ECHOCARDIOGRAM REPORT   Patient Name:    [name] Date of Exam: 11/01/2024 Medical Rec #:  [id number]       Height:       61.0 in Accession #:    [id number]      Weight:       108.2 lb Date of Birth:  January 28, 1927       BSA:          1.455 m Patient Age:    89 years        BP:           140/76 mmHg Patient Gender: F               HR:           63 bpm. Exam Location:  Inpatient Procedure: 2D Echo, Cardiac Doppler and Color Doppler (Both Spectral and Color            Flow Doppler were utilized during procedure). Indications:    Aortic stenosis I35.0                 CHF-Acute Diastolic I50.31                 Atrial Fibrillation I48.91  History:  Patient has prior history of Echocardiogram examinations, most                 recent 06/03/2024. Signs/Symptoms:Murmur; Risk                 Factors:Hypertension.  Sonographer:    Sydnee Wilson RDCS Referring Phys: [id number] TAYLOR A PARCELLS IMPRESSIONS  1. Left ventricular ejection fraction, by estimation, is 40 to 45%. The left ventricle has mildly decreased function. The left ventricle demonstrates global hypokinesis. There is mild left ventricular hypertrophy. Left ventricular diastolic parameters are consistent with Grade II diastolic dysfunction (pseudonormalization).  2. Right ventricular systolic function is normal. The right ventricular size is normal. There is mildly elevated pulmonary artery systolic pressure. The estimated right ventricular systolic pressure is 42.9 mmHg.  3. Left atrial size was moderately dilated.  4. Moderate pericardial effusion. The pericardial effusion is posterior to the left ventricle and the left atrium. There is no evidence of cardiac tamponade. Moderate pleural effusion in the left lateral region.  5. The mitral valve is normal in structure. Trivial mitral valve regurgitation. No evidence of mitral stenosis.  6. The aortic valve is tricuspid. There is moderate calcification of the aortic valve. There is moderate thickening of the aortic valve. Aortic valve regurgitation is  trivial. Mild aortic valve stenosis. Aortic valve mean gradient measures 11.0 mmHg. Aortic valve Vmax measures 2.08 m/s.  7. The inferior vena cava is normal in size with greater than 50% respiratory variability, suggesting right atrial pressure of 3 mmHg. FINDINGS  Left Ventricle: Left ventricular ejection fraction, by estimation, is 40 to 45%. The left ventricle has mildly decreased function. The left ventricle demonstrates global hypokinesis. The left ventricular internal cavity size was normal in size. There is  mild left ventricular hypertrophy. Left ventricular diastolic parameters are consistent with Grade II diastolic dysfunction (pseudonormalization). Right Ventricle: The right ventricular size is normal. No increase in right ventricular wall thickness. Right ventricular systolic function is normal. There is mildly elevated pulmonary artery systolic pressure. The tricuspid regurgitant velocity is 3.16  m/s, and with an assumed right atrial pressure of 3 mmHg, the estimated right ventricular systolic pressure is 42.9 mmHg. Left Atrium: Left atrial size was moderately dilated. Right Atrium: Right atrial size was normal in size. Pericardium: A moderately sized pericardial effusion is present. The pericardial effusion is posterior to the left ventricle and the left atrium. There is no evidence of cardiac tamponade. Mitral Valve: The mitral valve is normal in structure. Trivial mitral valve regurgitation. No evidence of mitral valve stenosis. Tricuspid Valve: The tricuspid valve is normal in structure. Tricuspid valve regurgitation is mild . No evidence of tricuspid stenosis. Aortic Valve: The aortic valve is tricuspid. There is moderate calcification of the aortic valve. There is moderate thickening of the aortic valve. Aortic valve regurgitation is trivial. Mild aortic stenosis is present. Aortic valve mean gradient measures 11.0 mmHg. Aortic valve peak gradient measures 17.3 mmHg. Aortic valve area, by VTI  measures 1.39 cm. Pulmonic Valve: The pulmonic valve was normal in structure. Pulmonic valve regurgitation is trivial. No evidence of pulmonic stenosis. Aorta: The aortic root is normal in size and structure. Venous: The inferior vena cava is normal in size with greater than 50% respiratory variability, suggesting right atrial pressure of 3 mmHg. IAS/Shunts: No atrial level shunt detected by color flow Doppler. Additional Comments: There is a moderate pleural effusion in the left lateral region.  LEFT VENTRICLE PLAX 2D LVIDd:  4.20 cm     Diastology LVIDs:         3.60 cm     LV e' medial:    4.57 cm/s LV PW:         1.20 cm     LV E/e' medial:  20.4 LV IVS:        0.80 cm     LV e' lateral:   6.74 cm/s LVOT diam:     2.00 cm     LV E/e' lateral: 13.9 LV SV:         80 LV SV Index:   55 LVOT Area:     3.14 cm  LV Volumes (MOD) LV vol d, MOD A2C: 82.9 ml LV vol d, MOD A4C: 60.0 ml LV vol s, MOD A2C: 36.6 ml LV vol s, MOD A4C: 34.3 ml LV SV MOD A2C:     46.3 ml LV SV MOD A4C:     60.0 ml LV SV MOD BP:      33.3 ml RIGHT VENTRICLE RV S prime:     6.31 cm/s TAPSE (M-mode): 1.0 cm LEFT ATRIUM             Index        RIGHT ATRIUM           Index LA diam:        4.50 cm 3.09 cm/m   RA Area:     14.90 cm LA Vol (A2C):   64.0 ml 43.99 ml/m  RA Volume:   37.10 ml  25.50 ml/m LA Vol (A4C):   33.2 ml 22.82 ml/m LA Biplane Vol: 50.1 ml 34.43 ml/m  AORTIC VALVE AV Area (Vmax):    1.47 cm AV Area (Vmean):   1.40 cm AV Area (VTI):     1.39 cm AV Vmax:           208.00 cm/s AV Vmean:          159.667 cm/s AV VTI:            0.576 m AV Peak Grad:      17.3 mmHg AV Mean Grad:      11.0 mmHg LVOT Vmax:         97.60 cm/s LVOT Vmean:        71.333 cm/s LVOT VTI:          0.254 m LVOT/AV VTI ratio: 0.44  AORTA Ao Root diam: 2.50 cm Ao Asc diam:  2.70 cm MITRAL VALVE               TRICUSPID VALVE MV Area (PHT): 4.31 cm    TR Peak grad:   39.9 mmHg MV E velocity: 93.40 cm/s  TR Vmax:        316.00 cm/s MV A velocity:  33.40 cm/s MV E/A ratio:  2.80        SHUNTS                            Systemic VTI:  0.25 m                            Systemic Diam: 2.00 cm Oneil Parchment MD Electronically signed by Oneil Parchment MD Signature Date/Time: 11/01/2024/4:21:00 PM    Final     Subjective: Patient is lying in the bed comfortably.  [facility]  Discharge Exam: Vitals:   12/01/24 0558 12/01/24 1307  BP: (!) 165/87 (!) 184/87  Pulse: 68 73  Resp: 18 15  Temp: 97.6 F (36.4 C) 98.1 F (36.7 C)  SpO2: 98% 97%   Vitals:   11/29/24 1619 11/30/24 0635 12/01/24 0558 12/01/24 1307  BP: (!) 149/69 138/69 (!) 165/87 (!) 184/87  Pulse: 86 77 68 73  Resp: 20 18 18 15   Temp: 98.1 F (36.7 C) 98.2 F (36.8 C) 97.6 F (36.4 C) 98.1 F (36.7 C)  TempSrc: Oral Oral Oral   SpO2: 97% 93% 98% 97%  Weight:      Height:        General: Pt is alert, awake, not in acute distress Cardiovascular: RRR, S1/S2 +, no rubs, no gallops Respiratory: CTA bilaterally, no wheezing, no rhonchi Abdominal: Soft, NT, ND, bowel sounds + Extremities: no edema, no cyanosis    The results of significant diagnostics from this hospitalization (including imaging, microbiology, ancillary and laboratory) are listed below for reference.     Microbiology: No results found for this or any previous visit (from the past 240 hours).   Labs: BNP (last 3 results) Recent Labs    06/03/24 1128  BNP 148.0*   Basic Metabolic Panel: No results for input(s): NA, K, CL, CO2, GLUCOSE, BUN, CREATININE, CALCIUM, MG, PHOS in the last 168 hours. Liver Function Tests: No results for input(s): AST, ALT, ALKPHOS, BILITOT, PROT, ALBUMIN in the last 168 hours. No results for input(s): LIPASE, AMYLASE in the last 168 hours. No results for input(s): AMMONIA in the last 168 hours. CBC: No results for input(s): WBC, NEUTROABS, HGB, HCT, MCV, PLT in the  last 168 hours. Cardiac Enzymes: No results for input(s): CKTOTAL, CKMB, CKMBINDEX, TROPONINI in the last 168 hours. BNP: Invalid input(s): POCBNP CBG: No results for input(s): GLUCAP in the last 168 hours. D-Dimer No results for input(s): DDIMER in the last 72 hours. Hgb A1c No results for input(s): HGBA1C in the last 72 hours. Lipid Profile No results for input(s): CHOL, HDL, LDLCALC, TRIG, CHOLHDL, LDLDIRECT in the last 72 hours. Thyroid function studies No results for input(s): TSH, T4TOTAL, T3FREE, THYROIDAB in the last 72 hours.  Invalid input(s): FREET3 Anemia work up No results for input(s): VITAMINB12, FOLATE, FERRITIN, TIBC, IRON, RETICCTPCT in the last 72 hours. Urinalysis    Component Value Date/Time   COLORURINE YELLOW 10/31/2024 1849   APPEARANCEUR HAZY (A) 10/31/2024 1849   LABSPEC 1.012 10/31/2024 1849   PHURINE 5.0 10/31/2024 1849   GLUCOSEU NEGATIVE 10/31/2024 1849   HGBUR NEGATIVE 10/31/2024 1849   BILIRUBINUR NEGATIVE 10/31/2024 1849   KETONESUR NEGATIVE 10/31/2024 1849   PROTEINUR >=300 (A) 10/31/2024 1849   NITRITE NEGATIVE 10/31/2024 1849   LEUKOCYTESUR NEGATIVE 10/31/2024 1849   Sepsis Labs No results for input(s): WBC in the last 168 hours.  Invalid input(s): PROCALCITONIN, LACTICIDVEN Microbiology No results found for this or any previous visit (from the past 240 hours).   Time coordinating discharge: Over 30 minutes  SIGNED:   Darcel Dawley, MD  [facility] 12/01/2024, 1:36 PM Pager   If 7PM-7AM, please contact night-coverage     [1]  Allergies Allergen Reactions   Codeine Other (See Comments)    Insomnia

## 2024-12-01 NOTE — Progress Notes (Addendum)
 "                                                                                                                                                                                                          Daily Progress Note   Patient Name: [name]       Date: 12/01/2024 DOB: 19-Jul-1927  Age: 89 y.o. MRN#: [id number] Attending Physician: Leotis Bogus, MD Primary Care Physician: Okey Carlin Redbird, MD Admit Date: 11/14/2024  Reason for Follow-up: Non pain symptom management and Pain control  Patient Profile/HPI:  89 year old female with past medical history significant of CHF, cognitive dysfunction, interstitial lung disease, A-fib, presented to the ED on 11/14/2024 from a skilled nursing facility due to aggressive behaviors. ED providers discussed care with patient's family and plan was determined to be comfort care and hospice. Hospice has been consulted. Unfortunately, patient does not qualify for inpatient hospice. Transition of care is working on placement. Palliative medicine consulted for assistance with symptom management and placement options. She is admitted for end of life care.   Discussion: Chart reviewed- patient receiving her scheduled oxycodone , being held intermittently per her request. She received IV hydromorphone  0.5mg  x 1 yesterday. She is complaining of low back pain, it is chronic in nature.  Discussed with attending Dr. Leotis and hospice liaison- she has been approved for [facility] bed.   Review of Systems  Musculoskeletal:  Positive for back pain.     Physical Exam Vitals and nursing note reviewed.  Constitutional:      Appearance: She is ill-appearing.     Comments: cachetic  Cardiovascular:     Rate and Rhythm: Normal rate.  Pulmonary:     Effort: Pulmonary effort is normal.  Neurological:     Mental Status: She is alert.             Vital Signs: BP (!) 184/87 (BP Location: Right Arm)   Pulse 73   Temp 98.1 F (36.7 C)   Resp 15   Ht 5' 1  (1.549 m)   Wt 47.2 kg   SpO2 97%   BMI 19.65 kg/m  SpO2: SpO2: 97 % O2 Device: O2 Device: Room Air O2 Flow Rate: O2 Flow Rate (L/min): 2 L/min  Intake/output summary:  Intake/Output Summary (Last 24 hours) at 12/01/2024 1417 Last data filed at 12/01/2024 1100 Gross per 24 hour  Intake 310 ml  Output 400 ml  Net -90 ml   LBM:   Baseline Weight: Weight: 47.2 kg Most recent weight: Weight: 47.2 kg  Palliative Assessment/Data: PPS: 20%      Patient Active Problem List   Diagnosis Date Noted   Failure to thrive in adult 11/29/2024   Acute pulmonary edema (HCC) 11/19/2024   Aggressive behavior 11/19/2024   Acute hypoxic respiratory failure (HCC) 11/02/2024   Acute on chronic heart failure with preserved ejection fraction (HFpEF) (HCC) 11/01/2024   ILD (interstitial lung disease) (HCC) 11/01/2024   MCI (mild cognitive impairment) 11/01/2024   DNR (do not resuscitate) 11/01/2024   Hypothyroidism 11/01/2024   Dyslipidemia 11/01/2024   Essential hypertension 11/01/2024   Elevated d-dimer 11/01/2024   Acute CHF (congestive heart failure) (HCC) 10/31/2024   Chronic kidney disease, stage 3b (HCC) 10/31/2024   New onset atrial fibrillation (HCC) 10/31/2024   Elevated troponin 10/31/2024   Aortic stenosis    Impacted cerumen of both ears 02/03/2024   Normocytic normochromic anemia 09/18/2023   S/P shoulder replacement, 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    Palliative Care Assessment & Plan    Assessment/Recommendations/Plan  Severe failure to thrive in the setting of multiple comorbidities, back pain- comfort measures only, continue IV hydromorphone  0.5mg  prn- requested RN to administer prior to transport to [facility] Status:   Code Status: Do not attempt resuscitation (DNR) - Comfort care   Prognosis:  < 2 weeks  Discharge Planning: Hospice facility    Thank you for allowing the Palliative Medicine Team to assist in the care of this patient.  Billing based on MDM:  High   Problems Addressed: One or more chronic illnesses with severe exacerbation, progression, or side effects of treatment.    Risks: Parenteral controlled substances   Kenetha Cozza, AGNP-C Palliative Medicine   Please contact Palliative Medicine Team phone at [phone] for questions and concerns.        "

## 2024-12-01 NOTE — Progress Notes (Signed)
 [id number] [facility] Liaison Note  Received request from Care Manager for family interest in [facility].  Eligibility confirmed.  Spoke with nephew [name] to confirm interest and explain services. Per Care Manager, Nat Chang is the legal guardian and will sign the consents.  Financial Risk Analyst has reached out to [employer] for completion of consents.  Once completed, transfer to [facility] can be arranged.  Please call with any questions or concerns,   Thank you Inocente Jacobs RN BSN [facility] Liaison  [phone]

## 2024-12-01 NOTE — Progress Notes (Signed)
 " PROGRESS NOTE    [name]  FMW:[id number] DOB: 01-14-1927 DOA: 11/14/2024 PCP: Okey Carlin Redbird, MD   Brief Narrative: This 89 y.o. female with PMH significant for chronic diastolic CHF, cognitive dysfunction, Interstitial lung disease, A-fib not on anticoagulation, CKD stage IIIb, GERD, hyperlipidemia, hypertension, osteoarthritis, sleep disturbance, aortic stenosis, vitamin D  deficiency was brought in the ED from SNF due to aggressive behaviors.  EDP discussed the case with patient's family and the plan was determined to be comfort care and hospice.  Palliative care and hospice has been consulted,  Unfortunately patient does not qualify for inpatient hospice.  Transition of care is working on the placement.  Palliative care is helping with symptom management. Patient has decreased oral intake.  She has stayed in the ED for more than 14 days, so for ED staff unable to manage her agitation.    Patient was recently admitted in [facility] from 10/31/24 to 11/08/2024.  She lives alone and she was found down by the neighbors.  Hospital course complicated by agitation and delirium.  Given age and guarded prognosis palliative care was consulted and care transitioned to full comfort care.    Nephew changed his mind and rescinded comfort care and hospice.  Patient was discharged to skilled nursing facility.  She is admitted this time for placement in a hospice facility.  There is no available beds in [facility] today.  Assessment & Plan:   Principal Problem:   Failure to thrive in adult Active Problems:   Aortic stenosis   Chronic kidney disease, stage 3b (HCC)   New onset atrial fibrillation (HCC)   ILD (interstitial lung disease) (HCC)   Hypothyroidism   Dyslipidemia   Essential hypertension   Acute pulmonary edema (HCC)   Aggressive behavior   Failure to thrive: Patient was sent from skilled nursing facility for agitation / confusion. Patient lives alone, her niece and nephew lives in  [place] . EDP discussed the case with patient's family and the plan was determined to be comfort care and hospice.   Palliative care and hospice has been consulted,  Unfortunately patient does not qualify for inpatient hospice.   Transition of care is working on the placement.  Palliative care is helping with symptom management. Patient has very poor oral intake, remains agitated and uncooperative. She needs admission for placement in hospice facility. Care transitioned to full comfort care. Continued Zofran  and glycopyrrolate  for excessive secretions. Continue adequate pain control.   Agitation /aggressive behavior: Continue lorazepam  and Haldol  as needed.   Chronic medical problems: Aortic stenosis: CKD stage III: Interstitial lung disease: Hypothyroidism: Dyslipidemia: Essential hypertension: Care transitioned to full comfort care. She is admitted this time for placement in a hospice facility.  There is no available beds in [facility] today.   DVT prophylaxis: SCDs Code Status: DNR/Comfort care Family Communication:No family at bed side. Disposition Plan:    Status is: Inpatient Remains inpatient appropriate because: Patient is admitted for placement to inpatient hospice facility.   Patient is medically ready for discharge.  TOC is aware for placement,   Consultants:  Palliative care  Procedures:  Antimicrobials:  Anti-infectives (From admission, onward)    None       Subjective: Patient was seen and examined at bedside.  Overnight events noted. Patient is severely deconditioned, cachectic, frail, but comfortable,  lying in bed. She slept well last night, RN denies any concerns overnight  Objective: Vitals:   11/29/24 1619 11/30/24 0635 12/01/24 0558 12/01/24 1307  BP: ROLLEN)  149/69 138/69 (!) 165/87 (!) 184/87  Pulse: 86 77 68 73  Resp: 20 18 18 15   Temp: 98.1 F (36.7 C) 98.2 F (36.8 C) 97.6 F (36.4 C) 98.1 F (36.7 C)  TempSrc: Oral Oral Oral    SpO2: 97% 93% 98% 97%  Weight:      Height:        Intake/Output Summary (Last 24 hours) at 12/01/2024 1312 Last data filed at 12/01/2024 1100 Gross per 24 hour  Intake 310 ml  Output 400 ml  Net -90 ml   Filed Weights   11/14/24 1731  Weight: 47.2 kg    Examination:  General exam: Appears calm and comfortable, cachectic, frail, not in any acute distress. Respiratory system: Clear to auscultation. Respiratory effort normal. RR 14 Cardiovascular system: S1 & S2 heard, RRR. No JVD, murmurs, rubs, gallops or clicks.  Gastrointestinal system: Abdomen is non distended, soft and non tender.  Normal bowel sounds heard. Central nervous system: Alert and oriented X 1. No focal neurological deficits. Extremities: No edema, no cyanosis, no clubbing. Skin: No rashes, lesions or ulcers Psychiatry: Judgement and insight appear normal. Mood & affect appropriate.     Data Reviewed: I have personally reviewed following labs and imaging studies  CBC: No results for input(s): WBC, NEUTROABS, HGB, HCT, MCV, PLT in the last 168 hours. Basic Metabolic Panel: No results for input(s): NA, K, CL, CO2, GLUCOSE, BUN, CREATININE, CALCIUM, MG, PHOS in the last 168 hours. GFR: Estimated Creatinine Clearance: 20.7 mL/min (A) (by C-G formula based on SCr of 1.21 mg/dL (H)). Liver Function Tests: No results for input(s): AST, ALT, ALKPHOS, BILITOT, PROT, ALBUMIN in the last 168 hours. No results for input(s): LIPASE, AMYLASE in the last 168 hours. No results for input(s): AMMONIA in the last 168 hours. Coagulation Profile: No results for input(s): INR, PROTIME in the last 168 hours. Cardiac Enzymes: No results for input(s): CKTOTAL, CKMB, CKMBINDEX, TROPONINI in the last 168 hours. BNP (last 3 results) Recent Labs    06/03/24 1127 10/31/24 1811 11/14/24 2226  PROBNP 1,988* 10,738.0* 12,364.0*   HbA1C: No results for input(s): HGBA1C  in the last 72 hours. CBG: No results for input(s): GLUCAP in the last 168 hours. Lipid Profile: No results for input(s): CHOL, HDL, LDLCALC, TRIG, CHOLHDL, LDLDIRECT in the last 72 hours. Thyroid Function Tests: No results for input(s): TSH, T4TOTAL, FREET4, T3FREE, THYROIDAB in the last 72 hours. Anemia Panel: No results for input(s): VITAMINB12, FOLATE, FERRITIN, TIBC, IRON, RETICCTPCT in the last 72 hours. Sepsis Labs: No results for input(s): PROCALCITON, LATICACIDVEN in the last 168 hours.  No results found for this or any previous visit (from the past 240 hours).   Radiology Studies: No results found.  Scheduled Meds:  Chlorhexidine  Gluconate Cloth  6 each Topical Daily   docusate sodium   100 mg Oral BID   OLANZapine  zydis  5 mg Oral QHS   oxyCODONE   2.5 mg Oral Q6H   senna  1 tablet Oral BID   sodium chloride  flush  3 mL Intravenous Q12H   Continuous Infusions:     LOS: 2 days    Time spent: 35 mins    Darcel Dawley, MD [facility]   If 7PM-7AM, please contact night-coverage  "

## 2024-12-01 NOTE — TOC Transition Note (Signed)
 Transition of Care [facility]) - Discharge Note   Patient Details  Name: [name] MRN: [id number] Date of Birth: [birth year]  Transition of Care [facility] CM/SW Contact:  Toy LITTIE Agar, RN Phone Number:[phone]  12/01/2024, 2:47 PM   Clinical Narrative:    CM received message from [facility] stating that patient can discharge to [facility] today once consents are completed. CM spoke with Nat Chang Sw at DSS [phone]) to update on the plan. Per Nat DSS has an emergency order for guardianship and family is not able to sign consents for placement. CM has updated [facility]/. Per Inocente with [facility] the SW from [facility] will reach out to Nat Chang for consent.          Patient Goals and CMS Choice            Discharge Placement                       Discharge Plan and Services Additional resources added to the After Visit Summary for                                       Social Drivers of Health (SDOH) Interventions SDOH Screenings   Food Insecurity: Patient Declined (11/29/2024)  Housing: Unknown (11/29/2024)  Transportation Needs: Patient Unable To Answer (11/29/2024)  Utilities: Patient Unable To Answer (11/29/2024)  Social Connections: Patient Unable To Answer (11/29/2024)  Tobacco Use: Low Risk (11/14/2024)     Readmission Risk Interventions     No data to display

## 2024-12-02 ENCOUNTER — Other Ambulatory Visit: Payer: Self-pay

## 2024-12-02 NOTE — Plan of Care (Signed)
" °  Problem: Pain Management: Goal: Satisfaction with pain management regimen will improve Outcome: Progressing   Problem: Pain Managment: Goal: General experience of comfort will improve and/or be controlled Outcome: Progressing   Problem: Safety: Goal: Ability to remain free from injury will improve Outcome: Progressing   Problem: Skin Integrity: Goal: Risk for impaired skin integrity will decrease Outcome: Progressing   "

## 2024-12-02 NOTE — Plan of Care (Signed)
" °  Problem: Education: Goal: Knowledge of the prescribed therapeutic regimen will improve Outcome: Progressing   Problem: Coping: Goal: Ability to identify and develop effective coping behavior will improve Outcome: Progressing   Problem: Clinical Measurements: Goal: Quality of life will improve Outcome: Progressing   Problem: Respiratory: Goal: Verbalizations of increased ease of respirations will increase Outcome: Progressing   Problem: Role Relationship: Goal: Family's ability to cope with current situation will improve Outcome: Progressing Goal: Ability to verbalize concerns, feelings, and thoughts to partner or family member will improve Outcome: Progressing   Problem: Pain Management: Goal: Satisfaction with pain management regimen will improve Outcome: Progressing   Problem: Health Behavior/Discharge Planning: Goal: Ability to manage health-related needs will improve Outcome: Progressing   Problem: Clinical Measurements: Goal: Ability to maintain clinical measurements within normal limits will improve Outcome: Progressing Goal: Will remain free from infection Outcome: Progressing Goal: Diagnostic test results will improve Outcome: Progressing Goal: Respiratory complications will improve Outcome: Progressing Goal: Cardiovascular complication will be avoided Outcome: Progressing   "

## 2024-12-02 NOTE — Progress Notes (Signed)
 " PROGRESS NOTE    [name]  FMW:[id number] DOB: 04-Jul-1927 DOA: 11/14/2024 PCP: Okey Carlin Redbird, MD   Brief Narrative: This 89 y.o. female with PMH significant for chronic diastolic CHF, cognitive dysfunction, Interstitial lung disease, A-fib not on anticoagulation, CKD stage IIIb, GERD, hyperlipidemia, hypertension, osteoarthritis, sleep disturbance, aortic stenosis, vitamin D  deficiency was brought in the ED from SNF due to aggressive behaviors.  EDP discussed the case with patient's family and the plan was determined to be comfort care and hospice.  Palliative care and hospice has been consulted,  Unfortunately patient does not qualify for inpatient hospice.  Transition of care is working on the placement.  Palliative care is helping with symptom management. Patient has decreased oral intake.  She has stayed in the ED for more than 14 days, so for ED staff unable to manage her agitation.    Patient was recently admitted in [facility] from 10/31/24 to 11/08/2024.  She lives alone and she was found down by the neighbors.  Hospital course complicated by agitation and delirium.  Given age and guarded prognosis palliative care was consulted and care transitioned to full comfort care.    Nephew changed his mind and rescinded comfort care and hospice.  Patient was discharged to skilled nursing facility.  She is admitted this time for placement in a hospice facility.  Patient was discharged yesterday to [facility] but consents has to be signed.  Assessment & Plan:   Principal Problem:   Failure to thrive in adult Active Problems:   Aortic stenosis   Chronic kidney disease, stage 3b (HCC)   New onset atrial fibrillation (HCC)   ILD (interstitial lung disease) (HCC)   Hypothyroidism   Dyslipidemia   Essential hypertension   Acute pulmonary edema (HCC)   Aggressive behavior   Failure to thrive: Patient was sent from skilled nursing facility for agitation / confusion. Patient lives alone,  her niece and nephew lives in [place] . EDP discussed the case with patient's family and the plan was determined to be comfort care and hospice.   Palliative care and hospice has been consulted,  Unfortunately patient does not qualify for inpatient hospice.   Transition of care is working on the placement.  Palliative care is helping with symptom management. Patient has very poor oral intake, remains agitated and uncooperative. She needs admission for placement in hospice facility. Care transitioned to full comfort care. Continued Zofran  and glycopyrrolate  for excessive secretions. Continue adequate pain control.   Agitation /aggressive behavior: Continue lorazepam  and Haldol  as needed.   Chronic medical problems: Aortic stenosis: CKD stage III: Interstitial lung disease: Hypothyroidism: Dyslipidemia: Essential hypertension: Care transitioned to full comfort care. She is admitted this time for placement in a hospice facility.  She was discharged to [facility] yesterday but consent has to be signed.   DVT prophylaxis: SCDs Code Status: DNR/Comfort care Family Communication:No family at bed side. Disposition Plan:    Status is: Inpatient Remains inpatient appropriate because: Patient is admitted for placement to inpatient hospice facility.   Patient is medically ready for discharge.     Consultants:  Palliative care  Procedures:  Antimicrobials:  Anti-infectives (From admission, onward)    None       Subjective: Patient was seen and examined at bedside.  Overnight events noted. Patient is severely deconditioned, cachectic, frail, but comfortable,  lying in bed. She slept well last night, RN denies any concerns overnight. She was discharged yesterday to [facility] but consent has to be  signed.  Objective: Vitals:   11/30/24 0635 12/01/24 0558 12/01/24 1307 12/02/24 0549  BP: 138/69 (!) 165/87 (!) 184/87 134/69  Pulse: 77 68 73 69  Resp: 18 18 15 18    Temp: 98.2 F (36.8 C) 97.6 F (36.4 C) 98.1 F (36.7 C) 97.7 F (36.5 C)  TempSrc: Oral Oral  Oral  SpO2: 93% 98% 97% 100%  Weight:    47.1 kg  Height:    5' 1 (1.549 m)    Intake/Output Summary (Last 24 hours) at 12/02/2024 1342 Last data filed at 12/02/2024 1014 Gross per 24 hour  Intake 3 ml  Output 375 ml  Net -372 ml   Filed Weights   11/14/24 1731 12/02/24 0549  Weight: 47.2 kg 47.1 kg    Examination:  General exam: Appears calm and comfortable, cachectic, frail, not in any acute distress. Respiratory system: Clear to auscultation. Respiratory effort normal. RR 14 Cardiovascular system: S1 & S2 heard, RRR. No JVD, murmurs, rubs, gallops or clicks.  Gastrointestinal system: Abdomen is non distended, soft and non tender.  Normal bowel sounds heard. Central nervous system: Alert and oriented X 1. No focal neurological deficits. Extremities: No edema, no cyanosis, no clubbing. Skin: No rashes, lesions or ulcers Psychiatry: Judgement and insight appear normal. Mood & affect appropriate.     Data Reviewed: I have personally reviewed following labs and imaging studies  CBC: No results for input(s): WBC, NEUTROABS, HGB, HCT, MCV, PLT in the last 168 hours. Basic Metabolic Panel: No results for input(s): NA, K, CL, CO2, GLUCOSE, BUN, CREATININE, CALCIUM, MG, PHOS in the last 168 hours. GFR: Estimated Creatinine Clearance: 20.7 mL/min (A) (by C-G formula based on SCr of 1.21 mg/dL (H)). Liver Function Tests: No results for input(s): AST, ALT, ALKPHOS, BILITOT, PROT, ALBUMIN in the last 168 hours. No results for input(s): LIPASE, AMYLASE in the last 168 hours. No results for input(s): AMMONIA in the last 168 hours. Coagulation Profile: No results for input(s): INR, PROTIME in the last 168 hours. Cardiac Enzymes: No results for input(s): CKTOTAL, CKMB, CKMBINDEX, TROPONINI in the last 168 hours. BNP (last 3  results) Recent Labs    06/03/24 1127 10/31/24 1811 11/14/24 2226  PROBNP 1,988* 10,738.0* 12,364.0*   HbA1C: No results for input(s): HGBA1C in the last 72 hours. CBG: No results for input(s): GLUCAP in the last 168 hours. Lipid Profile: No results for input(s): CHOL, HDL, LDLCALC, TRIG, CHOLHDL, LDLDIRECT in the last 72 hours. Thyroid Function Tests: No results for input(s): TSH, T4TOTAL, FREET4, T3FREE, THYROIDAB in the last 72 hours. Anemia Panel: No results for input(s): VITAMINB12, FOLATE, FERRITIN, TIBC, IRON, RETICCTPCT in the last 72 hours. Sepsis Labs: No results for input(s): PROCALCITON, LATICACIDVEN in the last 168 hours.  No results found for this or any previous visit (from the past 240 hours).   Radiology Studies: No results found.  Scheduled Meds:  Chlorhexidine  Gluconate Cloth  6 each Topical Daily   docusate sodium   100 mg Oral BID   OLANZapine  zydis  5 mg Oral QHS   oxyCODONE   2.5 mg Oral Q6H   senna  1 tablet Oral BID   sodium chloride  flush  3 mL Intravenous Q12H   Continuous Infusions:     LOS: 3 days    Time spent: 35 mins    Darcel Dawley, MD [facility]   If 7PM-7AM, please contact night-coverage  "

## 2024-12-02 NOTE — TOC Transition Note (Signed)
 Transition of Care [facility]) - Discharge Note   Patient Details  Name: [name] MRN: [id number] Date of Birth: [birth year]  Transition of [facility] CM/SW Contact:  Toy LITTIE Agar, RN Phone Number:[phone]  12/02/2024, 2:39 PM   Clinical Narrative:    Consents have been signed for patient to discharge to [facility]. Transportation has been arranged per [facility].  No other needs noted CM will sign off.     Final next level of care: Hospice Medical Facility Barriers to Discharge: No Barriers Identified   Patient Goals and CMS Choice            Discharge Placement              Patient chooses bed at:  [facility]) Patient to be transferred to facility by: [facility] Name of family member notified: Nat Chang Sw guardian DSS Patient and family notified of of transfer: 12/01/24  Discharge Plan and Services Additional resources added to the After Visit Summary for                  DME Arranged: N/A DME Agency: NA       HH Arranged: NA HH Agency: NA        Social Drivers of Health (SDOH) Interventions SDOH Screenings   Food Insecurity: Patient Declined (11/29/2024)  Housing: Unknown (11/29/2024)  Transportation Needs: Patient Unable To Answer (11/29/2024)  Utilities: Patient Unable To Answer (11/29/2024)  Social Connections: Patient Unable To Answer (11/29/2024)  Tobacco Use: Low Risk (11/14/2024)     Readmission Risk Interventions     No data to display

## 2024-12-02 NOTE — Progress Notes (Signed)
 Patient left via carelink with personal belongings including clothes, glasses, and cell phone via stretcher in no acute distress

## 2024-12-02 NOTE — Progress Notes (Signed)
 Darryle Law [address]  Hospice hospital liaison note   [facility] is able to accept patient this afternoon.   RN staff, you may call report at any time to [phone] BP, room is assigned when report is called.  Please leave IV intact and send completed DNR with patient.   Updated attending and [facility] manager via [employer].  Thank you for the opportunity to participate in this patient's care.  Amy Darien BSN, RN [facility] Liaison [phone]

## 2025-01-17 ENCOUNTER — Ambulatory Visit: Admitting: Podiatry

## 2025-02-08 ENCOUNTER — Ambulatory Visit (INDEPENDENT_AMBULATORY_CARE_PROVIDER_SITE_OTHER): Admitting: Otolaryngology

## 2025-06-09 ENCOUNTER — Other Ambulatory Visit (HOSPITAL_COMMUNITY)
